# Patient Record
Sex: Female | Born: 1995 | Race: Black or African American | Hispanic: No | Marital: Single | Smoking: Current every day smoker
Health system: Southern US, Community
[De-identification: ages and names within clinical notes are randomized; demographics above are authoritative.]

## PROBLEM LIST (undated history)

## (undated) ENCOUNTER — Inpatient Hospital Stay (HOSPITAL_COMMUNITY): Payer: Self-pay

## (undated) DIAGNOSIS — Z789 Other specified health status: Secondary | ICD-10-CM

## (undated) HISTORY — PX: WISDOM TOOTH EXTRACTION: SHX21

---
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
## 2014-01-17 ENCOUNTER — Ambulatory Visit: Payer: Medicaid Other | Admitting: Pediatrics

## 2014-03-01 ENCOUNTER — Emergency Department (HOSPITAL_COMMUNITY): Payer: Medicaid Other

## 2014-03-01 ENCOUNTER — Emergency Department (HOSPITAL_COMMUNITY)
Admission: EM | Admit: 2014-03-01 | Discharge: 2014-03-01 | Disposition: A | Discharge status: Home / Self Care | Payer: Medicaid Other | Attending: Emergency Medicine | Admitting: Emergency Medicine

## 2014-03-01 ENCOUNTER — Encounter (HOSPITAL_COMMUNITY): Payer: Self-pay | Admitting: Emergency Medicine

## 2014-03-01 DIAGNOSIS — S99922A Unspecified injury of left foot, initial encounter: Secondary | ICD-10-CM | POA: Diagnosis present

## 2014-03-01 DIAGNOSIS — S92002A Unspecified fracture of left calcaneus, initial encounter for closed fracture: Secondary | ICD-10-CM | POA: Diagnosis not present

## 2014-03-01 DIAGNOSIS — Y998 Other external cause status: Secondary | ICD-10-CM | POA: Insufficient documentation

## 2014-03-01 DIAGNOSIS — Y9389 Activity, other specified: Secondary | ICD-10-CM | POA: Diagnosis not present

## 2014-03-01 DIAGNOSIS — Y9289 Other specified places as the place of occurrence of the external cause: Secondary | ICD-10-CM | POA: Diagnosis not present

## 2014-03-01 DIAGNOSIS — Z792 Long term (current) use of antibiotics: Secondary | ICD-10-CM | POA: Diagnosis not present

## 2014-03-01 DIAGNOSIS — R52 Pain, unspecified: Secondary | ICD-10-CM

## 2014-03-01 MED ORDER — HYDROCODONE-ACETAMINOPHEN 5-325 MG PO TABS: 1.0000 | ORAL_TABLET | ORAL | Status: DC | PRN

## 2014-03-01 MED ORDER — NAPROXEN 250 MG PO TABS
500.0000 mg | ORAL_TABLET | Freq: Once | ORAL | Status: DC
  Filled 2014-03-01: qty 2

## 2014-03-01 MED ORDER — HYDROCODONE-ACETAMINOPHEN 5-325 MG PO TABS
1.0000 | ORAL_TABLET | Freq: Once | ORAL | Status: AC
  Administered 2014-03-01: 1 via ORAL
  Filled 2014-03-01: qty 1

## 2014-03-01 MED ORDER — NAPROXEN 500 MG PO TABS
500.0000 mg | ORAL_TABLET | Freq: Two times a day (BID) | ORAL | Status: DC
Start: 2014-03-01 — End: 2014-10-07
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
## 2014-03-01 NOTE — ED Notes (Signed)
Ortho to apply splint to lt.

## 2014-03-01 NOTE — Progress Notes (Signed)
Orthopedic Tech Progress Note Patient Details:  Tracy BrunsJehnya L [name] 03/12/1996 [id number]  Ortho Devices Type of Ortho Device: Ace wrap, Lenora BoysWatson Jones splint, Crutches Ortho Device/Splint Location: LLE Ortho Device/Splint Interventions: Application   Asia R Thompson 03/01/2014, 11:41 AM

## 2014-03-01 NOTE — ED Notes (Signed)
Declined W/C at D/C and was escorted to lobby by RN. 

## 2014-03-01 NOTE — Discharge Instructions (Signed)
Follow directions provided. Be sure to establish care and follow-up with Dr. Dion SaucierLandau to follow-up with your healed fracture. Take the pain medicine as directed. Don't hesitate to return for any new, worsening, or concerning symptoms.  SEEK IMMEDIATE MEDICAL CARE IF:  You have fluid leaking through the cast.  You are unable to move your fingers or toes.  You have discolored (blue or white), cool, painful, or very swollen fingers or toes beyond the cast.  You have tingling or numbness around the injured area.  You have severe pain or pressure under the cast.  You have any difficulty with your breathing or have shortness of breath.  You have chest pain.

## 2014-03-03 ENCOUNTER — Emergency Department (HOSPITAL_COMMUNITY)
Admission: EM | Admit: 2014-03-03 | Discharge: 2014-03-03 | Disposition: A | Discharge status: Home / Self Care | Payer: Medicaid Other | Attending: Emergency Medicine | Admitting: Emergency Medicine

## 2014-03-03 ENCOUNTER — Encounter (HOSPITAL_COMMUNITY): Payer: Self-pay | Admitting: Family Medicine

## 2014-03-03 DIAGNOSIS — Z3202 Encounter for pregnancy test, result negative: Secondary | ICD-10-CM | POA: Insufficient documentation

## 2014-03-03 DIAGNOSIS — Z3200 Encounter for pregnancy test, result unknown: Secondary | ICD-10-CM

## 2014-03-03 DIAGNOSIS — B86 Scabies: Secondary | ICD-10-CM | POA: Diagnosis not present

## 2014-03-03 LAB — URINE MICROSCOPIC-ADD ON

## 2014-03-03 LAB — URINALYSIS, ROUTINE W REFLEX MICROSCOPIC
GLUCOSE, UA: NEGATIVE mg/dL
KETONES UR: 15 mg/dL — AB
Nitrite: NEGATIVE
PH: 5.5 (ref 5.0–8.0)
Protein, ur: 100 mg/dL — AB
SPECIFIC GRAVITY, URINE: 1.027 (ref 1.005–1.030)
Urobilinogen, UA: 0.2 mg/dL (ref 0.0–1.0)

## 2014-03-03 LAB — POC URINE PREG, ED: Preg Test, Ur: NEGATIVE

## 2014-03-03 MED ORDER — PERMETHRIN 5 % EX CREA: TOPICAL_CREAM | CUTANEOUS | Status: DC

## 2014-03-03 NOTE — ED Provider Notes (Signed)
CSN: [id number]     Arrival date & time 03/03/14  1215 History   First MD Initiated Contact with Patient 03/03/14 1232     Chief Complaint  Patient presents with  . Possible Pregnancy     (Consider location/radiation/quality/duration/timing/severity/associated sxs/prior Treatment) HPI Comments: Patient presents today requesting a pregnancy test.  She states that she took one at home one week ago, which was negative.  Her LMP was approximately 3 months ago.  She denies any abdominal pain, pelvic pain, urinary symptoms, vaginal discharge, fever, chills, nausea, vomiting, or diarrhea.  She is sexually active and has had unprotected sex.  She also reports that she has had a pruritic rash for the past 3 weeks.  Rash located in the web spaces of both hands, lateral lower back, and bilateral groin area.  She has not treated the rash with any medications prior to arrival.  No known contacts with similar rash.  No new soaps, detergents, lotions, or medications.  No swelling of the lips, tongue, or throat.  No SOB.  The history is provided by the patient.    Past Medical History  Diagnosis Date  . Medical history non-contributory    Past Surgical History  Procedure Laterality Date  . No past surgeries     History reviewed. No pertinent family history. History  Substance Use Topics  . Smoking status: Never Smoker   . Smokeless tobacco: Never Used  . Alcohol Use: No   OB History    No data available     Review of Systems  Skin: Positive for rash.  All other systems reviewed and are negative.     Allergies  Review of patient's allergies indicates no known allergies.  Home Medications   Prior to Admission medications   Medication Sig Start Date End Date Taking? Authorizing Provider  ciprofloxacin (CIPRO) 500 MG tablet Take 1 tablet (500 mg total) by mouth 2 (two) times daily. Patient not taking: Reported on 03/03/2014 12/31/13   Marny LowensteinJulie N Wenzel, PA-C  doxycycline (VIBRAMYCIN) 100  MG capsule Take 1 capsule (100 mg total) by mouth 2 (two) times daily. Patient not taking: Reported on 03/03/2014 12/26/13   Garlon HatchetLisa M Sanders, PA-C  HYDROcodone-acetaminophen (NORCO/VICODIN) 5-325 MG per tablet Take 1-2 tablets by mouth every 4 (four) hours as needed for moderate pain or severe pain. Patient not taking: Reported on 03/03/2014 03/01/14   Harle BattiestElizabeth Tysinger, NP  naproxen (NAPROSYN) 500 MG tablet Take 1 tablet (500 mg total) by mouth 2 (two) times daily with a meal. Patient not taking: Reported on 03/03/2014 03/01/14   Harle BattiestElizabeth Tysinger, NP   BP 115/70 mmHg  Pulse 89  Temp(Src) 98.6 F (37 C) (Oral)  Resp 18  Ht 5\' 2"  (1.575 m)  Wt 109 lb (49.442 kg)  BMI 19.93 kg/m2  SpO2 100%  LMP 12/02/2013 Physical Exam  Constitutional: She appears well-developed and well-nourished.  HENT:  Head: Normocephalic and atraumatic.  Mouth/Throat: Oropharynx is clear and moist.  Neck: Normal range of motion. Neck supple.  Cardiovascular: Normal rate, regular rhythm and normal heart sounds.   Pulmonary/Chest: Effort normal and breath sounds normal.  Abdominal: Soft. Bowel sounds are normal. She exhibits no distension and no mass. There is no tenderness. There is no rebound and no guarding.  Neurological: She is alert.  Skin: Skin is warm and dry.  Erythematous papular pruritic rash located in the groin area bilaterally and the web spaces of the fingers.  Also periumbilical and lateral lower back.  Psychiatric: She  has a normal mood and affect.  Nursing note and vitals reviewed.   ED Course  Procedures (including critical care time) Labs Review Labs Reviewed  URINALYSIS, ROUTINE W REFLEX MICROSCOPIC  POC URINE PREG, ED    Imaging Review No results found.   EKG Interpretation None      MDM   Final diagnoses:  None   Patient presents today requesting a pregnancy test and also a rash.  Pregnancy test today is negative.  She denies abdominal pain, pelvic pain, or vaginal  discharge.  She also has a pruritic rash located in the groin area bilaterally, web spaces of fingers, and lateral lower back.  Appearance and location most consistent with Scabies.  Feel discharged home with Rx for Permethrin cream.  Stable for discharge.  Return precautions given.      Santiago GladHeather Zekiah Coen, PA-C 03/03/14 2207  Juliet RudeNathan R. Rubin PayorPickering, MD 03/04/14 [phone]

## 2014-03-03 NOTE — ED Notes (Signed)
Pt sts she thinks she is pregnant. sts hasn't had a cycle in a few months. Denise any problems. sts also rash to bottom.

## 2014-03-05 LAB — URINE CULTURE: Colony Count: >=100000

## 2014-03-06 NOTE — Progress Notes (Signed)
ED Antimicrobial Stewardship Positive Culture Follow Up   Tracy BrunsJehnya L Allaire is an 18 y.o. female who [facility] with a chief complaint of  Chief Complaint  Patient presents with  . Possible Pregnancy    Recent Results (from the past 720 hour(s))  Urine culture     Status: None   Collection Time: 03/03/14  1:32 PM  Result Value Ref Range Status   Specimen Description URINE, CLEAN CATCH  Final   Special Requests NONE  Final   Culture  Setup Time   Final    03/03/2014 13:57 Performed at [facility]    Colony Count   Final    >=100,000 COLONIES/ML Performed at [facility]    Culture   Final    ESCHERICHIA COLI Performed at [facility]    Report Status 03/05/2014 FINAL  Final   Organism ID, Bacteria ESCHERICHIA COLI  Final      Susceptibility   Escherichia coli - MIC*    AMPICILLIN >=32 RESISTANT Resistant     CEFAZOLIN <=4 SENSITIVE Sensitive     CEFTRIAXONE <=1 SENSITIVE Sensitive     CIPROFLOXACIN <=0.25 SENSITIVE Sensitive     GENTAMICIN <=1 SENSITIVE Sensitive     LEVOFLOXACIN <=0.12 SENSITIVE Sensitive     NITROFURANTOIN <=16 SENSITIVE Sensitive     TOBRAMYCIN <=1 SENSITIVE Sensitive     TRIMETH/SULFA <=20 SENSITIVE Sensitive     PIP/TAZO <=4 SENSITIVE Sensitive     * ESCHERICHIA COLI    18yof presented with possible pregnancy. Pregnancy test was negative. Positive urine culture with Ecoli but UA revealed many epithelials and patient reported no UTI symptoms - most likely contamination, no treatment indicated.   ED Provider: Teressa LowerVrinda Pickering, FNP   Cleon Dewulaney,  Robert 03/06/2014, 1:30 PM Infectious Diseases Pharmacist Phone# [phone]

## 2014-05-19 ENCOUNTER — Emergency Department (HOSPITAL_COMMUNITY)
Admission: EM | Admit: 2014-05-19 | Discharge: 2014-05-19 | Disposition: A | Discharge status: Home / Self Care | Payer: Medicaid Other | Attending: Emergency Medicine | Admitting: Emergency Medicine

## 2014-05-19 ENCOUNTER — Encounter (HOSPITAL_COMMUNITY): Payer: Self-pay | Admitting: Emergency Medicine

## 2014-05-19 DIAGNOSIS — J029 Acute pharyngitis, unspecified: Secondary | ICD-10-CM | POA: Diagnosis present

## 2014-05-19 DIAGNOSIS — J028 Acute pharyngitis due to other specified organisms: Secondary | ICD-10-CM

## 2014-05-19 DIAGNOSIS — B9689 Other specified bacterial agents as the cause of diseases classified elsewhere: Secondary | ICD-10-CM

## 2014-05-19 MED ORDER — OXYCODONE HCL 5 MG/5ML PO SOLN
5.0000 mg | Freq: Once | ORAL | Status: AC
  Administered 2014-05-19: 5 mg via ORAL
  Filled 2014-05-19: qty 5

## 2014-05-19 MED ORDER — DEXAMETHASONE SODIUM PHOSPHATE 10 MG/ML IJ SOLN
10.0000 mg | Freq: Once | INTRAMUSCULAR | Status: AC
  Administered 2014-05-19: 10 mg via INTRAMUSCULAR
  Filled 2014-05-19: qty 1

## 2014-05-19 MED ORDER — OXYCODONE HCL 5 MG/5ML PO SOLN: 5.0000 mg | Freq: Four times a day (QID) | ORAL | Status: DC | PRN

## 2014-05-19 MED ORDER — PENICILLIN G BENZATHINE 1200000 UNIT/2ML IM SUSP
1.2000 10*6.[IU] | Freq: Once | INTRAMUSCULAR | Status: AC
  Administered 2014-05-19: 1.2 10*6.[IU] via INTRAMUSCULAR
  Filled 2014-05-19: qty 2

## 2014-05-19 MED ORDER — IBUPROFEN 400 MG PO TABS
800.0000 mg | ORAL_TABLET | Freq: Once | ORAL | Status: AC
  Administered 2014-05-19: 800 mg via ORAL
  Filled 2014-05-19: qty 2

## 2014-05-19 NOTE — Discharge Instructions (Signed)
1. Medications: Roxicodone, usual home medications 2. Treatment: rest, drink plenty of fluids, take tylenol or ibuprofen for fever control 3. Follow Up: Please followup with your primary doctor in 3 days for discussion of your diagnoses and further evaluation after today's visit; if you do not have a primary care doctor use the resource guide provided to find one; Return to the ER for high fevers, difficulty breathing, inability to swallow or other concerning symptoms   Pharyngitis Pharyngitis is redness, pain, and swelling (inflammation) of your pharynx.  CAUSES  Pharyngitis is usually caused by infection. Most of the time, these infections are from viruses (viral) and are part of a cold. However, sometimes pharyngitis is caused by bacteria (bacterial). Pharyngitis can also be caused by allergies. Viral pharyngitis may be spread from person to person by coughing, sneezing, and personal items or utensils (cups, forks, spoons, toothbrushes). Bacterial pharyngitis may be spread from person to person by more intimate contact, such as kissing.  SIGNS AND SYMPTOMS  Symptoms of pharyngitis include:   Sore throat.   Tiredness (fatigue).   Low-grade fever.   Headache.  Joint pain and muscle aches.  Skin rashes.  Swollen lymph nodes.  Plaque-like film on throat or tonsils (often seen with bacterial pharyngitis). DIAGNOSIS  Your health care provider will ask you questions about your illness and your symptoms. Your medical history, along with a physical exam, is often all that is needed to diagnose pharyngitis. Sometimes, a rapid strep test is done. Other lab tests may also be done, depending on the suspected cause.  TREATMENT  Viral pharyngitis will usually get better in 3-4 days without the use of medicine. Bacterial pharyngitis is treated with medicines that kill germs (antibiotics).  HOME CARE INSTRUCTIONS   Drink enough water and fluids to keep your urine clear or pale yellow.    Only take over-the-counter or prescription medicines as directed by your health care provider:   If you are prescribed antibiotics, make sure you finish them even if you start to feel better.   Do not take aspirin.   Get lots of rest.   Gargle with 8 oz of salt water ( tsp of salt per 1 qt of water) as often as every 1-2 hours to soothe your throat.   Throat lozenges (if you are not at risk for choking) or sprays may be used to soothe your throat. SEEK MEDICAL CARE IF:   You have large, tender lumps in your neck.  You have a rash.  You cough up green, yellow-brown, or bloody spit. SEEK IMMEDIATE MEDICAL CARE IF:   Your neck becomes stiff.  You drool or are unable to swallow liquids.  You vomit or are unable to keep medicines or liquids down.  You have severe pain that does not go away with the use of recommended medicines.  You have trouble breathing (not caused by a stuffy nose). MAKE SURE YOU:   Understand these instructions.  Will watch your condition.  Will get help right away if you are not doing well or get worse. Document Released: 02/28/2005 Document Revised: 12/19/2012 Document Reviewed: 11/05/2012 [facility] Patient Information 2015 [place], [place]. This information is not intended to replace advice given to you by your health care provider. Make sure you discuss any questions you have with your health care provider.    Emergency Department Resource Guide 1) Find a Doctor and Pay Out of Pocket Although you won't have to find out who is covered by your insurance plan, it is  a good idea to ask around and get recommendations. You will then need to call the office and see if the doctor you have chosen will accept you as a new patient and what types of options they offer for patients who are self-pay. Some doctors offer discounts or will set up payment plans for their patients who do not have insurance, but you will need to ask so you aren't surprised when you  get to your appointment.  2) Contact Your Local Health Department Not all health departments have doctors that can see patients for sick visits, but many do, so it is worth a call to see if yours does. If you don't know where your local health department is, you can check in your phone book. The CDC also has a tool to help you locate your state's health department, and many state websites also have listings of all of their local health departments.  3) Find a Walk-in Clinic If your illness is not likely to be very severe or complicated, you may want to try a walk in clinic. These are popping up all over the country in pharmacies, drugstores, and shopping centers. They're usually staffed by nurse practitioners or physician assistants that have been trained to treat common illnesses and complaints. They're usually fairly quick and inexpensive. However, if you have serious medical issues or chronic medical problems, these are probably not your best option.  No Primary Care Doctor: - Call Health Connect at  [phone] - they can help you locate a primary care doctor that  accepts your insurance, provides certain services, etc. - Physician Referral Service- [phone]  Chronic Pain Problems: Organization         Address  Phone   Notes  [facility]  [phone] Patients need to be referred by their primary care doctor.   Medication Assistance: Organization         Address  Phone   Notes  [facility] [address] [place], [place] [address] [phone] --Must be a resident of [facility] [place] -- Must have NO insurance coverage whatsoever (no Medicaid/ Medicare, etc.) -- The pt. MUST have a primary care doctor that directs their care regularly and follows them in the community   MedAssist  [phone]   [employer]  [phone]    Agencies that provide inexpensive medical care: Organization         Address  Phone    Notes  [facility]  [phone]   [facility]    [phone]   [facility] [address] [place], [place] [address] [phone]) [phone]   [facility] [address], [place] [phone]   [facility]anned Parenthood    [phone]   [facility]    [phone]   Community [facility] and [facility]  [address], [place] Phone:  [phone], Fax:  [phone]) [phone] Hours of Operation:  9 am - 6 pm, M-F.  Also accepts Medicaid/Medicare and self-pay.  [facility]  [address], [place] Phone: [phone]) [phone], Fax: [phone]) [phone]. Hours of Operation:  8:30 am - 5:30 pm, M-F.  Also accepts Medicaid and self-pay.  [facility] [place] [address], [place] Phone: [phone]) [phone]   [facility] [address], [place], [place] [phone], Ext. [phone] Mondays & Thursdays: 7-9 AM.  First 15 patients are seen on a first come, first serve basis.    Medicaid-accepting [facility] Providers:  Organization         Address  Phone   Notes  [facility] [address], [place] [phone] Also accepts self-pay patients.  [facility] [address] Laurell Josephs [place], [place]  [phone]   [facility] [address], [place] [phone]   [facility] [address], [place] [phone]   Renaye Rakers [address], [place]   [phone] Only accepts [place] Access [place] patients after they have their name applied to their card.   Self-Pay (no insurance) in [facility]:  Organization         Address  Phone   Notes  Sickle Cell Patients, [facility] [address] [place], [place] [phone]   [facility] [address] [place], [place] [phone]   [facility] [place]  [address], [place] [phone]   [facility]/Dr. Osei-Bonsu  [address], [place] or [address], [place] [phone] Phone number for both [place] and [place] locations is the same.  Urgent Medical and [facility] [address] [place] [phone]   [facility] [address] [place] or [address] [phone]   [facility] [address], [place] [phone], phone; [phone], fax Sees patients 1st and 3rd Saturday of every month.  Must not qualify for public or private insurance (i.e. Medicaid, Medicare, [place] Health Choice, Veterans' Benefits)  Household income should be no more than 200% of the poverty level The clinic cannot treat you if you are pregnant or think you are pregnant  Sexually transmitted diseases are not treated at the clinic.    Dental Care: Organization         Address  Phone  Notes  [facility] [address] [facility] of [facility] [address] [place], [place] [phone] Accepts children up to age 17 who are enrolled in [place] or [place] Health Choice; pregnant women with a Medicaid card; and children who have applied for Medicaid or [place] Health Choice, but were declined, whose parents can pay a reduced fee at time of service.  [facility]  [address], [place] [phone] Accepts children up to age 73 who are enrolled in [place] or [place] Health Choice; pregnant women with a Medicaid card; and children who have applied for Medicaid or [place] Health Choice, but were declined, whose parents can pay a reduced fee at time of service.  [facility]  [address] [place], [place] [phone] Patients are seen by appointment only. Walk-ins are not accepted. [facility] will see patients 62 years of age  and older. Monday - Tuesday (8am-5pm) Most Wednesdays (8:30-5pm) $30 per visit, cash only  [facility] Access PRO[facility]  [address], [facility] [phone] Patients are seen by appointment only. Walk-ins are not accepted. [facility] will see patients 37 years of age and older. One Wednesday Evening (Monthly: Volunteer Based).  $30 per visit, cash only  [employer]  [phone] for adults; Children under age 36, call Graduate  Pediatric Dentistry at [phone]. Children aged 44-14, please call [phone] to request a pediatric application.  Dental services are provided in all areas of dental care including fillings, crowns and bridges, complete and partial dentures, implants, gum treatment, root canals, and extractions. Preventive care is also provided. Treatment is provided to both adults and children. Patients are selected via a lottery and there is often a waiting list.   [facility] [address], [place]  [phone] [email or web address]   [facility] [address] [place], [place] [phone], Ext. [phone] Second and Fourth Thursday of each month, opens at 6:30 AM; Clinic ends at 9 AM.  Patients are seen on a first-come first-served basis, and a limited number are seen during each clinic.   [facility]  [address] Ether Griffins [place], [place] [phone]   Eligibility Requirements You must have lived in [place], [place], or [place] counties for at least the last three months.   You cannot be eligible for state or federal sponsored [place], including [employer], [place], or [employer].   You generally cannot be eligible for healthcare insurance through your employer.    How to apply: Eligibility screenings are held every Tuesday and Wednesday afternoon from 1:00 pm until 4:00 pm. You do not need an appointment for the interview!  [facility] [address], [place], [place] [phone]   [facility] Health Department  [phone]   [facility] Health Department  [phone]   [facility] Health Department  [phone]    Behavioral Health Resources in the Community: Intensive Outpatient Programs Organization         Address  Phone  Notes  [facility] [address] [place], [place] [phone]   [facility] Outpatient [address], [place], [place] [phone]   [facility] [address] [place], [place]  [phone]   [facility] [address],  [place], [place] [phone] or [phone]   Substance Abuse Resources Organization         Address  Phone  Notes  [facility] Drug Services  [phone]   [facility]  [phone]   [facility] [place]  [phone]   Floydene Flock  [phone]   Residential & Outpatient Substance Abuse Program  [phone]   Psychological Services Organization         Address  Phone  Notes  [facility]  [phone]   [facility]  [phone]   [facility] [address], [place] [phone] or [phone]    Mobile Crisis Teams Organization         Address  Phone  Notes  [facility], Mobile Crisis Care Unit  [phone]   [facility]  [address] [place], [place] [phone]   Doristine Locks [address] [place] [phone]    Self-Help/Support Groups Organization         Address  Phone             Notes  Mental Health Assoc. of  - variety of support groups  [phone]- I7437963 Call for more information  Narcotics Anonymous (NA), [facility] [address], [employer] [place]  2 meetings at this location   Statistician         Address  Phone  Notes  [facility] [address],  [place]  [phone]   [facility]  [address] [place] [address], [place], [place] [phone]   [facility] [address] [place], [place] [phone] Admissions: 8am-3pm M-F  [facility] [address],    [place], [place] [phone]   [facility] [address] [place], [place], [place] [phone]   [facility] [address],  [place], [place] [phone]   [facility] - Intensive Outpatient [address], [place], [place] [phone]   [facility] ([facility] [address]  [place], [place] [phone] or [phone]   [facility] ([facility]) [address] [place], [place] [phone] Accepts Medicaid  [facility] [address]  [place] [phone] Substance Abuse/Addiction Treatment   [facility] Organization         Address  Phone  Notes  [facility]  [phone]   Angie Fava, PhD [address] Ervin Knack [place], [place]   [phone] or [phone]   [facility] Behavioral   [address] [place], [place] [phone]   [facility] [address] [place], [place] [phone] Insurance/Medicaid/sponsorship through [facility] Families [address]                                    [place], [place] [phone] Therapy/tele-psych/case  [facility] [address], [place] [phone]    Dr. Lolly Mustache  [phone]   [facility]  [employer] [facility] Dept. 1) [address] [place] 2) [address] [place] 3)  [address], [place] [phone]  [phone]   [facility] Child Abuse Hotline 831-835-6307 or [phone] (After Hours)

## 2014-05-19 NOTE — ED Notes (Signed)
Pt reports sore throat and cough x 2 days with chills. Pt speaking in full sentences, airway intact.

## 2014-05-19 NOTE — ED Notes (Signed)
Pt stable, pain improved, ambulatory, refused wheelchair, friend is driving home.

## 2014-05-19 NOTE — ED Provider Notes (Signed)
CSN: [id number]     Arrival date & time 05/19/14  1554 History   First MD Initiated Contact with Patient 05/19/14 1630     Chief Complaint  Patient presents with  . Sore Throat     (Consider location/radiation/quality/duration/timing/severity/associated sxs/prior Treatment) Patient is a 19 y.o. female presenting with pharyngitis. The history is provided by the patient and medical records. No language interpreter was used.  Sore Throat Associated symptoms include fatigue, headaches and a sore throat. Pertinent negatives include no abdominal pain, arthralgias, chest pain, chills, congestion, coughing, fever, myalgias, nausea, numbness, rash or vomiting.    Tracy BrunsJehnya L [name] is a 19 y.o. female  with a hx of no major medical problems presents to the Emergency Department complaining of gradual, persistent, progressively worsening sore throat onset 2 days ago.  Pt reports she her throat pain has worsened throughout the last 24 hours and it is very painful to swallow, but she is able to do so.  Pt endorses subjective chills, but has not measured her temperature.  She endorses associated mild generalized headache and bilateral otalgia.  RN note reports that she has a cough, but pt reports she has only coughed a couple of times in the last few days.  Pt denies treatment PTA.  She denies changes in voice, neck stiffness, neck pain, nausea and vomiting.  She denies other URI symptoms including nasal congestion, sinus pressure and post nasal drip.    Past Medical History  Diagnosis Date  . Medical history non-contributory    Past Surgical History  Procedure Laterality Date  . No past surgeries     No family history on file. History  Substance Use Topics  . Smoking status: Never Smoker   . Smokeless tobacco: Never Used  . Alcohol Use: No   OB History    No data available     Review of Systems  Constitutional: Positive for fatigue. Negative for fever, chills and appetite change.  HENT:  Positive for ear pain, sore throat and trouble swallowing. Negative for congestion, ear discharge, mouth sores, postnasal drip, rhinorrhea and sinus pressure.   Eyes: Negative for visual disturbance.  Respiratory: Negative for cough, chest tightness, shortness of breath, wheezing and stridor.   Cardiovascular: Negative for chest pain, palpitations and leg swelling.  Gastrointestinal: Negative for nausea, vomiting, abdominal pain and diarrhea.  Genitourinary: Negative for dysuria, urgency, frequency and hematuria.  Musculoskeletal: Negative for myalgias, back pain, arthralgias and neck stiffness.  Skin: Negative for rash.  Neurological: Positive for headaches. Negative for syncope, light-headedness and numbness.  Hematological: Negative for adenopathy.  Psychiatric/Behavioral: The patient is not nervous/anxious.   All other systems reviewed and are negative.     Allergies  Review of patient's allergies indicates no known allergies.  Home Medications   Prior to Admission medications   Medication Sig Start Date End Date Taking? Authorizing Provider  ciprofloxacin (CIPRO) 500 MG tablet Take 1 tablet (500 mg total) by mouth 2 (two) times daily. Patient not taking: Reported on 03/03/2014 12/31/13   Marny LowensteinJulie N Wenzel, PA-C  doxycycline (VIBRAMYCIN) 100 MG capsule Take 1 capsule (100 mg total) by mouth 2 (two) times daily. Patient not taking: Reported on 03/03/2014 12/26/13   Garlon HatchetLisa M Sanders, PA-C  HYDROcodone-acetaminophen (NORCO/VICODIN) 5-325 MG per tablet Take 1-2 tablets by mouth every 4 (four) hours as needed for moderate pain or severe pain. Patient not taking: Reported on 03/03/2014 03/01/14   Harle BattiestElizabeth Tysinger, NP  naproxen (NAPROSYN) 500 MG tablet Take 1 tablet (500  mg total) by mouth 2 (two) times daily with a meal. Patient not taking: Reported on 03/03/2014 03/01/14   Harle Battiest, NP  oxyCODONE (ROXICODONE) 5 MG/5ML solution Take 5 mLs (5 mg total) by mouth every 6 (six) hours  as needed for severe pain. 05/19/14   Paytyn Mesta, PA-C  permethrin (ELIMITE) 5 % cream Apply to affected area once.  Leave the cream on for 12 hours and then rinse off. 03/03/14   Heather Laisure, PA-C   BP 110/62 mmHg  Pulse 98  Temp(Src) 98.6 F (37 C) (Oral)  Resp 16  Ht  (1.626 m)  Wt 110 lb (49.896 kg)  BMI 18.87 kg/m2  SpO2 100%  LMP 04/12/2014 Physical Exam  Constitutional: She appears well-developed and well-nourished. No distress.  HENT:  Head: Normocephalic and atraumatic.  Right Ear: Tympanic membrane, external ear and ear canal normal.  Left Ear: Tympanic membrane, external ear and ear canal normal.  Nose: Nose normal. No mucosal edema or rhinorrhea.  Mouth/Throat: Uvula is midline and mucous membranes are normal. Mucous membranes are not dry. No trismus in the jaw. No uvula swelling. Oropharyngeal exudate, posterior oropharyngeal edema and posterior oropharyngeal erythema present. No tonsillar abscesses.  Posterior oropharynx with erythema, edema and exudate on the tonsils  Eyes: Conjunctivae are normal.  Neck: Normal range of motion, full passive range of motion without pain and phonation normal. No tracheal tenderness, no spinous process tenderness and no muscular tenderness present. No rigidity. No erythema and normal range of motion present. No Brudzinski's sign and no Kernig's sign noted.  Range of motion without pain no No midline or paraspinal tenderness Normal phonation No stridor Handling secretions without difficulty No nuchal rigidity or meningeal signs  Cardiovascular: Normal rate, regular rhythm and normal heart sounds.   Pulses:      Radial pulses are 2+ on the right side, and 2+ on the left side.  Pulmonary/Chest: Effort normal and breath sounds normal. No stridor. No respiratory distress. She has no decreased breath sounds. She has no wheezes.  Equal chest expansion, clear and equal breath sounds without focal wheezes, rhonchi or rales   Musculoskeletal: Normal range of motion.  Lymphadenopathy:       Head (right side): Submandibular and tonsillar adenopathy present. No submental, no preauricular, no posterior auricular and no occipital adenopathy present.       Head (left side): Submandibular and tonsillar adenopathy present. No submental, no preauricular, no posterior auricular and no occipital adenopathy present.    She has cervical adenopathy (bilateral, superficial and anterior).       Right cervical: No superficial cervical, no deep cervical and no posterior cervical adenopathy present.      Left cervical: No superficial cervical, no deep cervical and no posterior cervical adenopathy present.  Neurological: She is alert.  Alert and oriented Moves all extremities without ataxia  Skin: Skin is warm and dry. She is not diaphoretic.  Psychiatric: She has a normal mood and affect.  Nursing note and vitals reviewed.   ED Course  Procedures (including critical care time) Labs Review Labs Reviewed - No data to display  Imaging Review No results found.   EKG Interpretation None      MDM   Final diagnoses:  Bacterial pharyngitis   Tracy [name] presents with Hx and PE consistent with Strep pharyngitis.  Pt subjective fever with tonsillar exudate, cervical lymphadenopathy, absence of cough, dysphagia; diagnosis of strep.  Pt does not wish for a rapid strep test, but  simply treatment in the ED.  Treated in the Ed with steroids, NSAIDs, Pain medication and PCN IM.  Pt appears mildly dehydrated, discussed importance of water rehydration. Presentation non concerning for PTA or infxn spread to soft tissue. No trismus or uvula deviation. Specific return precautions discussed. Pt able to drink water in ED without difficulty with intact air way. Recommended PCP follow up.   I have personally reviewed patient's vitals, nursing note and any pertinent labs or imaging.  I performed an focused physical exam; undressed when  appropriate .    It has been determined that no acute conditions requiring further emergency intervention are present at this time. The patient/guardian have been advised of the diagnosis and plan. I reviewed any labs and imaging including any potential incidental findings. We have discussed signs and symptoms that warrant return to the ED and they are listed in the discharge instructions.    Vital signs are stable at discharge.   BP 110/62 mmHg  Pulse 98  Temp(Src) 98.6 F (37 C) (Oral)  Resp 16  Ht  (1.626 m)  Wt 110 lb (49.896 kg)  BMI 18.87 kg/m2  SpO2 100%  LMP 04/12/2014        Dahlia Client Cailen Mihalik, PA-C 05/19/14 1855  Jerelyn Scott, MD 05/19/14 1901

## 2014-06-17 ENCOUNTER — Emergency Department (HOSPITAL_COMMUNITY)
Admission: EM | Admit: 2014-06-17 | Discharge: 2014-06-17 | Disposition: A | Discharge status: Home / Self Care | Payer: Medicaid Other | Attending: Emergency Medicine | Admitting: Emergency Medicine

## 2014-06-17 ENCOUNTER — Encounter (HOSPITAL_COMMUNITY): Payer: Self-pay | Admitting: Emergency Medicine

## 2014-06-17 DIAGNOSIS — Z792 Long term (current) use of antibiotics: Secondary | ICD-10-CM | POA: Insufficient documentation

## 2014-06-17 DIAGNOSIS — Z79899 Other long term (current) drug therapy: Secondary | ICD-10-CM | POA: Diagnosis not present

## 2014-06-17 DIAGNOSIS — Y998 Other external cause status: Secondary | ICD-10-CM | POA: Diagnosis not present

## 2014-06-17 DIAGNOSIS — Y9389 Activity, other specified: Secondary | ICD-10-CM | POA: Insufficient documentation

## 2014-06-17 DIAGNOSIS — W010XXA Fall on same level from slipping, tripping and stumbling without subsequent striking against object, initial encounter: Secondary | ICD-10-CM | POA: Insufficient documentation

## 2014-06-17 DIAGNOSIS — Y9289 Other specified places as the place of occurrence of the external cause: Secondary | ICD-10-CM | POA: Insufficient documentation

## 2014-06-17 DIAGNOSIS — S63501A Unspecified sprain of right wrist, initial encounter: Secondary | ICD-10-CM

## 2014-06-17 DIAGNOSIS — S6991XA Unspecified injury of right wrist, hand and finger(s), initial encounter: Secondary | ICD-10-CM | POA: Diagnosis present

## 2014-06-17 MED ORDER — IBUPROFEN 400 MG PO TABS: 400.0000 mg | ORAL_TABLET | Freq: Three times a day (TID) | ORAL | Status: DC | PRN

## 2014-06-17 NOTE — Discharge Instructions (Signed)
Take ibuprofen as prescribed. Ice and elevate your wrist.  Sprain A sprain is a tear in one of the strong, fibrous tissues that connect your bones (ligaments). The severity of the sprain depends on how much of the ligament is torn. The tear can be either partial or complete. CAUSES  Often, sprains are a result of a fall or an injury. The force of the impact causes the fibers of your ligament to stretch beyond their normal length. This excess tension causes the fibers of your ligament to tear. SYMPTOMS  You may have some loss of motion or increased pain within your normal range of motion. Other symptoms include:  Bruising.  Tenderness.  Swelling. DIAGNOSIS  In order to diagnose a sprain, your caregiver will physically examine you to determine how torn the ligament is. Your caregiver may also suggest an X-ray exam to make sure no bones are broken. TREATMENT  If your ligament is only partially torn, treatment usually involves keeping the injured area in a fixed position (immobilization) for a short period. To do this, your caregiver will apply a bandage, cast, or splint to keep the area from moving until it heals. For a partially torn ligament, the healing process usually takes 2 to 3 weeks. If your ligament is completely torn, you may need surgery to reconnect the ligament to the bone or to reconstruct the ligament. After surgery, a cast or splint may be applied and will need to stay on for 4 to 6 weeks while your ligament heals. HOME CARE INSTRUCTIONS  Keep the injured area elevated to decrease swelling.  To ease pain and swelling, apply ice to your joint twice a day, for 2 to 3 days.  Put ice in a plastic bag.  Place a towel between your skin and the bag.  Leave the ice on for 15 minutes.  Only take over-the-counter or prescription medicine for pain as directed by your caregiver.  Do not leave the injured area unprotected until pain and stiffness go away (usually 3 to 4 weeks).  Do  not allow your cast or splint to get wet. Cover your cast or splint with a plastic bag when you shower or bathe. Do not swim.  Your caregiver may suggest exercises for you to do during your recovery to prevent or limit permanent stiffness. SEEK IMMEDIATE MEDICAL CARE IF:  Your cast or splint becomes damaged.  Your pain becomes worse. MAKE SURE YOU:  Understand these instructions.  Will watch your condition.  Will get help right away if you are not doing well or get worse. Document Released: 02/26/2000 Document Revised: 05/23/2011 Document Reviewed: 03/12/2011 [facility] Patient Information 2015 [employer], [place]. This information is not intended to replace advice given to you by your health care provider. Make sure you discuss any questions you have with your health care provider.

## 2014-06-17 NOTE — ED Notes (Signed)
Declined W/C at D/C and was escorted to lobby by RN. 

## 2014-06-17 NOTE — ED Provider Notes (Signed)
CSN: [id number]     Arrival date & time 06/17/14  1411 History  This chart was scribed for non-physician practitioner, Celene Skeenobyn Anyely Cunning, PA-C working with Layla MawKristen N Ward, DO by Greggory StallionKayla Andersen, ED scribe. This patient was seen in room TR04C/TR04C and the patient's care was started at 2:44 PM.   Chief Complaint  Patient presents with  . Wrist Pain   The history is provided by the patient. No language interpreter was used.    HPI Comments: Tracy [name] is a 19 y.o. female who presents to the Emergency Department complaining of a fall that occurred yesterday. Pt was playing outside, tripped, and fell onto her right wrist. She reports sudden onset right wrist pain that is worse with movement. There are no alleviating factors. She has not yet taken any medications.   Past Medical History  Diagnosis Date  . Medical history non-contributory    Past Surgical History  Procedure Laterality Date  . No past surgeries     History reviewed. No pertinent family history. History  Substance Use Topics  . Smoking status: Never Smoker   . Smokeless tobacco: Never Used  . Alcohol Use: No   OB History    No data available     Review of Systems  Musculoskeletal: Positive for arthralgias.  All other systems reviewed and are negative.  Allergies  Review of patient's allergies indicates no known allergies.  Home Medications   Prior to Admission medications   Medication Sig Start Date End Date Taking? Authorizing Provider  ciprofloxacin (CIPRO) 500 MG tablet Take 1 tablet (500 mg total) by mouth 2 (two) times daily. Patient not taking: Reported on 03/03/2014 12/31/13   Marny LowensteinJulie N Wenzel, PA-C  doxycycline (VIBRAMYCIN) 100 MG capsule Take 1 capsule (100 mg total) by mouth 2 (two) times daily. Patient not taking: Reported on 03/03/2014 12/26/13   Garlon HatchetLisa M Sanders, PA-C  HYDROcodone-acetaminophen (NORCO/VICODIN) 5-325 MG per tablet Take 1-2 tablets by mouth every 4 (four) hours as needed for moderate pain  or severe pain. Patient not taking: Reported on 03/03/2014 03/01/14   Harle BattiestElizabeth Tysinger, NP  ibuprofen (ADVIL,MOTRIN) 400 MG tablet Take 1 tablet (400 mg total) by mouth every 8 (eight) hours as needed. 06/17/14   Kathrynn Speedobyn M Grabiel Schmutz, PA-C  naproxen (NAPROSYN) 500 MG tablet Take 1 tablet (500 mg total) by mouth 2 (two) times daily with a meal. Patient not taking: Reported on 03/03/2014 03/01/14   Harle BattiestElizabeth Tysinger, NP  oxyCODONE (ROXICODONE) 5 MG/5ML solution Take 5 mLs (5 mg total) by mouth every 6 (six) hours as needed for severe pain. 05/19/14   Hannah Muthersbaugh, PA-C  permethrin (ELIMITE) 5 % cream Apply to affected area once.  Leave the cream on for 12 hours and then rinse off. 03/03/14   Heather Laisure, PA-C   BP 111/61 mmHg  Pulse 66  Temp(Src) 98.2 F (36.8 C) (Oral)  Resp 18  SpO2 100%  LMP 04/12/2014   Physical Exam  Constitutional: She is oriented to person, place, and time. She appears well-developed and well-nourished. No distress.  HENT:  Head: Normocephalic and atraumatic.  Mouth/Throat: Oropharynx is clear and moist.  Eyes: Conjunctivae and EOM are normal.  Neck: Normal range of motion. Neck supple.  Cardiovascular: Normal rate, regular rhythm and normal heart sounds.   Pulses:      Radial pulses are 2+ on the right side.  Pulmonary/Chest: Effort normal and breath sounds normal. No respiratory distress.  Musculoskeletal: Normal range of motion. She exhibits no edema.  Mild tenderness to palpation over carpal bones. No swelling or deformity. Full ROM without pain. No snuff box tenderness.   Neurological: She is alert and oriented to person, place, and time. No sensory deficit.  Skin: Skin is warm and dry.  Psychiatric: She has a normal mood and affect. Her behavior is normal.  Nursing note and vitals reviewed.   ED Course  Procedures (including critical care time)  DIAGNOSTIC STUDIES: Oxygen Saturation is 100% on RA, normal by my interpretation.    COORDINATION OF  CARE: 2:46 PM-Discussed treatment plan which includes ibuprofen and ice with pt at bedside and pt agreed to plan.   Labs Review Labs Reviewed - No data to display  Imaging Review No results found.   EKG Interpretation None      MDM   Final diagnoses:  Right wrist sprain, initial encounter  Fall from slip, trip, or stumble, initial encounter   Neurovascularly intact. No swelling or deformity. Full range of motion without pain. I do not feel imaging studies are necessary at this time. Advised RICE, NSAIDs. Stable for discharge. Return precautions given. Patient states understanding of treatment care plan and is agreeable.  I personally performed the services described in this documentation, which was scribed in my presence. The recorded information has been reviewed and is accurate.  Kathrynn Speed, PA-C 06/17/14 1455  Kristen N Ward, DO 06/17/14 1506

## 2014-06-17 NOTE — ED Notes (Signed)
Pt sts right wrist pain after falling yesterday

## 2014-07-01 ENCOUNTER — Encounter (HOSPITAL_COMMUNITY): Payer: Self-pay | Admitting: Emergency Medicine

## 2014-07-01 ENCOUNTER — Emergency Department (HOSPITAL_COMMUNITY)
Admission: EM | Admit: 2014-07-01 | Discharge: 2014-07-01 | Disposition: A | Discharge status: Home / Self Care | Payer: Medicaid Other | Attending: Emergency Medicine | Admitting: Emergency Medicine

## 2014-07-01 DIAGNOSIS — J302 Other seasonal allergic rhinitis: Secondary | ICD-10-CM | POA: Diagnosis not present

## 2014-07-01 DIAGNOSIS — R21 Rash and other nonspecific skin eruption: Secondary | ICD-10-CM

## 2014-07-01 MED ORDER — SALINE SPRAY 0.65 % NA SOLN: 1.0000 | NASAL | Status: DC | PRN

## 2014-07-01 MED ORDER — CETIRIZINE HCL 10 MG PO TABS: 10.0000 mg | ORAL_TABLET | Freq: Every day | ORAL | Status: DC

## 2014-07-01 NOTE — Discharge Instructions (Signed)

## 2014-07-01 NOTE — ED Provider Notes (Signed)
CSN: [id number]     Arrival date & time 07/01/14  2100 History  This chart was scribed for non-physician practitioner Roxy Horseman, PA-C working with Arby Barrette, MD by Littie Deeds, ED Scribe. This patient was seen in room TR05C/TR05C and the patient's care was started at 9:21 PM.    Chief Complaint  Patient presents with  . Rash   The history is provided by the patient. No language interpreter was used.   HPI Comments: Tracy [name] is a 19 y.o. female who presents to the Emergency Department complaining of gradual onset rash to her face, around her nose and under her eyes, that started within the past 1-2 days. Patient also reports having eye puffiness and nasal congestion. She does report having had a subjective fever earlier today, which did resolve. She thinks her symptoms may be due to allergies. Patient denies sore throat currently, although she did have sore throat which resolved.   Past Medical History  Diagnosis Date  . Medical history non-contributory    Past Surgical History  Procedure Laterality Date  . No past surgeries     No family history on file. History  Substance Use Topics  . Smoking status: Never Smoker   . Smokeless tobacco: Never Used  . Alcohol Use: No   OB History    No data available     Review of Systems  Constitutional: Negative for fever and chills.  HENT: Positive for congestion. Negative for sore throat.   Respiratory: Negative for shortness of breath.   Cardiovascular: Negative for chest pain.  Gastrointestinal: Negative for nausea, vomiting, diarrhea and constipation.  Genitourinary: Negative for dysuria.  Skin: Positive for rash.  Allergic/Immunologic: Positive for environmental allergies.      Allergies  Review of patient's allergies indicates no known allergies.  Home Medications   Prior to Admission medications   Medication Sig Start Date End Date Taking? Authorizing Provider  ciprofloxacin (CIPRO) 500 MG tablet Take  1 tablet (500 mg total) by mouth 2 (two) times daily. Patient not taking: Reported on 03/03/2014 12/31/13   Marny Lowenstein, PA-C  doxycycline (VIBRAMYCIN) 100 MG capsule Take 1 capsule (100 mg total) by mouth 2 (two) times daily. Patient not taking: Reported on 03/03/2014 12/26/13   Garlon Hatchet, PA-C  HYDROcodone-acetaminophen (NORCO/VICODIN) 5-325 MG per tablet Take 1-2 tablets by mouth every 4 (four) hours as needed for moderate pain or severe pain. Patient not taking: Reported on 03/03/2014 03/01/14   Harle Battiest, NP  ibuprofen (ADVIL,MOTRIN) 400 MG tablet Take 1 tablet (400 mg total) by mouth every 8 (eight) hours as needed. 06/17/14   Kathrynn Speed, PA-C  naproxen (NAPROSYN) 500 MG tablet Take 1 tablet (500 mg total) by mouth 2 (two) times daily with a meal. Patient not taking: Reported on 03/03/2014 03/01/14   Harle Battiest, NP  oxyCODONE (ROXICODONE) 5 MG/5ML solution Take 5 mLs (5 mg total) by mouth every 6 (six) hours as needed for severe pain. 05/19/14   Hannah Muthersbaugh, PA-C  permethrin (ELIMITE) 5 % cream Apply to affected area once.  Leave the cream on for 12 hours and then rinse off. 03/03/14   Heather Laisure, PA-C   BP 115/71 mmHg  Pulse 68  Temp(Src) 98.3 F (36.8 C) (Oral)  Resp 14  Ht  (1.626 m)  Wt 117 lb (53.071 kg)  BMI 20.07 kg/m2  SpO2 97%  LMP 06/25/2014 (Approximate) Physical Exam  Constitutional: She is oriented to person, place, and time. She  appears well-developed and well-nourished. No distress.  HENT:  Head: Normocephalic and atraumatic.  Right Ear: External ear normal.  Left Ear: External ear normal.  Mouth/Throat: Oropharynx is clear and moist. No oropharyngeal exudate.  Mildly erythematous, no tonsillar exudate, no abscess, no stridor, uvula is midline  TMs clear bilaterally  Eyes: Conjunctivae and EOM are normal. Pupils are equal, round, and reactive to light.  Neck: Normal range of motion. Neck supple.  Cardiovascular: Normal  rate, regular rhythm and normal heart sounds.  Exam reveals no gallop and no friction rub.   No murmur heard. Pulmonary/Chest: Effort normal and breath sounds normal. No stridor. No respiratory distress. She has no wheezes. She has no rales. She exhibits no tenderness.  CTAB  Abdominal: Soft. Bowel sounds are normal. She exhibits no distension. There is no tenderness.  Musculoskeletal: Normal range of motion. She exhibits no edema or tenderness.  Neurological: She is alert and oriented to person, place, and time. No cranial nerve deficit.  Skin: Skin is warm and dry. No rash noted. She is not diaphoretic.  Psychiatric: She has a normal mood and affect. Her behavior is normal. Judgment and thought content normal.  Nursing note and vitals reviewed.   ED Course  Procedures  DIAGNOSTIC STUDIES: Oxygen Saturation is 97% on room air, normal by my interpretation.    COORDINATION OF CARE: 9:23 PM-Discussed treatment plan which includes medications with pt at bedside and pt agreed to plan.    Labs Review Labs Reviewed - No data to display  Imaging Review No results found.   EKG Interpretation None      MDM   Final diagnoses:  Rash  Seasonal allergies    Patient with allergic rhinitis, mild rash on face, no evidence of infection, cellulitis, or other acute or emergent process. Will treat with Zyrtec and nasal saline. Discharge to home.  I personally performed the services described in this documentation, which was scribed in my presence. The recorded information has been reviewed and is accurate.  Filed Vitals:   07/01/14 2114  BP: 115/71  Pulse: 68  Temp: 98.3 F (36.8 C)  Resp: [address]14     Tami Barren, PA-C 07/01/14 2128  Arby BarretteMarcy Pfeiffer, MD 07/09/14 [phone]

## 2014-07-01 NOTE — ED Notes (Addendum)
Patient with facial rash around nose and under eyes.  She does have some nasal congestion.  No tongue swelling, no shortness of breath at this time.

## 2014-09-30 ENCOUNTER — Encounter (HOSPITAL_COMMUNITY): Payer: Self-pay | Admitting: Physical Medicine and Rehabilitation

## 2014-09-30 ENCOUNTER — Emergency Department (HOSPITAL_COMMUNITY)
Admission: EM | Admit: 2014-09-30 | Discharge: 2014-09-30 | Disposition: A | Discharge status: Home / Self Care | Payer: Medicaid Other | Attending: Emergency Medicine | Admitting: Emergency Medicine

## 2014-09-30 DIAGNOSIS — R509 Fever, unspecified: Secondary | ICD-10-CM | POA: Diagnosis not present

## 2014-09-30 DIAGNOSIS — Z79899 Other long term (current) drug therapy: Secondary | ICD-10-CM | POA: Diagnosis not present

## 2014-09-30 DIAGNOSIS — J029 Acute pharyngitis, unspecified: Secondary | ICD-10-CM | POA: Insufficient documentation

## 2014-09-30 DIAGNOSIS — R1084 Generalized abdominal pain: Secondary | ICD-10-CM | POA: Diagnosis not present

## 2014-09-30 DIAGNOSIS — M549 Dorsalgia, unspecified: Secondary | ICD-10-CM | POA: Insufficient documentation

## 2014-09-30 DIAGNOSIS — R05 Cough: Secondary | ICD-10-CM | POA: Insufficient documentation

## 2014-09-30 DIAGNOSIS — N926 Irregular menstruation, unspecified: Secondary | ICD-10-CM | POA: Insufficient documentation

## 2014-09-30 DIAGNOSIS — R197 Diarrhea, unspecified: Secondary | ICD-10-CM | POA: Insufficient documentation

## 2014-09-30 DIAGNOSIS — R1030 Lower abdominal pain, unspecified: Secondary | ICD-10-CM | POA: Diagnosis present

## 2014-09-30 LAB — CBC WITH DIFFERENTIAL/PLATELET
BASOS PCT: 1 % (ref 0–1)
Basophils Absolute: 0 10*3/uL (ref 0.0–0.1)
EOS PCT: 2 % (ref 0–5)
Eosinophils Absolute: 0.1 10*3/uL (ref 0.0–0.7)
HEMATOCRIT: 40.6 % (ref 36.0–46.0)
HEMOGLOBIN: 13.4 g/dL (ref 12.0–15.0)
LYMPHS ABS: 2 10*3/uL (ref 0.7–4.0)
Lymphocytes Relative: 25 % (ref 12–46)
MCH: 30.7 pg (ref 26.0–34.0)
MCHC: 33 g/dL (ref 30.0–36.0)
MCV: 93.1 fL (ref 78.0–100.0)
MONO ABS: 0.7 10*3/uL (ref 0.1–1.0)
MONOS PCT: 9 % (ref 3–12)
Neutro Abs: 5.1 10*3/uL (ref 1.7–7.7)
Neutrophils Relative %: 63 % (ref 43–77)
Platelets: 236 10*3/uL (ref 150–400)
RBC: 4.36 MIL/uL (ref 3.87–5.11)
RDW: 12.9 % (ref 11.5–15.5)
WBC: 8 10*3/uL (ref 4.0–10.5)

## 2014-09-30 LAB — COMPREHENSIVE METABOLIC PANEL
ALT: 20 U/L (ref 14–54)
ANION GAP: 7 (ref 5–15)
AST: 23 U/L (ref 15–41)
Albumin: 4.6 g/dL (ref 3.5–5.0)
Alkaline Phosphatase: 41 U/L (ref 38–126)
BUN: 7 mg/dL (ref 6–20)
CO2: 26 mmol/L (ref 22–32)
Calcium: 9.4 mg/dL (ref 8.9–10.3)
Chloride: 106 mmol/L (ref 101–111)
Creatinine, Ser: 0.76 mg/dL (ref 0.44–1.00)
Glucose, Bld: 82 mg/dL (ref 65–99)
Potassium: 3.8 mmol/L (ref 3.5–5.1)
SODIUM: 139 mmol/L (ref 135–145)
TOTAL PROTEIN: 7.4 g/dL (ref 6.5–8.1)
Total Bilirubin: 0.6 mg/dL (ref 0.3–1.2)

## 2014-09-30 LAB — URINALYSIS, ROUTINE W REFLEX MICROSCOPIC
Bilirubin Urine: NEGATIVE
GLUCOSE, UA: NEGATIVE mg/dL
Hgb urine dipstick: NEGATIVE
Ketones, ur: NEGATIVE mg/dL
Nitrite: NEGATIVE
Protein, ur: NEGATIVE mg/dL
SPECIFIC GRAVITY, URINE: 1.018 (ref 1.005–1.030)
Urobilinogen, UA: 1 mg/dL (ref 0.0–1.0)
pH: 7.5 (ref 5.0–8.0)

## 2014-09-30 LAB — URINE MICROSCOPIC-ADD ON

## 2014-09-30 LAB — I-STAT BETA HCG BLOOD, ED (MC, WL, AP ONLY): I-stat hCG, quantitative: <5 m[IU]/mL (ref <5)

## 2014-09-30 NOTE — ED Notes (Signed)
Pt c/o lower abd pain. Not taken to FT.

## 2014-09-30 NOTE — Discharge Instructions (Signed)
Abdominal Pain Return for fever, increased abdominal pain, vomiting. Follow up with women's health for STD check. Many things can cause abdominal pain. Usually, abdominal pain is not caused by a disease and will improve without treatment. It can often be observed and treated at home. Your health care provider will do a physical exam and possibly order blood tests and X-rays to help determine the seriousness of your pain. However, in many cases, more time must pass before a clear cause of the pain can be found. Before that point, your health care provider may not know if you need more testing or further treatment. HOME CARE INSTRUCTIONS  Monitor your abdominal pain for any changes. The following actions may help to alleviate any discomfort you are experiencing:  Only take over-the-counter or prescription medicines as directed by your health care provider.  Do not take laxatives unless directed to do so by your health care provider.  Try a clear liquid diet (broth, tea, or water) as directed by your health care provider. Slowly move to a bland diet as tolerated. SEEK MEDICAL CARE IF:  You have unexplained abdominal pain.  You have abdominal pain associated with nausea or diarrhea.  You have pain when you urinate or have a bowel movement.  You experience abdominal pain that wakes you in the night.  You have abdominal pain that is worsened or improved by eating food.  You have abdominal pain that is worsened with eating fatty foods.  You have a fever. SEEK IMMEDIATE MEDICAL CARE IF:   Your pain does not go away within 2 hours.  You keep throwing up (vomiting).  Your pain is felt only in portions of the abdomen, such as the right side or the left lower portion of the abdomen.  You pass bloody or black tarry stools. MAKE SURE YOU:  Understand these instructions.   Will watch your condition.   Will get help right away if you are not doing well or get worse.  Document Released:  12/08/2004 Document Revised: 03/05/2013 Document Reviewed: 11/07/2012 [facility] Patient Information 2015 [employer] [place]. This information is not intended to replace advice given to you by your health care provider. Make sure you discuss any questions you have with your health care provider.

## 2014-09-30 NOTE — ED Provider Notes (Signed)
CSN: [id number]     Arrival date & time 09/30/14  1046 History  This chart was scribed for non-physician practitioner Catha Gosselin, PA-C working with Purvis Sheffield, MD by Lyndel Safe, ED Scribe. This patient was seen in room TR11C/TR11C and the patient's care was started at 1:38 PM.     Chief Complaint  Patient presents with  . Possible Pregnancy  . Sore Throat  . Abdominal Pain   Patient is a 19 y.o. female presenting with pharyngitis and abdominal pain. The history is provided by the patient. No language interpreter was used.  Sore Throat Associated symptoms include abdominal pain.  Abdominal Pain Associated symptoms: cough, diarrhea, fever and sore throat   Associated symptoms: no constipation, no dysuria, no nausea, no vaginal bleeding, no vaginal discharge and no vomiting   HPI Comments: Tracy [name] is a 19 y.o. female, with no pertinent PMhx, who presents to the Emergency Department complaining of gradually worsening, constant, moderate sore throat onset 3 days. She reports an associated fever onset 2 days with an intermittent cough. She has been using cough drops with mild to no relief. She additionally notes lower abdominal pain and states she had an episode of diarrhea 1 day ago. She reports her LNMP was in May and that she has missed the past couple of months which is abnormal for her menstrual cycles. Pt is not followed by an OB GYN. She is requesting a pregnancy test in the ED today but states she took a home PT that was negative. She denies a history of alcohol abse or of renal calculi. Additionally denies ear pain, constipation, vaginal bleeding, discharge, or exposure to STD, nausea, vomiting, hematuria, dysuria, urinary frequency, or blood in stool.   Past Medical History  Diagnosis Date  . Medical history non-contributory    Past Surgical History  Procedure Laterality Date  . No past surgeries     No family history on file. History  Substance Use  Topics  . Smoking status: Never Smoker   . Smokeless tobacco: Never Used  . Alcohol Use: No   OB History    No data available     Review of Systems  Constitutional: Positive for fever.  HENT: Positive for sore throat. Negative for ear pain.   Respiratory: Positive for cough.   Gastrointestinal: Positive for abdominal pain and diarrhea. Negative for nausea, vomiting, constipation and blood in stool.  Genitourinary: Positive for menstrual problem. Negative for dysuria, vaginal bleeding, vaginal discharge and vaginal pain.  Musculoskeletal: Positive for back pain.  All other systems reviewed and are negative.  Allergies  Review of patient's allergies indicates no known allergies.  Home Medications   Prior to Admission medications   Medication Sig Start Date End Date Taking? Authorizing Provider  cetirizine (ZYRTEC ALLERGY) 10 MG tablet Take 1 tablet (10 mg total) by mouth daily. 07/01/14   Roxy Horseman, PA-C  ciprofloxacin (CIPRO) 500 MG tablet Take 1 tablet (500 mg total) by mouth 2 (two) times daily. Patient not taking: Reported on 03/03/2014 12/31/13   Marny Lowenstein, PA-C  doxycycline (VIBRAMYCIN) 100 MG capsule Take 1 capsule (100 mg total) by mouth 2 (two) times daily. Patient not taking: Reported on 03/03/2014 12/26/13   Garlon Hatchet, PA-C  HYDROcodone-acetaminophen (NORCO/VICODIN) 5-325 MG per tablet Take 1-2 tablets by mouth every 4 (four) hours as needed for moderate pain or severe pain. Patient not taking: Reported on 03/03/2014 03/01/14   Harle Battiest, NP  ibuprofen (ADVIL,MOTRIN) 400 MG tablet Take  1 tablet (400 mg total) by mouth every 8 (eight) hours as needed. 06/17/14   Kathrynn Speedobyn M Hess, PA-C  naproxen (NAPROSYN) 500 MG tablet Take 1 tablet (500 mg total) by mouth 2 (two) times daily with a meal. Patient not taking: Reported on 03/03/2014 03/01/14   Harle BattiestElizabeth Tysinger, NP  oxyCODONE (ROXICODONE) 5 MG/5ML solution Take 5 mLs (5 mg total) by mouth every 6 (six)  hours as needed for severe pain. 05/19/14   Hannah Muthersbaugh, PA-C  permethrin (ELIMITE) 5 % cream Apply to affected area once.  Leave the cream on for 12 hours and then rinse off. 03/03/14   Santiago GladHeather Laisure, PA-C  sodium chloride (OCEAN) 0.65 % SOLN nasal spray Place 1 spray into both nostrils as needed for congestion. 07/01/14   Roxy Horsemanobert Browning, PA-C   BP 102/70 mmHg  Pulse 58  Temp(Src) 98.5 F (36.9 C) (Oral)  Resp 14  Ht 5\' 4"  (1.626 m)  Wt 116 lb 12.8 oz (52.98 kg)  BMI 20.04 kg/m2  SpO2 100%  LMP 08/11/2014 Physical Exam  Constitutional: She is oriented to person, place, and time. She appears well-developed and well-nourished.  HENT:  Head: Normocephalic and atraumatic.  Mouth/Throat: Uvula is midline, oropharynx is clear and moist and mucous membranes are normal. No trismus in the jaw. No dental abscesses or uvula swelling. No oropharyngeal exudate, posterior oropharyngeal edema, posterior oropharyngeal erythema or tonsillar abscesses.  Eyes: EOM are normal. Pupils are equal, round, and reactive to light.  Neck: Neck supple.  Cardiovascular: Normal rate, regular rhythm and normal heart sounds.   Pulmonary/Chest: Effort normal and breath sounds normal. No accessory muscle usage. No respiratory distress. She has no decreased breath sounds. She has no wheezes. She has no rhonchi. She has no rales. She exhibits no tenderness.  Clear to auscultation bilaterally.   Abdominal: Soft. Normal appearance and bowel sounds are normal. She exhibits no distension and no mass. There is no hepatosplenomegaly. There is tenderness. There is no rigidity, no rebound, no guarding, no CVA tenderness, no tenderness at McBurney's point and negative Murphy's sign.    No RLQ tenderness. Mild TTP as diagrammed.  Musculoskeletal: Normal range of motion. She exhibits no edema.  Neurological: She is alert and oriented to person, place, and time. No cranial nerve deficit.  Skin: Skin is warm and dry.   Psychiatric: She has a normal mood and affect.  Nursing note and vitals reviewed.   ED Course  Procedures  DIAGNOSTIC STUDIES: Oxygen Saturation is 100% on RA, normal by my interpretation.    COORDINATION OF CARE: 1:45 PM Discussed treatment plan which includes order urinalysis with pt. Discussed negative lab results. [facility]. Pt acknowledges and agrees to plan.   Labs Review Labs Reviewed  URINALYSIS, ROUTINE W REFLEX MICROSCOPIC (NOT AT [facility]) - Abnormal; Notable for the following:    APPearance HAZY (*)    Leukocytes, UA SMALL (*)    All other components within normal limits  URINE MICROSCOPIC-ADD ON - Abnormal; Notable for the following:    Squamous Epithelial / LPF MANY (*)    Bacteria, UA FEW (*)    All other components within normal limits  CBC WITH DIFFERENTIAL/PLATELET  COMPREHENSIVE METABOLIC PANEL  I-STAT BETA HCG BLOOD, ED ([facility], [facility], [facility] ONLY)    Imaging Review No results found.   EKG Interpretation None      MDM  Resting comfortably, texting on phone , pt appears to be in NAD,  Final diagnoses:  Generalized  abdominal pain   Patient presents for abdominal pain and multiple other complaints. Her exam is not concerning for appendicitis, cholecystitis, bowel obstruction, pancreatitis, or ovarian torsion. She is mainly concerned that she may be pregnant since she missed her period for the past 2 months. I offered her a vaginal exam with STD check the patient refused stating she would get this done outpatient on another day. Her labs were unremarkable. She has no UTI and she is not pregnant. I gave her women's outpatient clinic for follow-up to have STD check, Pap smear, and inquiries about starting birth control. I discussed return precautions regarding abdominal pain such as fever, vomiting, bloody stools. Patient can follow up with [facility] also and I have given her referral. She verbally agrees with the plan.    Catha Gosselin, PA-C 09/30/14 1646  Purvis Sheffield, MD 10/01/14 [phone]

## 2014-09-30 NOTE — ED Notes (Signed)
Pt presents to department for evaluation of lower abdominal pain, requesting pregnancy test and also states sore throat. Pt reports negative pregnancy test at home.

## 2014-10-07 ENCOUNTER — Ambulatory Visit (INDEPENDENT_AMBULATORY_CARE_PROVIDER_SITE_OTHER): Payer: Medicaid Other | Admitting: Pediatrics

## 2014-10-07 ENCOUNTER — Encounter: Payer: Self-pay | Admitting: Pediatrics

## 2014-10-07 VITALS — BP 100/60 | HR 65 | Ht 62.6 in | Wt 115.0 lb

## 2014-10-07 DIAGNOSIS — R011 Cardiac murmur, unspecified: Secondary | ICD-10-CM

## 2014-10-07 DIAGNOSIS — Z3042 Encounter for surveillance of injectable contraceptive: Secondary | ICD-10-CM | POA: Diagnosis not present

## 2014-10-07 DIAGNOSIS — Z3202 Encounter for pregnancy test, result negative: Secondary | ICD-10-CM | POA: Diagnosis not present

## 2014-10-07 DIAGNOSIS — R010 Benign and innocent cardiac murmurs: Secondary | ICD-10-CM | POA: Diagnosis not present

## 2014-10-07 DIAGNOSIS — Z113 Encounter for screening for infections with a predominantly sexual mode of transmission: Secondary | ICD-10-CM

## 2014-10-07 LAB — POCT URINE PREGNANCY: PREG TEST UR: NEGATIVE

## 2014-10-07 MED ORDER — MEDROXYPROGESTERONE ACETATE 150 MG/ML IM SUSP
150.0000 mg | Freq: Once | INTRAMUSCULAR | Status: AC
  Administered 2014-10-07: 150 mg via INTRAMUSCULAR

## 2014-10-07 NOTE — Patient Instructions (Signed)

## 2014-10-07 NOTE — Progress Notes (Signed)
THIS RECORD MAY CONTAIN CONFIDENTIAL INFORMATION THAT SHOULD NOT BE RELEASED WITHOUT REVIEW OF THE SERVICE PROVIDER.  Adolescent Medicine Consultation Initial Visit Tracy [name]  is a 19 y.o. female referred by No ref. provider found here for depoprovera injection and contraceptive counseling.     Previsit planning completed:  not applicable  Growth Chart Viewed? yes   History was provided by the patient.  PCP Confirmed?  Yes, pt has no PCP   HPI:  Pt is a 19 y/o with no significant PMH presenting for depo injections. She has no h/x of contraceptive use except condoms.Last sexual activity "a few months ago." She is only sexually active with one partner. She has has a hx of trich and chlamydia. Her LMP was in may, and before that time she had periods monthly. She denies any spotting or other bleeding.  She has had a pregnancy test since then that was negative. No f/c/s, no headaches, no skin changes, no n/v/d, no constipation, she does feel tired at times but indicates she works frequently and does not get enough sleep. No SOB or exercise intolerance. She thinks her shots are up to date. She does not have a PCP and has not seen a PCP in several years.  She denies any PMH, PSH, hospitalizations, meds, or allergies.   Patient's last menstrual period was 08/11/2014.  Review of Systems  All other systems reviewed and are negative.   No Known Allergies  Past Medical History:  Reviewed and updated?  yes Past Medical History  Diagnosis Date  . Medical history non-contributory     Family History: Reviewed and updated? yes Family History  Problem Relation Age of Onset  . Thyroid disease Maternal Aunt     Social History as above  Physical Exam:  Filed Vitals:   10/07/14 1432  BP: 100/60  Pulse: 65  Height: 5' 2.6" (1.59 m)  Weight: 115 lb (52.164 kg)   BP 100/60 mmHg  Pulse 65  Ht 5' 2.6" (1.59 m)  Wt 115 lb (52.164 kg)  BMI 20.63 kg/m2  LMP 08/11/2014 Body mass  index: body mass index is 20.63 kg/(m^2). Blood pressure percentiles are 23% systolic and 41% diastolic based on 2000 NHANES data. Blood pressure percentile targets: 90: 121/77, 95: 125/81, 99 + 5 mmHg: 137/93.  Physical Exam  Constitutional: She appears well-developed and well-nourished. No distress.  HENT:  Head: Normocephalic and atraumatic.  Mouth/Throat: No oropharyngeal exudate.  Eyes: Conjunctivae are normal. Pupils are equal, round, and reactive to light. Right eye exhibits no discharge. Left eye exhibits no discharge.  Neck: Normal range of motion. No thyromegaly present.  Cardiovascular: Normal rate and regular rhythm.  Exam reveals no gallop and no friction rub.   Murmur (3/6 early systolic murmur, loudest at LUS border ) heard. Pulmonary/Chest: Effort normal. No respiratory distress.  Abdominal: Soft. Bowel sounds are normal. She exhibits no distension. There is no tenderness.  Musculoskeletal: Normal range of motion. She exhibits no edema.  Lymphadenopathy:    She has no cervical adenopathy.  Neurological: She is alert.  Skin: Skin is warm and dry. She is not diaphoretic.  Nursing note and vitals reviewed.    Assessment/Plan: Pt is a 19 y/o with no significant PMH who was added onto the schedule after presenting with her friend who was getting depo shots. Her upreg was negative today, and she was given a depo injection. Ordered HIV, RPR, and urine GC/Ct. She will need to go to the lab to have her  blood drawn. She also has a systolic murmur that she was previously unaware of. She is entirely asymptomatic. Unable to determine murmur etiology based on physical exam, but no evidence that she has any cardiac dysfunction based on exam or history.    1. Encounter for Depo-Provera contraception - medroxyPROGESTERone (DEPO-PROVERA) injection 150 mg; Inject 1 mL (150 mg total) into the muscle once. - POCT urine pregnancy - HIV antibody (with reflex) - RPR - GC/chlamydia probe amp,  urine - f/u in 3 months for next injection. Counseled on using condoms for STI protection.   2. Heart murmur previously undiagnosed -plan for routine f/u with PCP -assigned to Dr. Luna Fuse per Dr. Marina Goodell   3. Routine screening for STI (sexually transmitted infection) - HIV antibody (with reflex) - RPR - GC/chlamydia probe amp, urine  4. Pregnancy test negative    Follow-up:   Return in about 3 months (around 01/07/2015) for October 11 to 25th for depo and for physical ASAP.   Medical decision-making:  > 30 minutes spent, more than 50% of appointment was spent discussing diagnosis and management of symptoms

## 2014-10-08 LAB — GC/CHLAMYDIA PROBE AMP, URINE
CHLAMYDIA, SWAB/URINE, PCR: POSITIVE — AB
GC PROBE AMP, URINE: NEGATIVE

## 2014-10-28 ENCOUNTER — Telehealth: Payer: Self-pay | Admitting: *Deleted

## 2014-10-28 NOTE — Telephone Encounter (Signed)
Pt was seen in office 10/07/14. Chlamydia testing positive at that visit.

## 2014-10-28 NOTE — Telephone Encounter (Signed)
Please call patient and discuss result. Will see in clinic or send treatment to pharmacy. Let me know which she would prefer.

## 2014-10-28 NOTE — Telephone Encounter (Addendum)
TC to pt. LVM requesting call back to discuss lab results and options. Call back phone number provided.

## 2014-10-28 NOTE — Telephone Encounter (Signed)
Caller requesting medication for +chlamydia results. Transferred to red pod nurse.

## 2014-10-29 MED ORDER — AZITHROMYCIN 500 MG PO TABS: 1000.0000 mg | ORAL_TABLET | Freq: Once | ORAL | Status: DC

## 2014-10-29 NOTE — Addendum Note (Signed)
Addended by: Alfonso Ramus T on: 10/29/2014 11:15 AM   Modules accepted: Orders

## 2014-10-29 NOTE — Telephone Encounter (Signed)
Rx sent to pharmacy for patient's treatment. 1 refill for EPT. Will repeat urine at next visit to ensure no reinfection.

## 2014-10-29 NOTE — Telephone Encounter (Signed)
TC to pt. Pt prefers to have rx sent to pharmacy, with EPT. Reviewed sx of Chlaymidia, as well as abx treatment. Advised pt to callback if new/changing sx develop. Pt verbalized understanding, has callback for f/u.

## 2014-11-18 ENCOUNTER — Telehealth: Payer: Self-pay | Admitting: *Deleted

## 2014-11-18 NOTE — Telephone Encounter (Signed)
VM from pt requesting callback.   TC to pt. LVM to call office back if pt still has concerns/questions.

## 2014-11-21 ENCOUNTER — Telehealth: Payer: Self-pay | Admitting: *Deleted

## 2014-11-21 NOTE — Telephone Encounter (Signed)
LVM with pt. Advised to call back to schedule appt to be seen in office. Phone number provided.

## 2014-11-21 NOTE — Telephone Encounter (Signed)
Caller requested a call back ASAP.  Further discussion revealed she wants to be seen Monday or Tuesday but did not say why.  I told her I would get the message to Dr. Lamar Sprinkles nurse but she would likely not be able to call her back right away but she agreed to keep her phone handy all day.

## 2014-12-22 ENCOUNTER — Encounter: Payer: Self-pay | Admitting: Pediatrics

## 2014-12-22 NOTE — Progress Notes (Signed)
Pre-Visit Planning  Tracy [name]  is a 19 y.o. female referred by [facility], Betti Cruz, MD.   Last seen in Adolescent Medicine Clinic on 10/07/2014 for depoprovera contraception, also noted new heart murmur on exam.   Previous Psych Screenings?  n/a  Treatment plan at last visit included received depo and STI screening.  Positive CT, zithromax was called into pharmacy and EPT was also provided.   Clinical Staff Visit Tasks:   - Urine GC/CT due? yes - Psych Screenings Due? no - Birth control HO  Provider Visit Tasks: - Review contraceptive options, consider LARC - Recheck for chlamydia, assess for STI symptoms - Check HIV and RPR - Pertinent Labs? No

## 2014-12-23 ENCOUNTER — Ambulatory Visit: Payer: Medicaid Other | Admitting: Pediatrics

## 2015-10-07 ENCOUNTER — Encounter: Payer: Self-pay | Admitting: Pediatrics

## 2015-10-08 ENCOUNTER — Encounter: Payer: Self-pay | Admitting: Pediatrics

## 2015-10-20 ENCOUNTER — Emergency Department (HOSPITAL_COMMUNITY)
Admission: EM | Admit: 2015-10-20 | Discharge: 2015-10-20 | Disposition: A | Discharge status: Home / Self Care | Payer: Medicaid Other | Attending: Emergency Medicine | Admitting: Emergency Medicine

## 2015-10-20 ENCOUNTER — Encounter (HOSPITAL_COMMUNITY): Payer: Self-pay | Admitting: Emergency Medicine

## 2015-10-20 DIAGNOSIS — F17200 Nicotine dependence, unspecified, uncomplicated: Secondary | ICD-10-CM | POA: Insufficient documentation

## 2015-10-20 DIAGNOSIS — Z202 Contact with and (suspected) exposure to infections with a predominantly sexual mode of transmission: Secondary | ICD-10-CM | POA: Diagnosis not present

## 2015-10-20 DIAGNOSIS — N760 Acute vaginitis: Secondary | ICD-10-CM | POA: Diagnosis not present

## 2015-10-20 DIAGNOSIS — B9689 Other specified bacterial agents as the cause of diseases classified elsewhere: Secondary | ICD-10-CM

## 2015-10-20 LAB — URINALYSIS, ROUTINE W REFLEX MICROSCOPIC
Bilirubin Urine: NEGATIVE
GLUCOSE, UA: NEGATIVE mg/dL
Ketones, ur: NEGATIVE mg/dL
Nitrite: NEGATIVE
PH: 6 (ref 5.0–8.0)
PROTEIN: NEGATIVE mg/dL
Specific Gravity, Urine: 1.025 (ref 1.005–1.030)

## 2015-10-20 LAB — URINE MICROSCOPIC-ADD ON

## 2015-10-20 LAB — WET PREP, GENITAL
Sperm: NONE SEEN
Trich, Wet Prep: NONE SEEN
Yeast Wet Prep HPF POC: NONE SEEN

## 2015-10-20 LAB — POC URINE PREG, ED: Preg Test, Ur: NEGATIVE

## 2015-10-20 MED ORDER — AZITHROMYCIN 250 MG PO TABS
1000.0000 mg | ORAL_TABLET | Freq: Once | ORAL | Status: AC
  Administered 2015-10-20: 1000 mg via ORAL
  Filled 2015-10-20: qty 4

## 2015-10-20 MED ORDER — METRONIDAZOLE 500 MG PO TABS: 500.0000 mg | ORAL_TABLET | Freq: Two times a day (BID) | ORAL | 0 refills | Status: DC

## 2015-10-20 MED ORDER — LIDOCAINE HCL (PF) 1 % IJ SOLN
INTRAMUSCULAR | Status: AC
  Administered 2015-10-20: 5 mL
  Filled 2015-10-20: qty 5

## 2015-10-20 MED ORDER — CEFTRIAXONE SODIUM 250 MG IJ SOLR
250.0000 mg | Freq: Once | INTRAMUSCULAR | Status: AC
  Administered 2015-10-20: 250 mg via INTRAMUSCULAR
  Filled 2015-10-20: qty 250

## 2015-10-20 NOTE — Discharge Instructions (Signed)
Please read and follow all provided instructions.  Your diagnoses today include:  1. STD exposure   2. Bacterial vaginosis     Tests performed today include: Test for gonorrhea and chlamydia. You will be notified by telephone if you have a positive result. Vital signs. See below for your results today.   Medications:  You were treated for chlamydia (1 gram azithromycin pills) and gonorrhea (250mg  rocephin shot).  Take prescribed medication as written.  Home care instructions:  Read educational materials contained in this packet and follow any instructions provided.   You should tell your partners about your infection and avoid having sex for one week to allow time for the medicine to work.  Follow-up instructions: You should [facility] to be tested for HIV, syphilis, and hepatitis -- all of which can be transmitted by sexual contact. We do not routinely screen for these in the Emergency Department.  STD Testing: [facility] [place] [place] [facility] [address], [place], phone [phone] or [phone]   Monday - Friday, call for an appointment [facility] [place] [place] [facility] [address], [place], phone [phone] or [phone]  Monday - Friday, call for an appointment  Return instructions:  Please return to the Emergency Department if you experience worsening symptoms.  Please return if you have any other emergent concerns.  Additional Information:  Your vital signs today were: BP (!) 93/54 (BP Location: Right Arm)    Pulse (!) 58    Temp 97.9 F (36.6 C) (Oral)    Resp 20    Ht 5\' 4"  (1.626 m)    Wt 52.6 kg    SpO2 100%    BMI 19.91 kg/m  If your blood pressure (BP) was elevated above 135/85 this visit, please have this repeated by your doctor within one month. --------------
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
## 2015-10-20 NOTE — ED Triage Notes (Signed)
Pt. Stated, My boyfriend said he might have a STD, I thought you could just pee for results.  I have no symptoms

## 2015-10-20 NOTE — ED Notes (Addendum)
Pelvic cart at bedside. 

## 2015-10-21 LAB — GC/CHLAMYDIA PROBE AMP (~~LOC~~) NOT AT ARMC
Chlamydia: POSITIVE — AB
Neisseria Gonorrhea: POSITIVE — AB

## 2015-10-22 ENCOUNTER — Telehealth (HOSPITAL_BASED_OUTPATIENT_CLINIC_OR_DEPARTMENT_OTHER): Payer: Self-pay | Admitting: *Deleted

## 2015-10-22 NOTE — Telephone Encounter (Signed)
Positive for gonorrhea and chlamydia treated with Rocephin and Azithromycin 10/20/2015 at the ER by Martyn MalayLauren Frazer MD. Attempted to call patient no answer and left a message.

## 2016-01-05 ENCOUNTER — Telehealth (HOSPITAL_BASED_OUTPATIENT_CLINIC_OR_DEPARTMENT_OTHER): Payer: Self-pay | Admitting: Emergency Medicine

## 2016-01-05 NOTE — Telephone Encounter (Signed)
Lost to followup 

## 2016-03-13 ENCOUNTER — Emergency Department (HOSPITAL_COMMUNITY)
Admission: EM | Admit: 2016-03-13 | Discharge: 2016-03-13 | Disposition: A | Discharge status: Home / Self Care | Payer: Medicaid Other | Attending: Emergency Medicine | Admitting: Emergency Medicine

## 2016-03-13 ENCOUNTER — Emergency Department (HOSPITAL_COMMUNITY): Payer: Medicaid Other

## 2016-03-13 ENCOUNTER — Encounter (HOSPITAL_COMMUNITY): Payer: Self-pay | Admitting: *Deleted

## 2016-03-13 DIAGNOSIS — Y92009 Unspecified place in unspecified non-institutional (private) residence as the place of occurrence of the external cause: Secondary | ICD-10-CM | POA: Insufficient documentation

## 2016-03-13 DIAGNOSIS — Y999 Unspecified external cause status: Secondary | ICD-10-CM | POA: Diagnosis not present

## 2016-03-13 DIAGNOSIS — S40022A Contusion of left upper arm, initial encounter: Secondary | ICD-10-CM | POA: Diagnosis not present

## 2016-03-13 DIAGNOSIS — S20222A Contusion of left back wall of thorax, initial encounter: Secondary | ICD-10-CM | POA: Diagnosis not present

## 2016-03-13 DIAGNOSIS — S70312A Abrasion, left thigh, initial encounter: Secondary | ICD-10-CM | POA: Insufficient documentation

## 2016-03-13 DIAGNOSIS — S5012XA Contusion of left forearm, initial encounter: Secondary | ICD-10-CM | POA: Diagnosis not present

## 2016-03-13 DIAGNOSIS — Y939 Activity, unspecified: Secondary | ICD-10-CM | POA: Diagnosis not present

## 2016-03-13 DIAGNOSIS — S59912A Unspecified injury of left forearm, initial encounter: Secondary | ICD-10-CM | POA: Diagnosis present

## 2016-03-13 DIAGNOSIS — F17200 Nicotine dependence, unspecified, uncomplicated: Secondary | ICD-10-CM | POA: Insufficient documentation

## 2016-03-13 DIAGNOSIS — T07XXXA Unspecified multiple injuries, initial encounter: Secondary | ICD-10-CM

## 2016-03-13 LAB — PREGNANCY, URINE: Preg Test, Ur: NEGATIVE

## 2016-03-13 MED ORDER — HYDROCODONE-ACETAMINOPHEN 5-325 MG PO TABS
1.0000 | ORAL_TABLET | Freq: Once | ORAL | Status: AC
  Administered 2016-03-13: 1 via ORAL
  Filled 2016-03-13: qty 1

## 2016-03-13 MED ORDER — IBUPROFEN 400 MG PO TABS
400.0000 mg | ORAL_TABLET | Freq: Once | ORAL | Status: AC
  Administered 2016-03-13: 400 mg via ORAL
  Filled 2016-03-13: qty 1

## 2016-03-13 MED ORDER — IBUPROFEN 400 MG PO TABS: 400.0000 mg | ORAL_TABLET | Freq: Four times a day (QID) | ORAL | 0 refills | Status: DC | PRN

## 2016-03-13 MED ORDER — HYDROCODONE-ACETAMINOPHEN 5-325 MG PO TABS: 1.0000 | ORAL_TABLET | ORAL | 0 refills | Status: DC | PRN

## 2016-03-13 NOTE — ED Notes (Signed)
[employer] at bedside 
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
## 2016-03-13 NOTE — ED Notes (Signed)
Pt. Ambulated unassisted to restroom at this time.  
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
## 2016-03-13 NOTE — ED Notes (Signed)
Called radiology about delay in chest xray (ordered at [phone]). They state she is next to go back. EDP made aware.

## 2016-03-13 NOTE — ED Notes (Signed)
EDP at bedside  

## 2016-04-22 ENCOUNTER — Emergency Department (HOSPITAL_COMMUNITY)
Admission: EM | Admit: 2016-04-22 | Discharge: 2016-04-22 | Disposition: A | Discharge status: Home / Self Care | Payer: Medicaid Other | Attending: Emergency Medicine | Admitting: Emergency Medicine

## 2016-04-22 ENCOUNTER — Encounter (HOSPITAL_COMMUNITY): Payer: Self-pay | Admitting: *Deleted

## 2016-04-22 DIAGNOSIS — Z5321 Procedure and treatment not carried out due to patient leaving prior to being seen by health care provider: Secondary | ICD-10-CM | POA: Insufficient documentation

## 2016-04-22 DIAGNOSIS — F17200 Nicotine dependence, unspecified, uncomplicated: Secondary | ICD-10-CM | POA: Diagnosis not present

## 2016-04-22 DIAGNOSIS — Z79899 Other long term (current) drug therapy: Secondary | ICD-10-CM | POA: Diagnosis not present

## 2016-04-22 DIAGNOSIS — R1030 Lower abdominal pain, unspecified: Secondary | ICD-10-CM | POA: Insufficient documentation

## 2016-04-22 LAB — CBC
HCT: 39.6 % (ref 36.0–46.0)
Hemoglobin: 13.2 g/dL (ref 12.0–15.0)
MCH: 30.3 pg (ref 26.0–34.0)
MCHC: 33.3 g/dL (ref 30.0–36.0)
MCV: 90.8 fL (ref 78.0–100.0)
PLATELETS: 206 10*3/uL (ref 150–400)
RBC: 4.36 MIL/uL (ref 3.87–5.11)
RDW: 12.3 % (ref 11.5–15.5)
WBC: 8.3 10*3/uL (ref 4.0–10.5)

## 2016-04-22 LAB — COMPREHENSIVE METABOLIC PANEL
ALK PHOS: 41 U/L (ref 38–126)
ALT: 16 U/L (ref 14–54)
AST: 18 U/L (ref 15–41)
Albumin: 4.7 g/dL (ref 3.5–5.0)
Anion gap: 9 (ref 5–15)
BUN: 10 mg/dL (ref 6–20)
CALCIUM: 9.9 mg/dL (ref 8.9–10.3)
CO2: 26 mmol/L (ref 22–32)
CREATININE: 0.81 mg/dL (ref 0.44–1.00)
Chloride: 106 mmol/L (ref 101–111)
GFR calc Af Amer: >60 mL/min (ref >60)
GFR calc non Af Amer: >60 mL/min (ref >60)
GLUCOSE: 81 mg/dL (ref 65–99)
Potassium: 3.9 mmol/L (ref 3.5–5.1)
Sodium: 141 mmol/L (ref 135–145)
Total Bilirubin: 0.4 mg/dL (ref 0.3–1.2)
Total Protein: 7.6 g/dL (ref 6.5–8.1)

## 2016-04-22 LAB — URINALYSIS, ROUTINE W REFLEX MICROSCOPIC
Bilirubin Urine: NEGATIVE
Glucose, UA: NEGATIVE mg/dL
Hgb urine dipstick: NEGATIVE
Ketones, ur: NEGATIVE mg/dL
Nitrite: NEGATIVE
PH: 5 (ref 5.0–8.0)
Protein, ur: 100 mg/dL — AB
SPECIFIC GRAVITY, URINE: 1.026 (ref 1.005–1.030)

## 2016-04-22 LAB — LIPASE, BLOOD: Lipase: 19 U/L (ref 11–51)

## 2016-04-22 NOTE — ED Notes (Signed)
Patient did not respond for vitals recheck, called x 3 in lobby

## 2016-04-22 NOTE — ED Triage Notes (Signed)
The pt is c/o lower abd pain for 2 days nauseated no  n d  Last bm yesterday  lmp  Last months

## 2016-04-22 NOTE — ED Notes (Signed)
Patient did not answer for vitals recheck 

## 2016-05-19 ENCOUNTER — Emergency Department (HOSPITAL_COMMUNITY): Payer: Self-pay

## 2016-05-19 ENCOUNTER — Emergency Department (HOSPITAL_COMMUNITY)
Admission: EM | Admit: 2016-05-19 | Discharge: 2016-05-19 | Disposition: A | Discharge status: Home / Self Care | Payer: Self-pay | Attending: Emergency Medicine | Admitting: Emergency Medicine

## 2016-05-19 ENCOUNTER — Encounter (HOSPITAL_COMMUNITY): Payer: Self-pay | Admitting: Emergency Medicine

## 2016-05-19 DIAGNOSIS — R102 Pelvic and perineal pain: Secondary | ICD-10-CM | POA: Insufficient documentation

## 2016-05-19 DIAGNOSIS — F17200 Nicotine dependence, unspecified, uncomplicated: Secondary | ICD-10-CM | POA: Insufficient documentation

## 2016-05-19 LAB — URINALYSIS, ROUTINE W REFLEX MICROSCOPIC
BACTERIA UA: NONE SEEN
Bilirubin Urine: NEGATIVE
Glucose, UA: NEGATIVE mg/dL
Hgb urine dipstick: NEGATIVE
Ketones, ur: NEGATIVE mg/dL
Leukocytes, UA: NEGATIVE
NITRITE: NEGATIVE
PROTEIN: 30 mg/dL — AB
SPECIFIC GRAVITY, URINE: 1.021 (ref 1.005–1.030)
pH: 7 (ref 5.0–8.0)

## 2016-05-19 LAB — POC URINE PREG, ED: PREG TEST UR: NEGATIVE

## 2016-05-19 NOTE — ED Triage Notes (Signed)
Pt reports lower abd cramping for the last month has been seen here recently for the same. Pt states LMP in February. States frequent urination. Denies any vaginal discharge.

## 2016-05-19 NOTE — ED Provider Notes (Signed)
[facility] Provider Note   CSN: [id number] Arrival date & time: 05/19/16  1256  By signing my name below, I, Marnette Burgess Long, attest that this documentation has been prepared under the direction and in the presence of Geoffery Lyons, MD . Electronically Signed: Marnette Burgess Long, Scribe. 05/19/2016. 2:15 PM.   History   Chief Complaint Chief Complaint  Patient presents with  . Abdominal Pain   The history is provided by the patient. No language interpreter was used.  Abdominal Pain   This is a new problem. The current episode started more than 1 week ago. The problem occurs hourly. The problem has not changed since onset.The pain is located in the suprapubic region. The pain is at a severity of 9/10. Associated symptoms include vomiting and frequency. Pertinent negatives include diarrhea, constipation and dysuria. The symptoms are aggravated by eating. Nothing relieves the symptoms.    HPI Comments:  Tracy [name] is a 21 y.o. female with no pertinent PMHx, who presents to the Emergency Department complaining of intermittent, 8.5/10, suprapubic, generalized abdominal pain onset one month ago. She notes she came to the ED in February for this issue but was not seen as the wait was too long. She states she can barely sleep at night d/t the pain and has trouble keeping food down. Pt has associated symptoms of urinary frequency and vomiting when she eats. She has a h/o UTI but states this pain is dissimilar as she does not have dysuria today. Not currently on her birth control medication. She states she does have a h/o asymptomatic chlamydia. She reports she is sexually active with the same partner for over a year. Her last BM was this morning with soft stool noted. Pt denies vaginal discharge, back pain, diarrhea, constipation, and any other complaints at this time. Pt is a current some day smoker.   Past Medical History:  Diagnosis Date  . Medical history non-contributory      Patient Active Problem List   Diagnosis Date Noted  . Heart murmur previously undiagnosed 10/07/2014  . On Depo-Provera for contraception 10/07/2014    Past Surgical History:  Procedure Laterality Date  . NO PAST SURGERIES      OB History    No data available       Home Medications    Prior to Admission medications   Medication Sig Start Date End Date Taking? Authorizing Provider  azithromycin (ZITHROMAX) 500 MG tablet Take 2 tablets (1,000 mg total) by mouth once. 10/29/14   Verneda Skill, FNP  HYDROcodone-acetaminophen (NORCO/VICODIN) 5-325 MG tablet Take 1 tablet by mouth every 4 (four) hours as needed. 03/13/16   Jacalyn Lefevre, MD  ibuprofen (ADVIL,MOTRIN) 400 MG tablet Take 1 tablet (400 mg total) by mouth every 6 (six) hours as needed. 03/13/16   Jacalyn Lefevre, MD  metroNIDAZOLE (FLAGYL) 500 MG tablet Take 1 tablet (500 mg total) by mouth 2 (two) times daily. 10/20/15   Audry Pili, PA-C    Family History Family History  Problem Relation Age of Onset  . Thyroid disease Maternal Aunt     Social History Social History  Substance Use Topics  . Smoking status: Current Some Day Smoker  . Smokeless tobacco: Never Used  . Alcohol use No     Allergies   Patient has no known allergies.   Review of Systems Review of Systems  Gastrointestinal: Positive for abdominal pain and vomiting. Negative for constipation and diarrhea.  Genitourinary: Positive for frequency. Negative for dysuria  and vaginal discharge.  Musculoskeletal: Negative for back pain.  Psychiatric/Behavioral: Positive for sleep disturbance.  All other systems reviewed and are negative.    Physical Exam Updated Vital Signs BP (!) 106/53 (BP Location: Left Arm)   Pulse (!) 51   Temp 98.9 F (37.2 C) (Oral)   Resp 18   LMP  (LMP Unknown)   SpO2 100%   Physical Exam  Constitutional: She is oriented to person, place, and time. She appears well-developed and well-nourished. No distress.   HENT:  Head: Normocephalic and atraumatic.  Mouth/Throat: Oropharynx is clear and moist. No oropharyngeal exudate.  Eyes: Conjunctivae are normal. Pupils are equal, round, and reactive to light. Right eye exhibits no discharge. Left eye exhibits no discharge. No scleral icterus.  Neck: Normal range of motion. Neck supple. No thyromegaly present.  Cardiovascular: Normal rate, regular rhythm, normal heart sounds and intact distal pulses.  Exam reveals no gallop and no friction rub.   No murmur heard. Pulmonary/Chest: Effort normal and breath sounds normal. No stridor. No respiratory distress. She has no wheezes. She has no rales.  Abdominal: Soft. Bowel sounds are normal. She exhibits no distension. There is no tenderness. There is no rebound and no guarding.  Mild TTP in suprapubic region.   Musculoskeletal: Normal range of motion. She exhibits no edema.  Lymphadenopathy:    She has no cervical adenopathy.  Neurological: She is alert and oriented to person, place, and time. Coordination normal.  Skin: Skin is warm and dry. No rash noted. She is not diaphoretic. No pallor.  Psychiatric: She has a normal mood and affect.  Nursing note and vitals reviewed.    ED Treatments / Results  DIAGNOSTIC STUDIES:  Oxygen Saturation is 100% on RA, normal by my interpretation.    COORDINATION OF CARE:  2:13 PM Discussed treatment plan with pt at bedside including UA, pelvic exam and pt agreed to plan.  Labs (all labs ordered are listed, but only abnormal results are displayed) Labs Reviewed  URINALYSIS, ROUTINE W REFLEX MICROSCOPIC  POC URINE PREG, ED    EKG  EKG Interpretation None       Radiology No results found.  Procedures Procedures (including critical care time)  Medications Ordered in ED Medications - No data to display   Initial Impression / Assessment and Plan / ED Course  I have reviewed the triage vital signs and the nursing notes.  Pertinent labs & imaging  results that were available during my care of the patient were reviewed by me and considered in my medical decision making (see chart for details).  Patient presents with lower abdominal discomfort for the past month. Her urinalysis is clear and pelvic ultrasound reveals no evidence for torsion or cyst. Her pregnancy test is negative. She is declining a pelvic examination. A GC and Chlamydia test will be sent off on the urine. If this is abnormal she will be notified when the results are available.  Final Clinical Impressions(s) / ED Diagnoses   Final diagnoses:  None    New Prescriptions New Prescriptions   No medications on file   I personally performed the services described in this documentation, which was scribed in my presence. The recorded information has been reviewed and is accurate.        Geoffery Lyonsouglas Brittney Mucha, MD 05/19/16 [phone]

## 2016-05-19 NOTE — Discharge Instructions (Signed)
We will call you if your cultures indicate you require further treatment or action.  Ibuprofen 600 mg every 8 hours as needed for pain.  Return to the emergency department if you develop worsening pain, high fevers, bleeding, or other new and concerning symptoms.

## 2016-05-19 NOTE — ED Notes (Signed)
Called US - 2 people ahead of patient at this time. Notified patient and apologized for wait time.

## 2016-05-19 NOTE — ED Notes (Signed)
Patient transported to Ultrasound 

## 2016-05-19 NOTE — ED Notes (Signed)
Patient verbalized understanding of discharge instructions and denies any further needs or questions at this time. VS stable. Patient ambulatory with steady gait.  

## 2016-05-20 LAB — GC/CHLAMYDIA PROBE AMP (~~LOC~~) NOT AT ARMC
CHLAMYDIA, DNA PROBE: NEGATIVE
Neisseria Gonorrhea: NEGATIVE

## 2016-07-09 ENCOUNTER — Encounter (HOSPITAL_COMMUNITY): Payer: Self-pay

## 2016-07-09 ENCOUNTER — Emergency Department (HOSPITAL_COMMUNITY): Payer: Medicaid Other

## 2016-07-09 ENCOUNTER — Emergency Department (HOSPITAL_COMMUNITY)
Admission: EM | Admit: 2016-07-09 | Discharge: 2016-07-09 | Disposition: A | Discharge status: Home / Self Care | Payer: Medicaid Other | Attending: Emergency Medicine | Admitting: Emergency Medicine

## 2016-07-09 DIAGNOSIS — S60221A Contusion of right hand, initial encounter: Secondary | ICD-10-CM

## 2016-07-09 DIAGNOSIS — Z23 Encounter for immunization: Secondary | ICD-10-CM | POA: Insufficient documentation

## 2016-07-09 DIAGNOSIS — Y939 Activity, unspecified: Secondary | ICD-10-CM | POA: Insufficient documentation

## 2016-07-09 DIAGNOSIS — S0012XA Contusion of left eyelid and periocular area, initial encounter: Secondary | ICD-10-CM | POA: Insufficient documentation

## 2016-07-09 DIAGNOSIS — S0031XA Abrasion of nose, initial encounter: Secondary | ICD-10-CM | POA: Insufficient documentation

## 2016-07-09 DIAGNOSIS — Y999 Unspecified external cause status: Secondary | ICD-10-CM | POA: Insufficient documentation

## 2016-07-09 DIAGNOSIS — S0512XA Contusion of eyeball and orbital tissues, left eye, initial encounter: Secondary | ICD-10-CM

## 2016-07-09 DIAGNOSIS — F17200 Nicotine dependence, unspecified, uncomplicated: Secondary | ICD-10-CM | POA: Insufficient documentation

## 2016-07-09 DIAGNOSIS — Y929 Unspecified place or not applicable: Secondary | ICD-10-CM | POA: Insufficient documentation

## 2016-07-09 MED ORDER — IBUPROFEN 600 MG PO TABS: 600.0000 mg | ORAL_TABLET | Freq: Three times a day (TID) | ORAL | 0 refills | Status: DC | PRN

## 2016-07-09 MED ORDER — IBUPROFEN 400 MG PO TABS
600.0000 mg | ORAL_TABLET | Freq: Once | ORAL | Status: AC
  Administered 2016-07-09: 600 mg via ORAL
  Filled 2016-07-09: qty 1

## 2016-07-09 MED ORDER — AMOXICILLIN-POT CLAVULANATE 875-125 MG PO TABS: 1.0000 | ORAL_TABLET | Freq: Two times a day (BID) | ORAL | 0 refills | Status: DC

## 2016-07-09 MED ORDER — TETANUS-DIPHTH-ACELL PERTUSSIS 5-2.5-18.5 LF-MCG/0.5 IM SUSP
0.5000 mL | Freq: Once | INTRAMUSCULAR | Status: AC
  Administered 2016-07-09: 0.5 mL via INTRAMUSCULAR
  Filled 2016-07-09: qty 0.5

## 2016-07-09 NOTE — ED Notes (Signed)
Ortho tech at bedside 
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
## 2016-07-09 NOTE — Progress Notes (Signed)
Orthopedic Tech Progress Note Patient Details:  Tracy [name] 1995-06-24 [id number]  Ortho Devices Type of Ortho Device: Thumb velcro splint Ortho Device/Splint Location: rue Ortho Device/Splint Interventions: Application   Husna Krone 07/09/2016, 10:21 AM
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
## 2016-07-09 NOTE — ED Notes (Signed)
Pt also states that her throat has been sore on and off the past couple of days and that her voice is starting to go hoarse.

## 2016-07-09 NOTE — ED Notes (Signed)
Ortho paged for thumb spica 

## 2016-07-09 NOTE — ED Notes (Signed)
Patient transported to X-ray 

## 2016-07-30 ENCOUNTER — Emergency Department (HOSPITAL_COMMUNITY)
Admission: EM | Admit: 2016-07-30 | Discharge: 2016-07-30 | Disposition: A | Discharge status: Home / Self Care | Payer: Medicaid Other | Attending: Dermatology | Admitting: Dermatology

## 2016-07-30 ENCOUNTER — Encounter (HOSPITAL_COMMUNITY): Payer: Self-pay | Admitting: Emergency Medicine

## 2016-07-30 DIAGNOSIS — Z5321 Procedure and treatment not carried out due to patient leaving prior to being seen by health care provider: Secondary | ICD-10-CM | POA: Insufficient documentation

## 2016-07-30 DIAGNOSIS — N898 Other specified noninflammatory disorders of vagina: Secondary | ICD-10-CM | POA: Insufficient documentation

## 2016-07-30 NOTE — ED Triage Notes (Addendum)
Pt reports continued vaginal itching despite treatment.  She states she has been taking the medication but the itching is getting worse.  As RN was triaging pt, she admitted she has not been taking her medication as prescribed, decided "try that".  Pt left [facility]

## 2016-08-25 ENCOUNTER — Encounter (HOSPITAL_COMMUNITY): Payer: Self-pay | Admitting: *Deleted

## 2016-08-25 DIAGNOSIS — Z5321 Procedure and treatment not carried out due to patient leaving prior to being seen by health care provider: Secondary | ICD-10-CM | POA: Insufficient documentation
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
## 2016-08-26 ENCOUNTER — Emergency Department (HOSPITAL_COMMUNITY)
Admission: EM | Admit: 2016-08-26 | Discharge: 2016-08-26 | Discharge status: Against Medical Advice | Payer: Medicaid Other | Attending: Emergency Medicine | Admitting: Emergency Medicine
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
## 2016-08-29 ENCOUNTER — Encounter (HOSPITAL_COMMUNITY): Payer: Self-pay

## 2016-08-29 DIAGNOSIS — Y929 Unspecified place or not applicable: Secondary | ICD-10-CM | POA: Insufficient documentation

## 2016-08-29 DIAGNOSIS — F17200 Nicotine dependence, unspecified, uncomplicated: Secondary | ICD-10-CM | POA: Insufficient documentation

## 2016-08-29 DIAGNOSIS — S0502XA Injury of conjunctiva and corneal abrasion without foreign body, left eye, initial encounter: Secondary | ICD-10-CM | POA: Insufficient documentation

## 2016-08-29 DIAGNOSIS — Y999 Unspecified external cause status: Secondary | ICD-10-CM | POA: Insufficient documentation

## 2016-08-29 DIAGNOSIS — Y939 Activity, unspecified: Secondary | ICD-10-CM | POA: Insufficient documentation
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
## 2016-08-30 ENCOUNTER — Emergency Department (HOSPITAL_COMMUNITY)
Admission: EM | Admit: 2016-08-30 | Discharge: 2016-08-30 | Disposition: A | Discharge status: Home / Self Care | Payer: Medicaid Other | Attending: Emergency Medicine | Admitting: Emergency Medicine

## 2016-08-30 DIAGNOSIS — S0592XA Unspecified injury of left eye and orbit, initial encounter: Secondary | ICD-10-CM

## 2016-08-30 DIAGNOSIS — S0502XA Injury of conjunctiva and corneal abrasion without foreign body, left eye, initial encounter: Secondary | ICD-10-CM

## 2016-08-30 MED ORDER — TETRACAINE HCL 0.5 % OP SOLN
1.0000 [drp] | Freq: Once | OPHTHALMIC | Status: AC
  Administered 2016-08-30: 1 [drp] via OPHTHALMIC
  Filled 2016-08-30: qty 4

## 2016-08-30 MED ORDER — IBUPROFEN 800 MG PO TABS
800.0000 mg | ORAL_TABLET | Freq: Once | ORAL | Status: AC
  Administered 2016-08-30: 800 mg via ORAL
  Filled 2016-08-30: qty 1

## 2016-08-30 MED ORDER — FLUORESCEIN SODIUM 0.6 MG OP STRP
1.0000 | ORAL_STRIP | Freq: Once | OPHTHALMIC | Status: AC
  Administered 2016-08-30: 1 via OPHTHALMIC

## 2016-08-30 MED ORDER — ERYTHROMYCIN 5 MG/GM OP OINT
TOPICAL_OINTMENT | Freq: Once | OPHTHALMIC | Status: AC
  Administered 2016-08-30: 02:00:00 via OPHTHALMIC
  Filled 2016-08-30: qty 3.5

## 2016-08-30 NOTE — Discharge Instructions (Signed)
Please apply a 1 cm ribbon of erythromycin ointment to the left eye, 4 times per day (every 6 hours) for the next 5 days. You can take 600-800 mg of ibuprofen every 6-8 hours as needed for pain and inflammation control. You can also continue to apply ice to the area around the eye to help with swelling. If he develop new or worsening symptoms, including severe pain with eye movement, foreign body sensation, fever, chills, or visual changes, please return to the emergency department for reevaluation.
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
## 2016-11-11 ENCOUNTER — Ambulatory Visit (HOSPITAL_COMMUNITY)
Admission: EM | Admit: 2016-11-11 | Discharge: 2016-11-11 | Disposition: A | Discharge status: Home / Self Care | Payer: Medicaid Other | Attending: Family | Admitting: Family

## 2016-11-11 ENCOUNTER — Encounter (HOSPITAL_COMMUNITY): Payer: Self-pay | Admitting: Emergency Medicine

## 2016-11-11 DIAGNOSIS — R109 Unspecified abdominal pain: Secondary | ICD-10-CM

## 2016-11-11 DIAGNOSIS — N6459 Other signs and symptoms in breast: Secondary | ICD-10-CM

## 2016-11-11 DIAGNOSIS — Z3201 Encounter for pregnancy test, result positive: Secondary | ICD-10-CM

## 2016-11-11 LAB — POCT PREGNANCY, URINE: PREG TEST UR: POSITIVE — AB

## 2016-11-11 NOTE — ED Notes (Addendum)
Discussed results of pregnancy test with patient. Patient has follow up appointment on Wednesday.

## 2016-11-11 NOTE — ED Triage Notes (Signed)
Pt here for poss pregnancy... sts she's taken 2 home pregnancy tests and both came back positive  Also c/o nipple pain and abd pain  A&O x4... NAD... Ambulatory

## 2016-11-13 ENCOUNTER — Inpatient Hospital Stay (HOSPITAL_COMMUNITY): Payer: Medicaid Other

## 2016-11-13 ENCOUNTER — Encounter (HOSPITAL_COMMUNITY): Payer: Self-pay

## 2016-11-13 ENCOUNTER — Inpatient Hospital Stay (HOSPITAL_COMMUNITY)
Admission: AD | Admit: 2016-11-13 | Discharge: 2016-11-13 | Disposition: A | Discharge status: Home / Self Care | Payer: Medicaid Other | Source: Ambulatory Visit | Attending: Obstetrics & Gynecology | Admitting: Obstetrics & Gynecology

## 2016-11-13 DIAGNOSIS — Z3A01 Less than 8 weeks gestation of pregnancy: Secondary | ICD-10-CM | POA: Diagnosis not present

## 2016-11-13 DIAGNOSIS — Z3491 Encounter for supervision of normal pregnancy, unspecified, first trimester: Secondary | ICD-10-CM

## 2016-11-13 DIAGNOSIS — O26891 Other specified pregnancy related conditions, first trimester: Secondary | ICD-10-CM | POA: Insufficient documentation

## 2016-11-13 DIAGNOSIS — Z87891 Personal history of nicotine dependence: Secondary | ICD-10-CM | POA: Insufficient documentation

## 2016-11-13 DIAGNOSIS — B9689 Other specified bacterial agents as the cause of diseases classified elsewhere: Secondary | ICD-10-CM | POA: Diagnosis not present

## 2016-11-13 DIAGNOSIS — R109 Unspecified abdominal pain: Secondary | ICD-10-CM | POA: Insufficient documentation

## 2016-11-13 DIAGNOSIS — N760 Acute vaginitis: Secondary | ICD-10-CM | POA: Insufficient documentation

## 2016-11-13 DIAGNOSIS — O26899 Other specified pregnancy related conditions, unspecified trimester: Secondary | ICD-10-CM

## 2016-11-13 LAB — CBC
HCT: 34.8 % — ABNORMAL LOW (ref 36.0–46.0)
Hemoglobin: 12 g/dL (ref 12.0–15.0)
MCH: 31.1 pg (ref 26.0–34.0)
MCHC: 34.5 g/dL (ref 30.0–36.0)
MCV: 90.2 fL (ref 78.0–100.0)
Platelets: 225 K/uL (ref 150–400)
RBC: 3.86 MIL/uL — ABNORMAL LOW (ref 3.87–5.11)
RDW: 12.4 % (ref 11.5–15.5)
WBC: 9.1 K/uL (ref 4.0–10.5)

## 2016-11-13 LAB — URINALYSIS, ROUTINE W REFLEX MICROSCOPIC
Bacteria, UA: NONE SEEN
Bilirubin Urine: NEGATIVE
GLUCOSE, UA: NEGATIVE mg/dL
Hgb urine dipstick: NEGATIVE
Ketones, ur: 5 mg/dL — AB
Nitrite: NEGATIVE
PH: 5 (ref 5.0–8.0)
PROTEIN: 30 mg/dL — AB
SPECIFIC GRAVITY, URINE: 1.028 (ref 1.005–1.030)

## 2016-11-13 LAB — WET PREP, GENITAL
Sperm: NONE SEEN
Trich, Wet Prep: NONE SEEN
Yeast Wet Prep HPF POC: NONE SEEN

## 2016-11-13 LAB — ABO/RH: ABO/RH(D): A POS

## 2016-11-13 LAB — HCG, QUANTITATIVE, PREGNANCY: hCG, Beta Chain, Quant, S: 62093 m[IU]/mL — ABNORMAL HIGH (ref <5)

## 2016-11-13 MED ORDER — METRONIDAZOLE 500 MG PO TABS: 500.0000 mg | ORAL_TABLET | Freq: Two times a day (BID) | ORAL | 0 refills | Status: DC

## 2016-11-13 NOTE — Discharge Instructions (Signed)
First Trimester of Pregnancy The first trimester of pregnancy is from week 1 until the end of week 13 (months 1 through 3). During this time, your baby will begin to develop inside you. At 6-8 weeks, the eyes and face are formed, and the heartbeat can be seen on ultrasound. At the end of 12 weeks, all the baby's organs are formed. Prenatal care is all the medical care you receive before the birth of your baby. Make sure you get good prenatal care and follow all of your doctor's instructions. Follow these instructions at home: Medicines  Take over-the-counter and prescription medicines only as told by your doctor. Some medicines are safe and some medicines are not safe during pregnancy.  Take a prenatal vitamin that contains at least 600 micrograms (mcg) of folic acid.  If you have trouble pooping (constipation), take medicine that will make your stool soft (stool softener) if your doctor approves. Eating and drinking  Eat regular, healthy meals.  Your doctor will tell you the amount of weight gain that is right for you.  Avoid raw meat and uncooked cheese.  If you feel sick to your stomach (nauseous) or throw up (vomit): ? Eat 4 or 5 small meals a day instead of 3 large meals. ? Try eating a few soda crackers. ? Drink liquids between meals instead of during meals.  To prevent constipation: ? Eat foods that are high in fiber, like fresh fruits and vegetables, whole grains, and beans. ? Drink enough fluids to keep your pee (urine) clear or pale yellow. Activity  Exercise only as told by your doctor. Stop exercising if you have cramps or pain in your lower belly (abdomen) or low back.  Do not exercise if it is too hot, too humid, or if you are in a place of great height (high altitude).  Try to avoid standing for long periods of time. Move your legs often if you must stand in one place for a long time.  Avoid heavy lifting.  Wear low-heeled shoes. Sit and stand up straight.  You  can have sex unless your doctor tells you not to. Relieving pain and discomfort  Wear a good support bra if your breasts are sore.  Take warm water baths (sitz baths) to soothe pain or discomfort caused by hemorrhoids. Use hemorrhoid cream if your doctor says it is okay.  Rest with your legs raised if you have leg cramps or low back pain.  If you have puffy, bulging veins (varicose veins) in your legs: ? Wear support hose or compression stockings as told by your doctor. ? Raise (elevate) your feet for 15 minutes, 3-4 times a day. ? Limit salt in your food. Prenatal care  Schedule your prenatal visits by the twelfth week of pregnancy.  Write down your questions. Take them to your prenatal visits.  Keep all your prenatal visits as told by your doctor. This is important. Safety  Wear your seat belt at all times when driving.  Make a list of emergency phone numbers. The list should include numbers for family, friends, the hospital, and police and fire departments. General instructions  Ask your doctor for a referral to a local prenatal class. Begin classes no later than at the start of month 6 of your pregnancy.  Ask for help if you need counseling or if you need help with nutrition. Your doctor can give you advice or tell you where to go for help.  Do not use hot tubs, steam rooms, or  saunas.  Do not douche or use tampons or scented sanitary pads.  Do not cross your legs for long periods of time.  Avoid all herbs and alcohol. Avoid drugs that are not approved by your doctor.  Do not use any tobacco products, including cigarettes, chewing tobacco, and electronic cigarettes. If you need help quitting, ask your doctor. You may get counseling or other support to help you quit.  Avoid cat litter boxes and soil used by cats. These carry germs that can cause birth defects in the baby and can cause a loss of your baby (miscarriage) or stillbirth.  Visit your dentist. At home, brush  your teeth with a soft toothbrush. Be gentle when you floss. Contact a doctor if:  You are dizzy.  You have mild cramps or pressure in your lower belly.  You have a nagging pain in your belly area.  You continue to feel sick to your stomach, you throw up, or you have watery poop (diarrhea).  You have a bad smelling fluid coming from your vagina.  You have pain when you pee (urinate).  You have increased puffiness (swelling) in your face, hands, legs, or ankles. Get help right away if:  You have a fever.  You are leaking fluid from your vagina.  You have spotting or bleeding from your vagina.  You have very bad belly cramping or pain.  You gain or lose weight rapidly.  You throw up blood. It may look like coffee grounds.  You are around people who have [place] measles, fifth disease, or chickenpox.  You have a very bad headache.  You have shortness of breath.  You have any kind of trauma, such as from a fall or a car accident. Summary  The first trimester of pregnancy is from week 1 until the end of week 13 (months 1 through 3).  To take care of yourself and your unborn baby, you will need to eat healthy meals, take medicines only if your doctor tells you to do so, and do activities that are safe for you and your baby.  Keep all follow-up visits as told by your doctor. This is important as your doctor will have to ensure that your baby is healthy and growing well. This information is not intended to replace advice given to you by your health care provider. Make sure you discuss any questions you have with your health care provider. Document Released: 08/17/2007 Document Revised: 03/08/2016 Document Reviewed: 03/08/2016 Elsevier Interactive Patient Education  2017 Elsevier Inc.     Bacterial Vaginosis Bacterial vaginosis is a vaginal infection that occurs when the normal balance of bacteria in the vagina is disrupted. It results from an overgrowth of certain  bacteria. This is the most common vaginal infection among women ages 4415-44. Because bacterial vaginosis increases your risk for STIs (sexually transmitted infections), getting treated can help reduce your risk for chlamydia, gonorrhea, herpes, and HIV (human immunodeficiency virus). Treatment is also important for preventing complications in pregnant women, because this condition can cause an early (premature) delivery. What are the causes? This condition is caused by an increase in harmful bacteria that are normally present in small amounts in the vagina. However, the reason that the condition develops is not fully understood. What increases the risk? The following factors may make you more likely to develop this condition:  Having a new sexual partner or multiple sexual partners.  Having unprotected sex.  Douching.  Having an intrauterine device (IUD).  Smoking.  Drug and  alcohol abuse.  Taking certain antibiotic medicines.  Being pregnant.  You cannot get bacterial vaginosis from toilet seats, bedding, swimming pools, or contact with objects around you. What are the signs or symptoms? Symptoms of this condition include:  Grey or white vaginal discharge. The discharge can also be watery or foamy.  A fish-like odor with discharge, especially after sexual intercourse or during menstruation.  Itching in and around the vagina.  Burning or pain with urination.  Some women with bacterial vaginosis have no signs or symptoms. How is this diagnosed? This condition is diagnosed based on:  Your medical history.  A physical exam of the vagina.  Testing a sample of vaginal fluid under a microscope to look for a large amount of bad bacteria or abnormal cells. Your health care provider may use a cotton swab or a small wooden spatula to collect the sample.  How is this treated? This condition is treated with antibiotics. These may be given as a pill, a vaginal cream, or a medicine  that is put into the vagina (suppository). If the condition comes back after treatment, a second round of antibiotics may be needed. Follow these instructions at home: Medicines  Take over-the-counter and prescription medicines only as told by your health care provider.  Take or use your antibiotic as told by your health care provider. Do not stop taking or using the antibiotic even if you start to feel better. General instructions  If you have a female sexual partner, tell her that you have a vaginal infection. She should see her health care provider and be treated if she has symptoms. If you have a female sexual partner, he does not need treatment.  During treatment: ? Avoid sexual activity until you finish treatment. ? Do not douche. ? Avoid alcohol as directed by your health care provider. ? Avoid breastfeeding as directed by your health care provider.  Drink enough water and fluids to keep your urine clear or pale yellow.  Keep the area around your vagina and rectum clean. ? Wash the area daily with warm water. ? Wipe yourself from front to back after using the toilet.  Keep all follow-up visits as told by your health care provider. This is important. How is this prevented?  Do not douche.  Wash the outside of your vagina with warm water only.  Use protection when having sex. This includes latex condoms and dental dams.  Limit how many sexual partners you have. To help prevent bacterial vaginosis, it is best to have sex with just one partner (monogamous).  Make sure you and your sexual partner are tested for STIs.  Wear cotton or cotton-lined underwear.  Avoid wearing tight pants and pantyhose, especially during summer.  Limit the amount of alcohol that you drink.  Do not use any products that contain nicotine or tobacco, such as cigarettes and e-cigarettes. If you need help quitting, ask your health care provider.  Do not use illegal drugs. Where to find more  information:  Centers for Disease Control and Prevention: SolutionApps.co.za  American Sexual Health Association (ASHA): www.ashastd.org  U.S. Department of Health and Health and safety inspector, Office on Women's Health: ConventionalMedicines.si or http://www.anderson-williamson.info/ Contact a health care provider if:  Your symptoms do not improve, even after treatment.  You have more discharge or pain when urinating.  You have a fever.  You have pain in your abdomen.  You have pain during sex.  You have vaginal bleeding between periods. Summary  Bacterial vaginosis is a vaginal  infection that occurs when the normal balance of bacteria in the vagina is disrupted.  Because bacterial vaginosis increases your risk for STIs (sexually transmitted infections), getting treated can help reduce your risk for chlamydia, gonorrhea, herpes, and HIV (human immunodeficiency virus). Treatment is also important for preventing complications in pregnant women, because the condition can cause an early (premature) delivery.  This condition is treated with antibiotic medicines. These may be given as a pill, a vaginal cream, or a medicine that is put into the vagina (suppository). This information is not intended to replace advice given to you by your health care provider. Make sure you discuss any questions you have with your health care provider. Document Released: 02/28/2005 Document Revised: 11/14/2015 Document Reviewed: 11/14/2015 Elsevier Interactive Patient Education  2017 Elsevier [employer].    [facility] Prenatal Care Providers   Center for [facility]       Phone: [phone]  Center for [facility] [place] Phone: [phone]  Center for [employer] [facility] [place]  Phone: [phone]  Center for [facility] at [facility]  Phone: [phone]  Center for [facility] at [place]  Phone: [phone]  [facility]       Phone: [phone]  [facility]    Phone: [phone]   [facility])    Phone: [phone]  [facility]    Phone: [phone]  [facility]    Phone: [phone]   [facility]  Phone: [phone]  [facility]    Phone: [phone]  [facility]   Phone: [phone]  [facility]    Phone: [phone]

## 2016-11-13 NOTE — MAU Provider Note (Signed)
History     CSN: [id number]  Arrival date and [id number]: 11/13/16 1051  First Provider Initiated Contact with Patient 11/13/16 1122      Chief Complaint  Patient presents with  . Abdominal Pain   HPI Tracy [name] is a 21 y.o. G1P0 at 2371w4d who presents with pain. Pain started yesterday in lower abdomen. Describes cramp like pain that is intermittent. Rates pain 8/10. Has not treated. Nothing makes better or worse. Denies n/v/d, dysuria, vaginal discharge, or vaginal bleeding.   OB History    Gravida Para Term Preterm AB Living   1             SAB TAB Ectopic Multiple Live Births                  Past Medical History:  Diagnosis Date  . Medical history non-contributory     Past Surgical History:  Procedure Laterality Date  . NO PAST SURGERIES      Family History  Problem Relation Age of Onset  . Thyroid disease Maternal Aunt     Social History  Substance Use Topics  . Smoking status: Former Games developermoker  . Smokeless tobacco: Never Used     Comment: about a year ago was last use  . Alcohol use No    Allergies: No Known Allergies  Prescriptions Prior to Admission  Medication Sig Dispense Refill Last Dose  . amoxicillin-clavulanate (AUGMENTIN) 875-125 MG tablet Take 1 tablet by mouth 2 (two) times daily. (Patient not taking: Reported on 11/13/2016) 10 tablet 0 Completed Course at Unknown time  . ibuprofen (ADVIL,MOTRIN) 600 MG tablet Take 1 tablet (600 mg total) by mouth every 8 (eight) hours as needed. (Patient not taking: Reported on 11/13/2016) 15 tablet 0 Not Taking at Unknown time    Review of Systems  Constitutional: Negative.   Gastrointestinal: Positive for abdominal pain. Negative for constipation, diarrhea, nausea and vomiting.  Genitourinary: Negative.    Physical Exam   Blood pressure 118/68, pulse 60, temperature 98.4 F (36.9 C), temperature source Oral, resp. rate 16, height 5\' 2"  (1.575 m), weight 120 lb (54.4 kg), last menstrual period 10/05/2016, SpO2  100 %.  Physical Exam  Nursing note and vitals reviewed. Constitutional: She is oriented to person, place, and time. She appears well-developed and well-nourished. No distress.  HENT:  Head: Normocephalic and atraumatic.  Eyes: Conjunctivae are normal. Right eye exhibits no discharge. Left eye exhibits no discharge. No scleral icterus.  Neck: Normal range of motion.  Respiratory: Effort normal. No respiratory distress.  GI: Soft. Bowel sounds are normal. She exhibits no distension. There is no tenderness. There is no rebound and no guarding.  Neurological: She is alert and oriented to person, place, and time.  Skin: Skin is warm and dry. She is not diaphoretic.  Psychiatric: She has a normal mood and affect. Her behavior is normal. Judgment and thought content normal.    MAU Course  Procedures Results for orders placed or performed during the hospital encounter of 11/13/16 (from the past 24 hour(s))  Urinalysis, Routine w reflex microscopic     Status: Abnormal   Collection Time: 11/13/16 10:55 AM  Result Value Ref Range   Color, Urine YELLOW YELLOW   APPearance HAZY (A) CLEAR   Specific Gravity, Urine 1.028 1.005 - 1.030   pH 5.0 5.0 - 8.0   Glucose, UA NEGATIVE NEGATIVE mg/dL   Hgb urine dipstick NEGATIVE NEGATIVE   Bilirubin Urine NEGATIVE NEGATIVE   Ketones, ur  5 (A) NEGATIVE mg/dL   Protein, ur 30 (A) NEGATIVE mg/dL   Nitrite NEGATIVE NEGATIVE   Leukocytes, UA MODERATE (A) NEGATIVE   RBC / HPF 0-5 0 - 5 RBC/hpf   WBC, UA 6-30 0 - 5 WBC/hpf   Bacteria, UA NONE SEEN NONE SEEN   Squamous Epithelial / LPF 6-30 (A) NONE SEEN   Mucus PRESENT   CBC     Status: Abnormal   Collection Time: 11/13/16 11:36 AM  Result Value Ref Range   WBC 9.1 4.0 - 10.5 K/uL   RBC 3.86 (L) 3.87 - 5.11 MIL/uL   Hemoglobin 12.0 12.0 - 15.0 g/dL   HCT 16.1 (L) 09.6 - 04.5 %   MCV 90.2 78.0 - 100.0 fL   MCH 31.1 26.0 - 34.0 pg   MCHC 34.5 30.0 - 36.0 g/dL   RDW 40.9 81.1 - 91.4 %   Platelets  225 150 - 400 K/uL  ABO/Rh     Status: None   Collection Time: 11/13/16 11:36 AM  Result Value Ref Range   ABO/RH(D) A POS   hCG, quantitative, pregnancy     Status: Abnormal   Collection Time: 11/13/16 11:36 AM  Result Value Ref Range   hCG, Beta Chain, Quant, S 62,093 (H) <5 mIU/mL  Wet prep, genital     Status: Abnormal   Collection Time: 11/13/16 12:25 PM  Result Value Ref Range   Yeast Wet Prep HPF POC NONE SEEN NONE SEEN   Trich, Wet Prep NONE SEEN NONE SEEN   Clue Cells Wet Prep HPF POC PRESENT (A) NONE SEEN   WBC, Wet Prep HPF POC MANY (A) NONE SEEN   Sperm NONE SEEN    US Ob Comp Less 14 Wks  Result Date: 11/13/2016 CLINICAL DATA:  Abdominal pain affecting pregnancy. First-trimester. EXAM: OBSTETRIC <14 WK [place] AND TRANSVAGINAL OB US TECHNIQUE: Both transabdominal and transvaginal ultrasound examinations were performed for complete evaluation of the gestation as well as the maternal uterus, adnexal regions, and pelvic cul-de-sac. Transvaginal technique was performed to assess early pregnancy. COMPARISON:  Pelvic ultrasound 05/19/2016 FINDINGS: Intrauterine gestational sac: Single Yolk sac:  Present Embryo:  Present Cardiac Activity: Present Heart Rate: 95  bpm CRL:  3.1  mm   5 w   6 d                  [place] EDC: 07/10/2017 Subchorionic hemorrhage: A small subchorionic hemorrhage is noted. This involves less than 10% of the circumference of the gestational sac. Maternal uterus/adnexae: Within normal limits. Trace free fluid is noted. IMPRESSION: 1. Single intrauterine pregnancy with an estimated gestational age of [redacted] weeks 6 days. 2. Fetal heart rate is 95 beats per minute. 3. Small subchorionic hemorrhage, unlikely to be of clinical consequence. Electronically Signed   By: Marin Roberts M.D.   On: 11/13/2016 12:34   US Ob Transvaginal  Result Date: 11/13/2016 CLINICAL DATA:  Abdominal pain affecting pregnancy. First-trimester. EXAM: OBSTETRIC <14 WK [place] AND TRANSVAGINAL OB US  TECHNIQUE: Both transabdominal and transvaginal ultrasound examinations were performed for complete evaluation of the gestation as well as the maternal uterus, adnexal regions, and pelvic cul-de-sac. Transvaginal technique was performed to assess early pregnancy. COMPARISON:  Pelvic ultrasound 05/19/2016 FINDINGS: Intrauterine gestational sac: Single Yolk sac:  Present Embryo:  Present Cardiac Activity: Present Heart Rate: 95  bpm CRL:  3.1  mm   5 w   6 d  [place] EDC: 07/10/2017 Subchorionic hemorrhage: A small subchorionic hemorrhage is noted. This involves less than 10% of the circumference of the gestational sac. Maternal uterus/adnexae: Within normal limits. Trace free fluid is noted. IMPRESSION: 1. Single intrauterine pregnancy with an estimated gestational age of [redacted] weeks 6 days. 2. Fetal heart rate is 95 beats per minute. 3. Small subchorionic hemorrhage, unlikely to be of clinical consequence. Electronically Signed   By: Marin Roberts M.D.   On: 11/13/2016 12:34    MDM +UPT UA, wet prep, GC/chlamydia, CBC, ABO/Rh, quant hCG, HIV, and [place] today to rule out ectopic pregnancy A positive Ultrasound shows SIUP with cardiac activity Assessment and Plan  A: 1. Normal IUP (intrauterine pregnancy) on prenatal ultrasound, first trimester   2. Abdominal pain affecting pregnancy   3. BV (bacterial vaginosis)    P: Discharge home Rx flagyl Discussed reasons to return to MAU Keep follow up appointment with OB/PCP  GC/CT pending   Judeth Horn 11/13/2016, 11:22 AM

## 2016-11-13 NOTE — MAU Note (Signed)
Reports sharp lower mid abdominal pain since yesterday. Reports no bleeding.

## 2016-11-17 LAB — GC/CHLAMYDIA PROBE AMP (~~LOC~~) NOT AT ARMC
Chlamydia: NEGATIVE
NEISSERIA GONORRHEA: NEGATIVE

## 2016-12-16 ENCOUNTER — Other Ambulatory Visit (HOSPITAL_COMMUNITY)
Admission: RE | Admit: 2016-12-16 | Discharge: 2016-12-16 | Disposition: A | Discharge status: Home / Self Care | Payer: Medicaid Other | Source: Ambulatory Visit | Attending: Certified Nurse Midwife | Admitting: Certified Nurse Midwife

## 2016-12-16 ENCOUNTER — Ambulatory Visit (INDEPENDENT_AMBULATORY_CARE_PROVIDER_SITE_OTHER): Payer: Medicaid Other | Admitting: Certified Nurse Midwife

## 2016-12-16 ENCOUNTER — Encounter: Payer: Self-pay | Admitting: Certified Nurse Midwife

## 2016-12-16 DIAGNOSIS — Z3401 Encounter for supervision of normal first pregnancy, first trimester: Secondary | ICD-10-CM

## 2016-12-16 DIAGNOSIS — Z3400 Encounter for supervision of normal first pregnancy, unspecified trimester: Secondary | ICD-10-CM | POA: Insufficient documentation

## 2016-12-16 DIAGNOSIS — Z23 Encounter for immunization: Secondary | ICD-10-CM

## 2016-12-16 MED ORDER — PRENATE PIXIE 10-0.6-0.4-200 MG PO CAPS: 1.0000 | ORAL_CAPSULE | Freq: Every day | ORAL | 12 refills | Status: DC

## 2016-12-16 NOTE — Progress Notes (Signed)
NOB. Flu vaccine given. Tolerated well.

## 2016-12-16 NOTE — Progress Notes (Signed)
Subjective:    Tracy [name] is being seen today for her first obstetrical visit.  This is not a planned pregnancy. She is at [redacted]w[redacted]d gestation. Her obstetrical history is significant for none. Relationship with FOB: significant other, living together. Patient does intend to breast feed. Pregnancy history fully reviewed.  The information documented in the HPI was reviewed and verified.  Menstrual History: OB History    Gravida Para Term Preterm AB Living   1 0           SAB TAB Ectopic Multiple Live Births                   Patient's last menstrual period was 10/05/2016 (exact date).    Past Medical History:  Diagnosis Date  . Medical history non-contributory     Past Surgical History:  Procedure Laterality Date  . NO PAST SURGERIES       (Not in a hospital admission) No Known Allergies  Social History  Substance Use Topics  . Smoking status: Former Games developer  . Smokeless tobacco: Never Used     Comment: about a year ago was last use  . Alcohol use No    Family History  Problem Relation Age of Onset  . Thyroid disease Maternal Aunt   . Hypertension Maternal Aunt   . Thyroid disease Paternal Uncle   . Thyroid disease Paternal Grandmother      Review of Systems Constitutional: negative for weight loss Gastrointestinal: negative for vomiting Genitourinary:negative for genital lesions and vaginal discharge and dysuria Musculoskeletal:negative for back pain Behavioral/Psych: negative for abusive relationship, depression, illegal drug usage and tobacco use    Objective:    BP 104/70   Pulse 79   Wt 118 lb 11.2 oz (53.8 kg)   LMP 10/05/2016 (Exact Date)   BMI 21.71 kg/m  General Appearance:    Alert, cooperative, no distress, appears stated age  Head:    Normocephalic, without obvious abnormality, atraumatic  Eyes:    PERRL, conjunctiva/corneas clear, EOM's intact, fundi    benign, both eyes  Ears:    Normal TM's and external ear canals, both ears  Nose:    Nares normal, septum midline, mucosa normal, no drainage    or sinus tenderness  Throat:   Lips, mucosa, and tongue normal; teeth and gums normal  Neck:   Supple, symmetrical, trachea midline, no adenopathy;    thyroid:  no enlargement/tenderness/nodules; no carotid   bruit or JVD  Back:     Symmetric, no curvature, ROM normal, no CVA tenderness  Lungs:     Clear to auscultation bilaterally, respirations unlabored  Chest Wall:    No tenderness or deformity   Heart:    Regular rate and rhythm, S1 and S2 normal, no murmur, rub   or gallop  Breast Exam:    No tenderness, masses, or nipple abnormality  Abdomen:     Soft, non-tender, bowel sounds active all four quadrants,    no masses, no organomegaly  Genitalia:    Normal female without lesion, discharge or tenderness  Extremities:   Extremities normal, atraumatic, no cyanosis or edema  Pulses:   2+ and symmetric all extremities  Skin:   Skin color, texture, turgor normal, no rashes or lesions  Lymph nodes:   Cervical, supraclavicular, and axillary nodes normal  Neurologic:   CNII-XII intact, normal strength, sensation and reflexes    throughout     Cervix: long, thick, closed and posterior.  FHR: 160's by doppler.  Size c/w                                    dates.  Friable with pap smear    Lab Review Urine pregnancy test Labs reviewed yes Radiologic studies reviewed yes  Assessment & Plan    Pregnancy at [redacted]w[redacted]d weeks    1. Supervision of normal first pregnancy, antepartum      - Enroll Patient in Babyscripts - Obstetric Panel, Including HIV - Hemoglobinopathy evaluation - Culture, OB Urine - Cervicovaginal ancillary only - Cytology - PAP - Varicella zoster antibody, IgG - Flu Vaccine QUAD 36+ mos IM (Fluarix, Quad PF) - Inheritest Society Guided - Hemoglobin A1c - [place] MFM OB COMP + 14 WK; Future - Prenat-FeAsp-Meth-FA-DHA w/o A (PRENATE PIXIE) 10-0.6-0.4-200 MG CAPS; Take 1 tablet by mouth daily.  Dispense: 30 capsule;  Refill: 12 - MaterniT21 PLUS Core+SCA     Prenatal vitamins.  Counseling provided regarding continued use of seat belts, cessation of alcohol consumption, smoking or use of illicit drugs; infection precautions i.e., influenza/TDAP immunizations, toxoplasmosis,CMV, parvovirus, listeria and varicella; workplace safety, exercise during pregnancy; routine dental care, safe medications, sexual activity, hot tubs, saunas, pools, travel, caffeine use, fish and methlymercury, potential toxins, hair treatments, varicose veins Weight gain recommendations per IOM guidelines reviewed: underweight/BMI< 18.5--> gain 28 - 40 lbs; normal weight/BMI 18.5 - 24.9--> gain 25 - 35 lbs; overweight/BMI 25 - 29.9--> gain 15 - 25 lbs; obese/BMI >30->gain  11 - 20 lbs Problem list reviewed and updated. FIRST/CF mutation testing/NIPT/QUAD SCREEN/fragile X/Ashkenazi Jewish population testing/Spinal muscular atrophy discussed: ordered. Role of ultrasound in pregnancy discussed; fetal survey: ordered. Amniocentesis discussed: not indicated.  Meds ordered this encounter  Medications  . Prenat-FeAsp-Meth-FA-DHA w/o A (PRENATE PIXIE) 10-0.6-0.4-200 MG CAPS    Sig: Take 1 tablet by mouth daily.    Dispense:  30 capsule    Refill:  12    Please process coupon: Rx BIN: [id number], RxPCN: [id number], RxGRP: [id number], RxID: [id number]  SUF: 01   Orders Placed This Encounter  Procedures  . Culture, OB Urine  . [place] MFM OB COMP + 14 WK    Standing Status:   Future    Standing Expiration Date:   02/15/2018    Order Specific Question:   Reason for Exam (SYMPTOM  OR DIAGNOSIS REQUIRED)    Answer:   fetal anatomy scan    Order Specific Question:   Preferred imaging location?    Answer:   [facility]  . Flu Vaccine QUAD 36+ mos IM (Fluarix, Quad PF)  . Obstetric Panel, Including HIV  . Hemoglobinopathy evaluation  . Varicella zoster antibody, IgG  . Inheritest Society Guided  . Hemoglobin A1c    Follow up in 4 weeks. 50% of 30  min visit spent on counseling and coordination of care.

## 2016-12-20 ENCOUNTER — Other Ambulatory Visit: Payer: Self-pay | Admitting: Certified Nurse Midwife

## 2016-12-20 DIAGNOSIS — Z2839 Other underimmunization status: Secondary | ICD-10-CM | POA: Insufficient documentation

## 2016-12-20 DIAGNOSIS — Z283 Underimmunization status: Secondary | ICD-10-CM | POA: Insufficient documentation

## 2016-12-20 DIAGNOSIS — O09899 Supervision of other high risk pregnancies, unspecified trimester: Secondary | ICD-10-CM | POA: Insufficient documentation

## 2016-12-20 DIAGNOSIS — Z3400 Encounter for supervision of normal first pregnancy, unspecified trimester: Secondary | ICD-10-CM

## 2016-12-20 DIAGNOSIS — O9989 Other specified diseases and conditions complicating pregnancy, childbirth and the puerperium: Principal | ICD-10-CM

## 2016-12-20 LAB — OBSTETRIC PANEL, INCLUDING HIV
ANTIBODY SCREEN: NEGATIVE
BASOS ABS: 0 10*3/uL (ref 0.0–0.2)
BASOS: 0 %
EOS (ABSOLUTE): 0 10*3/uL (ref 0.0–0.4)
Eos: 0 %
HIV SCREEN 4TH GENERATION: NONREACTIVE
Hematocrit: 39.8 % (ref 34.0–46.6)
Hemoglobin: 13.4 g/dL (ref 11.1–15.9)
Hepatitis B Surface Ag: NEGATIVE
Immature Grans (Abs): 0.1 10*3/uL (ref 0.0–0.1)
Immature Granulocytes: 1 %
LYMPHS ABS: 1.9 10*3/uL (ref 0.7–3.1)
Lymphs: 20 %
MCH: 30.4 pg (ref 26.6–33.0)
MCHC: 33.7 g/dL (ref 31.5–35.7)
MCV: 90 fL (ref 79–97)
Monocytes Absolute: 0.5 10*3/uL (ref 0.1–0.9)
Monocytes: 5 %
NEUTROS ABS: 7 10*3/uL (ref 1.4–7.0)
Neutrophils: 74 %
PLATELETS: 257 10*3/uL (ref 150–379)
RBC: 4.41 x10E6/uL (ref 3.77–5.28)
RDW: 13 % (ref 12.3–15.4)
RPR: NONREACTIVE
Rh Factor: POSITIVE
Rubella Antibodies, IGG: 0.98 index — ABNORMAL LOW (ref >0.99)
WBC: 9.5 10*3/uL (ref 3.4–10.8)

## 2016-12-20 LAB — HEMOGLOBINOPATHY EVALUATION
HEMOGLOBIN A2 QUANTITATION: 1.9 % (ref 1.8–3.2)
HGB A: 98.1 % (ref 96.4–98.8)
HGB C: 0 %
HGB S: 0 %
HGB VARIANT: 0 %
Hemoglobin F Quantitation: 0 % (ref 0.0–2.0)

## 2016-12-20 LAB — HEMOGLOBIN A1C
ESTIMATED AVERAGE GLUCOSE: 97 mg/dL
HEMOGLOBIN A1C: 5 % (ref 4.8–5.6)

## 2016-12-20 LAB — CERVICOVAGINAL ANCILLARY ONLY
BACTERIAL VAGINITIS: POSITIVE — AB
CHLAMYDIA, DNA PROBE: NEGATIVE
Candida vaginitis: NEGATIVE
NEISSERIA GONORRHEA: NEGATIVE
Trichomonas: NEGATIVE

## 2016-12-20 LAB — CYTOLOGY - PAP: Diagnosis: NEGATIVE

## 2016-12-20 LAB — VARICELLA ZOSTER ANTIBODY, IGG

## 2016-12-21 ENCOUNTER — Other Ambulatory Visit: Payer: Self-pay | Admitting: Certified Nurse Midwife

## 2016-12-21 DIAGNOSIS — N760 Acute vaginitis: Principal | ICD-10-CM

## 2016-12-21 DIAGNOSIS — B9689 Other specified bacterial agents as the cause of diseases classified elsewhere: Secondary | ICD-10-CM

## 2016-12-21 MED ORDER — SECNIDAZOLE 2 G PO PACK: 1.0000 | PACK | Freq: Once | ORAL | 0 refills | Status: AC

## 2016-12-22 LAB — URINE CULTURE, OB REFLEX

## 2016-12-22 LAB — CULTURE, OB URINE

## 2016-12-23 LAB — MATERNIT21 PLUS CORE+SCA
CHROMOSOME 18: NEGATIVE
CHROMOSOME 21: NEGATIVE
Chromosome 13: NEGATIVE
Y Chromosome: NOT DETECTED

## 2016-12-26 ENCOUNTER — Encounter: Payer: Self-pay | Admitting: Certified Nurse Midwife

## 2016-12-26 ENCOUNTER — Other Ambulatory Visit: Payer: Self-pay | Admitting: Certified Nurse Midwife

## 2016-12-26 DIAGNOSIS — Z3400 Encounter for supervision of normal first pregnancy, unspecified trimester: Secondary | ICD-10-CM

## 2016-12-27 ENCOUNTER — Encounter: Payer: Self-pay | Admitting: Certified Nurse Midwife

## 2016-12-29 ENCOUNTER — Encounter: Payer: Self-pay | Admitting: Certified Nurse Midwife

## 2017-01-02 ENCOUNTER — Other Ambulatory Visit: Payer: Self-pay | Admitting: Certified Nurse Midwife

## 2017-01-02 DIAGNOSIS — Z3400 Encounter for supervision of normal first pregnancy, unspecified trimester: Secondary | ICD-10-CM

## 2017-01-02 LAB — INHERITEST SOCIETY GUIDED

## 2017-01-04 ENCOUNTER — Inpatient Hospital Stay (HOSPITAL_COMMUNITY)
Admission: AD | Admit: 2017-01-04 | Discharge: 2017-01-04 | Disposition: A | Discharge status: Home / Self Care | Payer: Medicaid Other | Source: Ambulatory Visit | Attending: Family Medicine | Admitting: Family Medicine

## 2017-01-04 ENCOUNTER — Encounter (HOSPITAL_COMMUNITY): Payer: Self-pay | Admitting: *Deleted

## 2017-01-04 DIAGNOSIS — Z3491 Encounter for supervision of normal pregnancy, unspecified, first trimester: Secondary | ICD-10-CM

## 2017-01-04 DIAGNOSIS — O9989 Other specified diseases and conditions complicating pregnancy, childbirth and the puerperium: Secondary | ICD-10-CM

## 2017-01-04 DIAGNOSIS — Z3A13 13 weeks gestation of pregnancy: Secondary | ICD-10-CM | POA: Diagnosis not present

## 2017-01-04 DIAGNOSIS — J0110 Acute frontal sinusitis, unspecified: Secondary | ICD-10-CM

## 2017-01-04 DIAGNOSIS — O99511 Diseases of the respiratory system complicating pregnancy, first trimester: Secondary | ICD-10-CM | POA: Insufficient documentation

## 2017-01-04 DIAGNOSIS — Z87891 Personal history of nicotine dependence: Secondary | ICD-10-CM | POA: Insufficient documentation

## 2017-01-04 DIAGNOSIS — R51 Headache: Secondary | ICD-10-CM | POA: Diagnosis present

## 2017-01-04 LAB — URINALYSIS, ROUTINE W REFLEX MICROSCOPIC
Bilirubin Urine: NEGATIVE
GLUCOSE, UA: NEGATIVE mg/dL
Hgb urine dipstick: NEGATIVE
Ketones, ur: NEGATIVE mg/dL
NITRITE: NEGATIVE
PH: 6 (ref 5.0–8.0)
Protein, ur: NEGATIVE mg/dL
Specific Gravity, Urine: 1.018 (ref 1.005–1.030)

## 2017-01-04 MED ORDER — BUTALBITAL-APAP-CAFFEINE 50-325-40 MG PO TABS
2.0000 | ORAL_TABLET | Freq: Once | ORAL | Status: AC
  Administered 2017-01-04: 2 via ORAL
  Filled 2017-01-04: qty 2

## 2017-01-04 MED ORDER — AMOXICILLIN-POT CLAVULANATE 875-125 MG PO TABS: 1.0000 | ORAL_TABLET | Freq: Two times a day (BID) | ORAL | 0 refills | Status: DC

## 2017-01-04 MED ORDER — PSEUDOEPHEDRINE HCL 30 MG PO TABS
60.0000 mg | ORAL_TABLET | Freq: Once | ORAL | Status: AC
  Administered 2017-01-04: 60 mg via ORAL
  Filled 2017-01-04: qty 2

## 2017-01-04 NOTE — Discharge Instructions (Signed)

## 2017-01-04 NOTE — MAU Note (Signed)
Pt reports she has been having really bad headaches for the last 3 days, states she had a flu shot on 10/05 and has not felt well since then. Reports the pain is like sinus pressure and tylenol doesn't help. Denies sore throat or cough.

## 2017-01-04 NOTE — MAU Provider Note (Signed)
History     CSN: [id number]  Arrival date and time: 01/04/17 [id number]   First Provider Initiated Contact with Patient 01/04/17 [phone]      Chief Complaint  Patient presents with  . Headache   HPI Tracy [name] is a 21 y.o. G1P0 at [redacted]w[redacted]d who presents with headache. Symptoms began 2 days ago. Reports dull throbbing pain in her forehead & between her eyes. Rates pain 9/10. Took 1 ES tylenol last night without relief. Lights & movement make pain worse. States she's had a cold for the last few weeks since getting her flu shot on 10/5. Denies sore throat, fever/chills, cough, or ear pain. Had sore throat & rhinorrhea last week.  Denies abdominal pain or vaginal bleeding.  OB History    Gravida Para Term Preterm AB Living   1 0           SAB TAB Ectopic Multiple Live Births                  Past Medical History:  Diagnosis Date  . Medical history non-contributory     Past Surgical History:  Procedure Laterality Date  . NO PAST SURGERIES      Family History  Problem Relation Age of Onset  . Thyroid disease Maternal Aunt   . Hypertension Maternal Aunt   . Thyroid disease Paternal Uncle   . Thyroid disease Paternal Grandmother     Social History  Substance Use Topics  . Smoking status: Former Games developermoker  . Smokeless tobacco: Never Used     Comment: about a year ago was last use  . Alcohol use No    Allergies: No Known Allergies  Prescriptions Prior to Admission  Medication Sig Dispense Refill Last Dose  . Prenat-FeAsp-Meth-FA-DHA w/o A (PRENATE PIXIE) 10-0.6-0.4-200 MG CAPS Take 1 tablet by mouth daily. 30 capsule 12   . [DISCONTINUED] metroNIDAZOLE (FLAGYL) 500 MG tablet Take 1 tablet (500 mg total) by mouth 2 (two) times daily. (Patient not taking: Reported on 12/16/2016) 14 tablet 0 Not Taking    Review of Systems  Constitutional: Negative.   HENT: Positive for congestion, sinus pain and sinus pressure. Negative for ear pain and sore throat.   Eyes: Positive for  photophobia. Negative for discharge and visual disturbance.  Respiratory: Negative for cough.   Gastrointestinal: Negative for abdominal pain.  Genitourinary: Negative for vaginal bleeding.  Neurological: Positive for headaches. Negative for dizziness.   Physical Exam   Blood pressure 94/60, pulse 71, temperature 98.8 F (37.1 C), temperature source Oral, resp. rate 16, height 5\' 3"  (1.6 m), weight 123 lb (55.8 kg), last menstrual period 10/05/2016, SpO2 100 %.  Physical Exam  Nursing note and vitals reviewed. Constitutional: She is oriented to person, place, and time. She appears well-developed and well-nourished. No distress.  HENT:  Head: Normocephalic and atraumatic.  Nose: Right sinus exhibits frontal sinus tenderness. Right sinus exhibits no maxillary sinus tenderness. Left sinus exhibits frontal sinus tenderness. Left sinus exhibits no maxillary sinus tenderness.  Eyes: Conjunctivae are normal. Right eye exhibits no discharge. Left eye exhibits no discharge. No scleral icterus.  Neck: Normal range of motion.  Cardiovascular: Normal rate, regular rhythm and normal heart sounds.   No murmur heard. Respiratory: Effort normal and breath sounds normal. No respiratory distress. She has no wheezes.  GI: Soft. There is no tenderness.  Neurological: She is alert and oriented to person, place, and time.  Skin: Skin is warm and dry. She is not diaphoretic.  Psychiatric: She has a normal mood and affect. Her behavior is normal. Judgment and thought content normal.    MAU Course  Procedures Results for orders placed or performed during the hospital encounter of 01/04/17 (from the past 48 hour(s))  Urinalysis, Routine w reflex microscopic     Status: Abnormal   Collection Time: 01/04/17  7:40 AM  Result Value Ref Range   Color, Urine YELLOW YELLOW   APPearance HAZY (A) CLEAR   Specific Gravity, Urine 1.018 1.005 - 1.030   pH 6.0 5.0 - 8.0   Glucose, UA NEGATIVE NEGATIVE mg/dL   Hgb  urine dipstick NEGATIVE NEGATIVE   Bilirubin Urine NEGATIVE NEGATIVE   Ketones, ur NEGATIVE NEGATIVE mg/dL   Protein, ur NEGATIVE NEGATIVE mg/dL   Nitrite NEGATIVE NEGATIVE   Leukocytes, UA MODERATE (A) NEGATIVE   RBC / HPF 0-5 0 - 5 RBC/hpf   WBC, UA 6-30 0 - 5 WBC/hpf   Bacteria, UA RARE (A) NONE SEEN   Squamous Epithelial / LPF 0-5 (A) NONE SEEN   Mucus PRESENT      MDM FHT 146 Fioricet & sudafed given for symptoms. Pt reports improvement in symptoms. VSS, NAD.  Assessment and Plan  A: 1. Acute non-recurrent frontal sinusitis   2. [redacted] weeks gestation of pregnancy   3. Fetal heart tones present, first trimester    P: Discharge home Rx augmentin Discussed OTC meds for symptomatic tx Discussed reasons to return to MAU Keep f/u with OB  Judeth Horn 01/04/2017, 8:19 AM

## 2017-01-05 ENCOUNTER — Encounter: Payer: Self-pay | Admitting: Certified Nurse Midwife

## 2017-01-05 ENCOUNTER — Telehealth: Payer: Self-pay

## 2017-01-05 NOTE — Telephone Encounter (Signed)
Pt states she is having vaginal irritation, itching, and discomfort after taking Solosec. Denies odor.  Informed pt I will consult with provider and c/b

## 2017-01-09 ENCOUNTER — Other Ambulatory Visit: Payer: Self-pay | Admitting: Certified Nurse Midwife

## 2017-01-09 DIAGNOSIS — B373 Candidiasis of vulva and vagina: Secondary | ICD-10-CM

## 2017-01-09 DIAGNOSIS — B3731 Acute candidiasis of vulva and vagina: Secondary | ICD-10-CM

## 2017-01-09 MED ORDER — FLUCONAZOLE 200 MG PO TABS: 200.0000 mg | ORAL_TABLET | Freq: Once | ORAL | 0 refills | Status: AC

## 2017-01-09 MED ORDER — TERCONAZOLE 0.8 % VA CREA: 1.0000 | TOPICAL_CREAM | Freq: Every day | VAGINAL | 0 refills | Status: DC

## 2017-01-09 NOTE — Telephone Encounter (Signed)
Please let her know that she probably has a yeast infection.  Diflucan and Terconazole vaginal cream was sent to the pharmacy for her to use.  Thank you.

## 2017-01-09 NOTE — Telephone Encounter (Addendum)
TC to pt; unable to LM, vm not set up. My Chart message sent to pt.

## 2017-01-13 ENCOUNTER — Ambulatory Visit (INDEPENDENT_AMBULATORY_CARE_PROVIDER_SITE_OTHER): Payer: Medicaid Other | Admitting: Certified Nurse Midwife

## 2017-01-13 VITALS — BP 100/64 | HR 79 | Wt 122.6 lb

## 2017-01-13 DIAGNOSIS — O09899 Supervision of other high risk pregnancies, unspecified trimester: Secondary | ICD-10-CM

## 2017-01-13 DIAGNOSIS — O9989 Other specified diseases and conditions complicating pregnancy, childbirth and the puerperium: Secondary | ICD-10-CM

## 2017-01-13 DIAGNOSIS — Z3400 Encounter for supervision of normal first pregnancy, unspecified trimester: Secondary | ICD-10-CM

## 2017-01-13 DIAGNOSIS — Z283 Underimmunization status: Secondary | ICD-10-CM

## 2017-01-13 NOTE — Progress Notes (Signed)
   PRENATAL VISIT NOTE  Subjective:  Tracy [name] is a 21 y.o. G1P0 at 78w2dbeing seen today for ongoing prenatal care.  She is currently monitored for the following issues for this low-risk pregnancy and has Heart murmur previously undiagnosed; On Depo-Provera for contraception; Supervision of normal first pregnancy, antepartum; Rubella non-immune status, antepartum; and Maternal varicella, non-immune on her problem list.  Patient reports no complaints.  Contractions: Not present. Vag. Bleeding: None.  Movement: Present. Denies leaking of fluid.   The following portions of the patient's history were reviewed and updated as appropriate: allergies, current medications, past family history, past medical history, past social history, past surgical history and problem list. Problem list updated.  Objective:   Vitals:   01/13/17 0923  BP: 100/64  Pulse: 79  Weight: 122 lb 9.6 oz (55.6 kg)    Fetal Status: Fetal Heart Rate (bpm): 150; doppler   Movement: Present     General:  Alert, oriented and cooperative. Patient is in no acute distress.  Skin: Skin is warm and dry. No rash noted.   Cardiovascular: Normal heart rate noted  Respiratory: Normal respiratory effort, no problems with respiration noted  Abdomen: Soft, gravid, appropriate for gestational age.  Pain/Pressure: Absent     Pelvic: Cervical exam deferred        Extremities: Normal range of motion.  Edema: None  Mental Status:  Normal mood and affect. Normal behavior. Normal judgment and thought content.   Assessment and Plan:  Pregnancy: G1P0 at 195w2d1. Supervision of normal first pregnancy, antepartum     Doing well  2. Maternal varicella, non-immune     Varicella postpartum  3. Rubella non-immune status, antepartum     MMR postpartum  Preterm labor symptoms and general obstetric precautions including but not limited to vaginal bleeding, contractions, leaking of fluid and fetal movement were reviewed in detail  with the patient. Please refer to After Visit Summary for other counseling recommendations.  Return in about 4 weeks (around 02/10/2017) for ROB.   RaMorene CrockerCNM

## 2017-01-16 ENCOUNTER — Encounter (HOSPITAL_COMMUNITY): Payer: Self-pay | Admitting: *Deleted

## 2017-01-16 ENCOUNTER — Inpatient Hospital Stay (HOSPITAL_COMMUNITY)
Admission: AD | Admit: 2017-01-16 | Discharge: 2017-01-16 | Disposition: A | Discharge status: Home / Self Care | Payer: Medicaid Other | Source: Ambulatory Visit | Attending: Obstetrics & Gynecology | Admitting: Obstetrics & Gynecology

## 2017-01-16 DIAGNOSIS — M545 Low back pain: Secondary | ICD-10-CM | POA: Diagnosis not present

## 2017-01-16 DIAGNOSIS — O26891 Other specified pregnancy related conditions, first trimester: Secondary | ICD-10-CM

## 2017-01-16 DIAGNOSIS — Z3A14 14 weeks gestation of pregnancy: Secondary | ICD-10-CM | POA: Insufficient documentation

## 2017-01-16 DIAGNOSIS — O26892 Other specified pregnancy related conditions, second trimester: Secondary | ICD-10-CM | POA: Insufficient documentation

## 2017-01-16 DIAGNOSIS — R109 Unspecified abdominal pain: Secondary | ICD-10-CM | POA: Diagnosis present

## 2017-01-16 DIAGNOSIS — Z87891 Personal history of nicotine dependence: Secondary | ICD-10-CM | POA: Insufficient documentation

## 2017-01-16 DIAGNOSIS — Z79899 Other long term (current) drug therapy: Secondary | ICD-10-CM | POA: Diagnosis not present

## 2017-01-16 DIAGNOSIS — O219 Vomiting of pregnancy, unspecified: Secondary | ICD-10-CM

## 2017-01-16 LAB — CBC
HCT: 31.8 % — ABNORMAL LOW (ref 36.0–46.0)
Hemoglobin: 11 g/dL — ABNORMAL LOW (ref 12.0–15.0)
MCH: 31.3 pg (ref 26.0–34.0)
MCHC: 34.6 g/dL (ref 30.0–36.0)
MCV: 90.3 fL (ref 78.0–100.0)
PLATELETS: 212 10*3/uL (ref 150–400)
RBC: 3.52 MIL/uL — ABNORMAL LOW (ref 3.87–5.11)
RDW: 12.8 % (ref 11.5–15.5)
WBC: 10.2 10*3/uL (ref 4.0–10.5)

## 2017-01-16 LAB — URINALYSIS, ROUTINE W REFLEX MICROSCOPIC
BILIRUBIN URINE: NEGATIVE
GLUCOSE, UA: NEGATIVE mg/dL
HGB URINE DIPSTICK: NEGATIVE
Ketones, ur: NEGATIVE mg/dL
NITRITE: NEGATIVE
PROTEIN: 30 mg/dL — AB
Specific Gravity, Urine: 1.023 (ref 1.005–1.030)
pH: 5 (ref 5.0–8.0)

## 2017-01-16 MED ORDER — VITAMIN B-6 25 MG PO TABS: 25.0000 mg | ORAL_TABLET | Freq: Every day | ORAL | 0 refills | Status: DC

## 2017-01-16 MED ORDER — DOXYLAMINE SUCCINATE (SLEEP) 25 MG PO TABS: 25.0000 mg | ORAL_TABLET | Freq: Every day | ORAL | 0 refills | Status: DC

## 2017-01-16 MED ORDER — ACETAMINOPHEN 325 MG PO TABS
650.0000 mg | ORAL_TABLET | Freq: Once | ORAL | Status: AC
  Administered 2017-01-16: 650 mg via ORAL
  Filled 2017-01-16: qty 2

## 2017-01-16 NOTE — Discharge Instructions (Signed)

## 2017-01-16 NOTE — MAU Note (Signed)
Pt presents to MAU with complaints of lower back pain and abdominal pain since last night. Denies any VB or abnormal discharge

## 2017-01-16 NOTE — MAU Provider Note (Signed)
History     CSN: [id number]  Arrival date and time: 01/16/17 0915   None     Chief Complaint  Patient presents with  . Abdominal Pain  . Back Pain   21 yo G1P0 at [redacted]w[redacted]d gestation presents with left lower back pain x 1 day. She denies abdominal pain but admits to "feeling like something is squirming in my stomach." Patient admits to nausea and vomiting during pregnancy and 1 episode of vomiting yesterday. Denies pain with urination, blood in urine, vaginal bleeding, fever, sweats, chills.     OB History    Gravida Para Term Preterm AB Living   1 0           SAB TAB Ectopic Multiple Live Births                  Past Medical History:  Diagnosis Date  . Medical history non-contributory     Past Surgical History:  Procedure Laterality Date  . NO PAST SURGERIES      Family History  Problem Relation Age of Onset  . Thyroid disease Maternal Aunt   . Hypertension Maternal Aunt   . Thyroid disease Paternal Uncle   . Thyroid disease Paternal Grandmother     Social History   Tobacco Use  . Smoking status: Former Games developer  . Smokeless tobacco: Never Used  . Tobacco comment: about a year ago was last use  Substance Use Topics  . Alcohol use: No    Alcohol/week: 0.0 oz  . Drug use: Yes    Types: Marijuana    Comment: last used a year ago    Allergies: No Known Allergies  Medications Prior to Admission  Medication Sig Dispense Refill Last Dose  . amoxicillin-clavulanate (AUGMENTIN) 875-125 MG tablet Take 1 tablet by mouth every 12 (twelve) hours. 14 tablet 0 Taking  . Prenat-FeAsp-Meth-FA-DHA w/o A (PRENATE PIXIE) 10-0.6-0.4-200 MG CAPS Take 1 tablet by mouth daily. 30 capsule 12 Taking    Review of Systems  Constitutional: Negative for chills and fever.  HENT: Negative for congestion and dental problem.   Eyes: Negative for discharge and itching.  Respiratory: Negative for apnea and chest tightness.   Gastrointestinal: Negative for abdominal distention and  nausea.  Endocrine: Negative for cold intolerance and heat intolerance.  Genitourinary: Positive for pelvic pain.  Musculoskeletal: Negative for arthralgias and back pain.  Neurological: Negative for dizziness and headaches.  Hematological: Negative for adenopathy. Does not bruise/bleed easily.  Psychiatric/Behavioral: Negative for agitation and behavioral problems.   Physical Exam   Blood pressure 104/63, pulse 80, temperature 97.9 F (36.6 C), resp. rate 16, weight 122 lb (55.3 kg), last menstrual period 10/05/2016.  Physical Exam  Constitutional: She is oriented to person, place, and time. She appears well-developed and well-nourished. No distress.  HENT:  Head: Normocephalic and atraumatic.  Eyes: Conjunctivae are normal. Pupils are equal, round, and reactive to light.  Neck: Normal range of motion. Neck supple.  Cardiovascular: Normal rate and intact distal pulses.  Respiratory: Effort normal. No respiratory distress.  GI: Soft. She exhibits no mass. There is tenderness. There is no rebound and no guarding.  Mild tenderness to palpation in LLQ  Musculoskeletal: Normal range of motion. She exhibits no edema.  Left sided flank tenderness to palpation  Neurological: She is alert and oriented to person, place, and time.  Skin: Skin is warm and dry.  Psychiatric: She has a normal mood and affect. Her behavior is normal.    MAU Course  Procedures  MDM Suspect UTI vs pyelonephritis given symptoms and physical exam. Urinalysis looks like dirty catch. CBC and urine culture pending.  Assessment and Plan  1. Pregnancy at [redacted] weeks gestation- follow up outpatient 2. Nausea and vomiting in pregnancy- prescribe diclegis 3. Low back pain in pregnancy- suspect UTI but dirty catch. Will wait for urine culture to treat.  Chubb Corporationmber Vickie Ponds 01/16/2017, 12:26 PM

## 2017-01-17 LAB — CULTURE, OB URINE: Culture: <10000 — AB

## 2017-02-09 ENCOUNTER — Encounter (HOSPITAL_COMMUNITY): Payer: Self-pay | Admitting: Certified Nurse Midwife

## 2017-02-10 ENCOUNTER — Encounter: Payer: Self-pay | Admitting: Certified Nurse Midwife

## 2017-02-10 ENCOUNTER — Ambulatory Visit (INDEPENDENT_AMBULATORY_CARE_PROVIDER_SITE_OTHER): Payer: Medicaid Other | Admitting: Certified Nurse Midwife

## 2017-02-10 VITALS — BP 101/62 | HR 73 | Wt 129.0 lb

## 2017-02-10 DIAGNOSIS — Z283 Underimmunization status: Secondary | ICD-10-CM

## 2017-02-10 DIAGNOSIS — Z3402 Encounter for supervision of normal first pregnancy, second trimester: Secondary | ICD-10-CM

## 2017-02-10 DIAGNOSIS — Z2839 Other underimmunization status: Secondary | ICD-10-CM

## 2017-02-10 DIAGNOSIS — O09892 Supervision of other high risk pregnancies, second trimester: Secondary | ICD-10-CM

## 2017-02-10 DIAGNOSIS — Z3400 Encounter for supervision of normal first pregnancy, unspecified trimester: Secondary | ICD-10-CM

## 2017-02-10 DIAGNOSIS — O09899 Supervision of other high risk pregnancies, unspecified trimester: Secondary | ICD-10-CM

## 2017-02-10 NOTE — Progress Notes (Signed)
   PRENATAL VISIT NOTE  Subjective:  Tracy [name] is a 21 y.o. G1P0 at 544w2d being seen today for ongoing prenatal care.  She is currently monitored for the following issues for this low-risk pregnancy and has Heart murmur previously undiagnosed; On Depo-Provera for contraception; Supervision of normal first pregnancy, antepartum; Rubella non-immune status, antepartum; and Maternal varicella, non-immune on their problem list.  Patient reports no complaints.  Contractions: Not present. Vag. Bleeding: None.  Movement: Present. Denies leaking of fluid.   The following portions of the patient's history were reviewed and updated as appropriate: allergies, current medications, past family history, past medical history, past social history, past surgical history and problem list. Problem list updated.  Objective:   Vitals:   02/10/17 0916  BP: 101/62  Pulse: 73  Weight: 129 lb (58.5 kg)    Fetal Status: Fetal Heart Rate (bpm): 150; doppler Fundal Height: 18 cm Movement: Present     General:  Alert, oriented and cooperative. Patient is in no acute distress.  Skin: Skin is warm and dry. No rash noted.   Cardiovascular: Normal heart rate noted  Respiratory: Normal respiratory effort, no problems with respiration noted  Abdomen: Soft, gravid, appropriate for gestational age.  Pain/Pressure: Absent     Pelvic: Cervical exam deferred        Extremities: Normal range of motion.     Mental Status:  Normal mood and affect. Normal behavior. Normal judgment and thought content.   Assessment and Plan:  Pregnancy: G1P0 at 2044w2d  1. Supervision of normal first pregnancy, antepartum     Doing well - AFP, Serum, Open Spina Bifida  2. Maternal varicella, non-immune     Varicella postpartum  Preterm labor symptoms and general obstetric precautions including but not limited to vaginal bleeding, contractions, leaking of fluid and fetal movement were reviewed in detail with the patient. Please  refer to After Visit Summary for other counseling recommendations.  Return in about 4 weeks (around 03/10/2017) for ROB.   Roe Coombsachelle A Rahim Astorga, CNM

## 2017-02-15 ENCOUNTER — Other Ambulatory Visit: Payer: Self-pay | Admitting: Certified Nurse Midwife

## 2017-02-15 ENCOUNTER — Ambulatory Visit (HOSPITAL_COMMUNITY)
Admission: RE | Admit: 2017-02-15 | Discharge: 2017-02-15 | Disposition: A | Discharge status: Home / Self Care | Payer: Medicaid Other | Source: Ambulatory Visit | Attending: Certified Nurse Midwife | Admitting: Certified Nurse Midwife

## 2017-02-15 DIAGNOSIS — Z3A19 19 weeks gestation of pregnancy: Secondary | ICD-10-CM | POA: Insufficient documentation

## 2017-02-15 DIAGNOSIS — Z3400 Encounter for supervision of normal first pregnancy, unspecified trimester: Secondary | ICD-10-CM

## 2017-02-15 DIAGNOSIS — Z3689 Encounter for other specified antenatal screening: Secondary | ICD-10-CM

## 2017-02-15 LAB — AFP, SERUM, OPEN SPINA BIFIDA
AFP MoM: 0.97
AFP Value: 52.3 ng/mL
GEST. AGE ON COLLECTION DATE: 18.3 wk
Maternal Age At EDD: 22.4 yr
OSBR Risk 1 IN: 10000
TEST RESULTS AFP: NEGATIVE
WEIGHT: 129 [lb_av]

## 2017-02-17 ENCOUNTER — Other Ambulatory Visit: Payer: Self-pay | Admitting: Certified Nurse Midwife

## 2017-02-17 DIAGNOSIS — Z3400 Encounter for supervision of normal first pregnancy, unspecified trimester: Secondary | ICD-10-CM

## 2017-02-18 ENCOUNTER — Encounter (HOSPITAL_COMMUNITY): Payer: Self-pay | Admitting: Emergency Medicine

## 2017-02-18 ENCOUNTER — Emergency Department (HOSPITAL_COMMUNITY)
Admission: EM | Admit: 2017-02-18 | Discharge: 2017-02-18 | Disposition: A | Discharge status: Home / Self Care | Payer: Medicaid Other | Attending: Emergency Medicine | Admitting: Emergency Medicine

## 2017-02-18 DIAGNOSIS — O9A212 Injury, poisoning and certain other consequences of external causes complicating pregnancy, second trimester: Secondary | ICD-10-CM | POA: Insufficient documentation

## 2017-02-18 DIAGNOSIS — M545 Low back pain, unspecified: Secondary | ICD-10-CM

## 2017-02-18 DIAGNOSIS — Z3A18 18 weeks gestation of pregnancy: Secondary | ICD-10-CM | POA: Diagnosis not present

## 2017-02-18 DIAGNOSIS — Z87891 Personal history of nicotine dependence: Secondary | ICD-10-CM | POA: Insufficient documentation

## 2017-02-18 NOTE — ED Triage Notes (Addendum)
Pt states she fell and hit her lower back. Did not hit stomach. Pt is [redacted] weeks pregnant. No vaginal bleeding since the fall. Pain is to right lower back. Fetal heart tones 159 with good fetal movement

## 2017-02-18 NOTE — Discharge Instructions (Signed)
Follow up with your GYN 

## 2017-02-18 NOTE — ED Provider Notes (Signed)
[facility] Provider Note   CSN: [id number] Arrival date & time: 02/18/17  1121     History   Chief Complaint Chief Complaint  Patient presents with  . Back Pain  . Fall  . pregnant    HPI Tracy [name] is a 21 y.o. female. Chief complaint is fall, back pain, pregnant.  HPI: This is a 21 year old female. [redacted] weeks pregnant by date. Fell at work. With straight backwards onto her buttocks in a seating position. It rolled backwards onto her shoulders. Did not strike her head. Did not strike her abdomen. She is a positive. No bleeding. No loss of fluid. Abdominal pain or contractions.  Past Medical History:  Diagnosis Date  . Medical history non-contributory     Patient Active Problem List   Diagnosis Date Noted  . Rubella non-immune status, antepartum 12/20/2016  . Maternal varicella, non-immune 12/20/2016  . Supervision of normal first pregnancy, antepartum 12/16/2016  . Heart murmur previously undiagnosed 10/07/2014  . On Depo-Provera for contraception 10/07/2014    Past Surgical History:  Procedure Laterality Date  . WISDOM TOOTH EXTRACTION      OB History    Gravida Para Term Preterm AB Living   1 0           SAB TAB Ectopic Multiple Live Births                   Home Medications    Prior to Admission medications   Medication Sig Start Date End Date Taking? Authorizing Provider  amoxicillin-clavulanate (AUGMENTIN) 875-125 MG tablet Take 1 tablet by mouth every 12 (twelve) hours. Patient not taking: Reported on 01/16/2017 01/04/17   Judeth HornLawrence, Erin, NP  doxylamine, Sleep, (UNISOM) 25 MG tablet Take 1 tablet (25 mg total) at bedtime by mouth. 01/16/17   Moss, Amber, DO  Prenat-FeAsp-Meth-FA-DHA w/o A (PRENATE PIXIE) 10-0.6-0.4-200 MG CAPS Take 1 tablet by mouth daily. 12/16/16   Orvilla Cornwallenney, Rachelle A, CNM  vitamin B-6 (PYRIDOXINE) 25 MG tablet Take 1 tablet (25 mg total) daily by mouth. 01/16/17   Rolm BookbinderMoss, Amber, DO     Family History Family History  Problem Relation Age of Onset  . Thyroid disease Maternal Aunt   . Hypertension Maternal Aunt   . Thyroid disease Paternal Uncle   . Thyroid disease Paternal Grandmother     Social History Social History   Tobacco Use  . Smoking status: Former Games developermoker  . Smokeless tobacco: Never Used  . Tobacco comment: about a year ago was last use  Substance Use Topics  . Alcohol use: No    Alcohol/week: 0.0 oz  . Drug use: No    Comment: last used a year ago     Allergies   Patient has no known allergies.   Review of Systems Review of Systems  Constitutional: Negative for appetite change, chills, diaphoresis, fatigue and fever.  HENT: Negative for mouth sores, sore throat and trouble swallowing.   Eyes: Negative for visual disturbance.  Respiratory: Negative for cough, chest tightness, shortness of breath and wheezing.   Cardiovascular: Negative for chest pain.  Gastrointestinal: Negative for abdominal distention, abdominal pain, diarrhea, nausea and vomiting.  Endocrine: Negative for polydipsia, polyphagia and polyuria.  Genitourinary: Negative for dysuria, frequency and hematuria.  Musculoskeletal: Positive for back pain. Negative for gait problem.  Skin: Negative for color change, pallor and rash.  Neurological: Negative for dizziness, syncope, light-headedness and headaches.  Hematological: Does not bruise/bleed easily.  Psychiatric/Behavioral: Negative for  behavioral problems and confusion.     Physical Exam Updated Vital Signs BP (!) 105/52 (BP Location: Right Arm)   Pulse 85   Temp 97.7 F (36.5 C) (Oral)   Resp 16   Ht 5\' 3"  (1.6 m)   Wt 58.5 kg (129 lb)   LMP 10/05/2016 (Exact Date)   SpO2 100%   BMI 22.85 kg/m   Physical Exam  Constitutional: She is oriented to person, place, and time. She appears well-developed and well-nourished. No distress.  HENT:  Head: Normocephalic.  Eyes: Conjunctivae are normal. Pupils are equal,  round, and reactive to light. No scleral icterus.  Neck: Normal range of motion. Neck supple. No thyromegaly present.  Cardiovascular: Normal rate and regular rhythm. Exam reveals no gallop and no friction rub.  No murmur heard. Pulmonary/Chest: Effort normal and breath sounds normal. No respiratory distress. She has no wheezes. She has no rales.  Abdominal: Soft. Bowel sounds are normal. She exhibits no distension. There is no tenderness. There is no rebound.  Musculoskeletal: Normal range of motion.  To palpate the bilateral paraspinal musculature. No midline pain. Normal neurological exam with normal strength and sensation of the bilateral cavities. Nontender over the abdomen.  Neurological: She is alert and oriented to person, place, and time.  Skin: Skin is warm and dry. No rash noted.  Psychiatric: She has a normal mood and affect. Her behavior is normal.     ED Treatments / Results  Labs (all labs ordered are listed, but only abnormal results are displayed) Labs Reviewed - No data to display  EKG  EKG Interpretation None       Radiology No results found.  Procedures Procedures (including critical care time)  Medications Ordered in ED Medications - No data to display   Initial Impression / Assessment and Plan / ED Course  I have reviewed the triage vital signs and the nursing notes.  Pertinent labs & imaging results that were available during my care of the patient were reviewed by me and considered in my medical decision making (see chart for details).    I reviewed her chart. She is a positive. No indication for imaging. Good fetal heart tones. Reassured.  Final Clinical Impressions(s) / ED Diagnoses   Final diagnoses:  Acute bilateral low back pain without sciatica    ED Discharge Orders    None       Rolland PorterJames, Donnavan Covault, MD 02/18/17 1430

## 2017-03-08 ENCOUNTER — Encounter: Payer: Self-pay | Admitting: Advanced Practice Midwife

## 2017-03-08 ENCOUNTER — Encounter: Payer: Self-pay | Admitting: Certified Nurse Midwife

## 2017-03-10 ENCOUNTER — Encounter: Payer: Medicaid Other | Admitting: Advanced Practice Midwife

## 2017-03-10 ENCOUNTER — Encounter: Payer: Self-pay | Admitting: Certified Nurse Midwife

## 2017-03-14 NOTE — L&D Delivery Note (Signed)
Patient is a 22 y.o. now G1P1 s/p NSVD at 2522w4d, who was admitted for SROM/SOL.  She progressed with augmentation (Pitocin) to complete and pushed 1hr5645minutes to deliver. Shoulder dystocia delivered by McRoberts and suprapubic pressure. Spontaneous cry on delivery of body. Cord clamping delayed by several minutes then clamped by CNM and cut by parents of baby.  Placenta intact and spontaneous, bleeding minimal.  1st degree laceration repaired without difficulty.  Mom and baby stable prior to transfer to postpartum. She plans on breastfeeding. She requests depo for birth control.  Delivery Note At 6:19 AM a viable female was delivered via Vaginal, Spontaneous (Presentation: ROA ).  APGAR: 8, 9; weight pending .   Placenta intact and spontaneous, bleeding minimal. 3V Cord:  with the following complications: Shoulder dystocia.   Anesthesia: Epidural  Episiotomy:  None Lacerations: 1st degree;Perineal Suture Repair: 3.0 vicryl Est. Blood Loss (mL):  150  Mom to postpartum.  Baby to Couplet care / Skin to Skin.  Sharyon CableVeronica C Ashleyann Shoun CNM 07/02/2017, 6:52 AM

## 2017-03-15 ENCOUNTER — Ambulatory Visit (INDEPENDENT_AMBULATORY_CARE_PROVIDER_SITE_OTHER): Payer: Medicaid Other | Admitting: Nurse Practitioner

## 2017-03-15 ENCOUNTER — Encounter: Payer: Self-pay | Admitting: Nurse Practitioner

## 2017-03-15 VITALS — BP 107/69 | HR 84 | Wt 129.6 lb

## 2017-03-15 DIAGNOSIS — Z3400 Encounter for supervision of normal first pregnancy, unspecified trimester: Secondary | ICD-10-CM

## 2017-03-15 DIAGNOSIS — Z3402 Encounter for supervision of normal first pregnancy, second trimester: Secondary | ICD-10-CM

## 2017-03-15 NOTE — Patient Instructions (Addendum)
Second Trimester of Pregnancy The second trimester is from week 13 through week 28, month 4 through 6. This is often the time in pregnancy that you feel your best. Often times, morning sickness has lessened or quit. You may have more energy, and you may get hungry more often. Your unborn baby (fetus) is growing rapidly. At the end of the sixth month, he or she is about 9 inches long and weighs about 1 pounds. You will likely feel the baby move (quickening) between 18 and 20 weeks of pregnancy. Follow these instructions at home:  Avoid all smoking, herbs, and alcohol. Avoid drugs not approved by your doctor.  Do not use any tobacco products, including cigarettes, chewing tobacco, and electronic cigarettes. If you need help quitting, ask your doctor. You may get counseling or other support to help you quit.  Only take medicine as told by your doctor. Some medicines are safe and some are not during pregnancy.  Exercise only as told by your doctor. Stop exercising if you start having cramps.  Eat regular, healthy meals.  Wear a good support bra if your breasts are tender.  Do not use hot tubs, steam rooms, or saunas.  Wear your seat belt when driving.  Avoid raw meat, uncooked cheese, and liter boxes and soil used by cats.  Take your prenatal vitamins.  Take 1500-2000 milligrams of calcium daily starting at the 20th week of pregnancy until you deliver your baby.  Try taking medicine that helps you poop (stool softener) as needed, and if your doctor approves. Eat more fiber by eating fresh fruit, vegetables, and whole grains. Drink enough fluids to keep your pee (urine) clear or pale yellow.  Take warm water baths (sitz baths) to soothe pain or discomfort caused by hemorrhoids. Use hemorrhoid cream if your doctor approves.  If you have puffy, bulging veins (varicose veins), wear support hose. Raise (elevate) your feet for 15 minutes, 3-4 times a day. Limit salt in your diet.  Avoid heavy  lifting, wear low heals, and sit up straight.  Rest with your legs raised if you have leg cramps or low back pain.  Visit your dentist if you have not gone during your pregnancy. Use a soft toothbrush to brush your teeth. Be gentle when you floss.  You can have sex (intercourse) unless your doctor tells you not to.  Go to your doctor visits. Get help if:  You feel dizzy.  You have mild cramps or pressure in your lower belly (abdomen).  You have a nagging pain in your belly area.  You continue to feel sick to your stomach (nauseous), throw up (vomit), or have watery poop (diarrhea).  You have bad smelling fluid coming from your vagina.  You have pain with peeing (urination). Get help right away if:  You have a fever.  You are leaking fluid from your vagina.  You have spotting or bleeding from your vagina.  You have severe belly cramping or pain.  You lose or gain weight rapidly.  You have trouble catching your breath and have chest pain.  You notice sudden or extreme puffiness (swelling) of your face, hands, ankles, feet, or legs.  You have not felt the baby move in over an hour.  You have severe headaches that do not go away with medicine.  You have vision changes. This information is not intended to replace advice given to you by your health care provider. Make sure you discuss any questions you have with your health care   provider. Document Released: 05/25/2009 Document Revised: 08/06/2015 Document Reviewed: 05/01/2012 Elsevier Interactive Patient Education  2017 Elsevier Inc.  

## 2017-03-15 NOTE — Progress Notes (Signed)
    Subjective:  Tracy [name] is a 22 y.o. G1P0 at 5330w0d being seen today for ongoing prenatal care.  She is currently monitored for the following issues for this low-risk pregnancy and has Heart murmur previously undiagnosed; Supervision of normal first pregnancy, antepartum; Rubella non-immune status, antepartum; and Maternal varicella, non-immune on their problem list.  Patient reports no complaints.  Contractions: Not present. Vag. Bleeding: None.  Movement: Present. Denies leaking of fluid.   The following portions of the patient's history were reviewed and updated as appropriate: allergies, current medications, past family history, past medical history, past social history, past surgical history and problem list. Problem list updated.  Objective:   Vitals:   03/15/17 1454  BP: 107/69  Pulse: 84  Weight: 129 lb 9.6 oz (58.8 kg)    Fetal Status: Fetal Heart Rate (bpm): 134; doppler Fundal Height: 24 cm Movement: Present     General:  Alert, oriented and cooperative. Patient is in no acute distress.  Skin: Skin is warm and dry. No rash noted.   Cardiovascular: Normal heart rate noted  Respiratory: Normal respiratory effort, no problems with respiration noted  Abdomen: Soft, gravid, appropriate for gestational age. Pain/Pressure: Absent     Pelvic:  Cervical exam deferred        Extremities: Normal range of motion.  Edema: None  Mental Status: Normal mood and affect. Normal behavior. Normal judgment and thought content.     Assessment and Plan:  Pregnancy: G1P0 at 2930w0d  1. Supervision of normal first pregnancy, antepartum Reviewed signing up for childbirth classes and choosing a pediatrician.  Given written info on both.  Advised she can get refills of PNV at her pharmacy.  Preterm labor symptoms and general obstetric precautions including but not limited to vaginal bleeding, contractions, leaking of fluid and fetal movement were reviewed in detail with the  patient. Please refer to After Visit Summary for other counseling recommendations.  Return in about 4 weeks (around 04/12/2017).  Nolene BernheimERRI Titan Karner, RN, MSN, NP-BC Nurse Practitioner, [facility], [facility] 03/15/2017 5:00 PM  Pt needs Rx refill on Prenatals

## 2017-03-28 ENCOUNTER — Encounter (HOSPITAL_COMMUNITY): Payer: Self-pay

## 2017-03-28 ENCOUNTER — Inpatient Hospital Stay (HOSPITAL_COMMUNITY)
Admission: AD | Admit: 2017-03-28 | Discharge: 2017-03-28 | Disposition: A | Discharge status: Home / Self Care | Payer: Medicaid Other | Source: Ambulatory Visit | Attending: Obstetrics and Gynecology | Admitting: Obstetrics and Gynecology

## 2017-03-28 DIAGNOSIS — R109 Unspecified abdominal pain: Secondary | ICD-10-CM | POA: Diagnosis present

## 2017-03-28 DIAGNOSIS — O98812 Other maternal infectious and parasitic diseases complicating pregnancy, second trimester: Secondary | ICD-10-CM | POA: Insufficient documentation

## 2017-03-28 DIAGNOSIS — Z3A24 24 weeks gestation of pregnancy: Secondary | ICD-10-CM | POA: Insufficient documentation

## 2017-03-28 DIAGNOSIS — Z87891 Personal history of nicotine dependence: Secondary | ICD-10-CM | POA: Diagnosis not present

## 2017-03-28 DIAGNOSIS — O26899 Other specified pregnancy related conditions, unspecified trimester: Secondary | ICD-10-CM

## 2017-03-28 DIAGNOSIS — B3731 Acute candidiasis of vulva and vagina: Secondary | ICD-10-CM

## 2017-03-28 DIAGNOSIS — Z3400 Encounter for supervision of normal first pregnancy, unspecified trimester: Secondary | ICD-10-CM

## 2017-03-28 DIAGNOSIS — B373 Candidiasis of vulva and vagina: Secondary | ICD-10-CM

## 2017-03-28 DIAGNOSIS — R102 Pelvic and perineal pain: Secondary | ICD-10-CM

## 2017-03-28 DIAGNOSIS — O26892 Other specified pregnancy related conditions, second trimester: Secondary | ICD-10-CM | POA: Diagnosis not present

## 2017-03-28 DIAGNOSIS — O98819 Other maternal infectious and parasitic diseases complicating pregnancy, unspecified trimester: Secondary | ICD-10-CM

## 2017-03-28 LAB — URINALYSIS, ROUTINE W REFLEX MICROSCOPIC
BILIRUBIN URINE: NEGATIVE
Glucose, UA: NEGATIVE mg/dL
HGB URINE DIPSTICK: NEGATIVE
Ketones, ur: NEGATIVE mg/dL
NITRITE: NEGATIVE
PH: 5 (ref 5.0–8.0)
Protein, ur: NEGATIVE mg/dL
SPECIFIC GRAVITY, URINE: 1.026 (ref 1.005–1.030)

## 2017-03-28 LAB — WET PREP, GENITAL
Clue Cells Wet Prep HPF POC: NONE SEEN
SPERM: NONE SEEN
Trich, Wet Prep: NONE SEEN

## 2017-03-28 MED ORDER — TERCONAZOLE 0.4 % VA CREA: 1.0000 | TOPICAL_CREAM | Freq: Every day | VAGINAL | 0 refills | Status: DC

## 2017-03-28 NOTE — MAU Provider Note (Signed)
History     CSN: [id number]  Arrival date and time: 03/28/17 [id number]   First Provider Initiated Contact with Patient 03/28/17 1023      Chief Complaint  Patient presents with  . Abdominal Pain   HPI Tracy [name] is a 22 y.o. G1P0 at 1949w6d who presents with sharp shooting abdominal pain that started while she was at work today. She works at [employer] and is on her feet all day. She states the pain is worse when she moves or goes from sitting to standing. She rates the pain a 8/10 when it happens, no pain now. Also reports a cloudy vaginal discharge for several weeks. Denies any bleeding or leaking of fluid. Reports good fetal movement.   OB History    Gravida Para Term Preterm AB Living   1 0           SAB TAB Ectopic Multiple Live Births                  Past Medical History:  Diagnosis Date  . Medical history non-contributory     Past Surgical History:  Procedure Laterality Date  . WISDOM TOOTH EXTRACTION      Family History  Problem Relation Age of Onset  . Thyroid disease Maternal Aunt   . Hypertension Maternal Aunt   . Thyroid disease Paternal Uncle   . Thyroid disease Paternal Grandmother     Social History   Tobacco Use  . Smoking status: Former Games developermoker  . Smokeless tobacco: Never Used  . Tobacco comment: about a year ago was last use  Substance Use Topics  . Alcohol use: No    Alcohol/week: 0.0 oz  . Drug use: No    Comment: last used a year ago    Allergies: No Known Allergies  Medications Prior to Admission  Medication Sig Dispense Refill Last Dose  . Prenat-FeAsp-Meth-FA-DHA w/o A (PRENATE PIXIE) 10-0.6-0.4-200 MG CAPS Take 1 tablet by mouth daily. 30 capsule 12 Past Week at Unknown time  . amoxicillin-clavulanate (AUGMENTIN) 875-125 MG tablet Take 1 tablet by mouth every 12 (twelve) hours. (Patient not taking: Reported on 01/16/2017) 14 tablet 0 Not Taking  . doxylamine, Sleep, (UNISOM) 25 MG tablet Take 1 tablet (25 mg total) at bedtime by  mouth. (Patient not taking: Reported on 03/15/2017) 30 tablet 0 Not Taking  . vitamin B-6 (PYRIDOXINE) 25 MG tablet Take 1 tablet (25 mg total) daily by mouth. (Patient not taking: Reported on 03/15/2017) 30 tablet 0 Not Taking    Review of Systems  Constitutional: Negative.  Negative for fatigue and fever.  HENT: Negative.   Respiratory: Negative.  Negative for shortness of breath.   Cardiovascular: Negative.  Negative for chest pain.  Gastrointestinal: Positive for abdominal pain. Negative for constipation, diarrhea, nausea and vomiting.  Genitourinary: Positive for vaginal discharge. Negative for dysuria and vaginal bleeding.  Neurological: Negative.  Negative for dizziness and headaches.   Physical Exam   Blood pressure (!) 97/58, pulse 87, temperature 98.1 F (36.7 C), temperature source Oral, resp. rate 15, height 5\' 3"  (1.6 m), weight 136 lb (61.7 kg), last menstrual period 10/05/2016, SpO2 100 %.  Physical Exam  Nursing note and vitals reviewed. Constitutional: She is oriented to person, place, and time. She appears well-developed and well-nourished. No distress.  HENT:  Head: Normocephalic.  Eyes: Pupils are equal, round, and reactive to light.  Cardiovascular: Normal rate, regular rhythm and normal heart sounds.  Respiratory: Effort normal and breath sounds normal.  No respiratory distress.  GI: Soft. Bowel sounds are normal. She exhibits no distension. There is no tenderness.  Genitourinary: Vaginal discharge found.  Neurological: She is alert and oriented to person, place, and time.  Skin: Skin is warm and dry.  Psychiatric: She has a normal mood and affect. Her behavior is normal. Judgment and thought content normal.   Dilation: Closed Effacement (%): Thick Cervical Position: Posterior Exam by:: c Hibah Odonnell cnm   Fetal Tracing:  Baseline: 150 Variability: moderate Accels: 10x10 Decels: none  Toco: none  MAU Course  Procedures Results for orders placed or performed  during the hospital encounter of 03/28/17 (from the past 24 hour(s))  Urinalysis, Routine w reflex microscopic     Status: Abnormal   Collection Time: 03/28/17 10:01 AM  Result Value Ref Range   Color, Urine YELLOW YELLOW   APPearance HAZY (A) CLEAR   Specific Gravity, Urine 1.026 1.005 - 1.030   pH 5.0 5.0 - 8.0   Glucose, UA NEGATIVE NEGATIVE mg/dL   Hgb urine dipstick NEGATIVE NEGATIVE   Bilirubin Urine NEGATIVE NEGATIVE   Ketones, ur NEGATIVE NEGATIVE mg/dL   Protein, ur NEGATIVE NEGATIVE mg/dL   Nitrite NEGATIVE NEGATIVE   Leukocytes, UA TRACE (A) NEGATIVE   RBC / HPF 0-5 0 - 5 RBC/hpf   WBC, UA 0-5 0 - 5 WBC/hpf   Bacteria, UA RARE (A) NONE SEEN   Squamous Epithelial / LPF 0-5 (A) NONE SEEN   Mucus PRESENT   Wet prep, genital     Status: Abnormal   Collection Time: 03/28/17 10:24 AM  Result Value Ref Range   Yeast Wet Prep HPF POC PRESENT (A) NONE SEEN   Trich, Wet Prep NONE SEEN NONE SEEN   Clue Cells Wet Prep HPF POC NONE SEEN NONE SEEN   WBC, Wet Prep HPF POC MODERATE (A) NONE SEEN   Sperm NONE SEEN      MDM UA Wet prep and gc/chlamydia  Assessment and Plan   1. Candidiasis of vagina during pregnancy   2. Supervision of normal first pregnancy, antepartum   3. Pain of round ligament during pregnancy    -Discharge home in stable condition -Rx for terazole sent to patient's pharmacy -Preterm labor precautions discussed -Encouraged patient to use maternity support belt while at work.  -Patient advised to follow-up with [facility] as scheduled for prenatal care -Patient may return to MAU as needed or if her condition were to change or worsen  Rolm Bookbinder CNM 03/28/2017, 11:12 AM

## 2017-03-28 NOTE — MAU Note (Signed)
Pt reports sharp lower abd pain since this am. Denies bleeding.

## 2017-03-28 NOTE — Discharge Instructions (Signed)
Vaginal Yeast infection, Adult Vaginal yeast infection is a condition that causes soreness, swelling, and redness (inflammation) of the vagina. It also causes vaginal discharge. This is a common condition. Some women get this infection frequently. What are the causes? This condition is caused by a change in the normal balance of the yeast (candida) and bacteria that live in the vagina. This change causes an overgrowth of yeast, which causes the inflammation. What increases the risk? This condition is more likely to develop in:  Women who take antibiotic medicines.  Women who have diabetes.  Women who take birth control pills.  Women who are pregnant.  Women who douche often.  Women who have a weak defense (immune) system.  Women who have been taking steroid medicines for a long time.  Women who frequently wear tight clothing.  What are the signs or symptoms? Symptoms of this condition include:  White, thick vaginal discharge.  Swelling, itching, redness, and irritation of the vagina. The lips of the vagina (vulva) may be affected as well.  Pain or a burning feeling while urinating.  Pain during sex.  How is this diagnosed? This condition is diagnosed with a medical history and physical exam. This will include a pelvic exam. Your health care provider will examine a sample of your vaginal discharge under a microscope. Your health care provider may send this sample for testing to confirm the diagnosis. How is this treated? This condition is treated with medicine. Medicines may be over-the-counter or prescription. You may be told to use one or more of the following:  Medicine that is taken orally.  Medicine that is applied as a cream.  Medicine that is inserted directly into the vagina (suppository).  Follow these instructions at home:  Take or apply over-the-counter and prescription medicines only as told by your health care provider.  Do not have sex until your health  care provider has approved. Tell your sex partner that you have a yeast infection. That person should go to his or her health care provider if he or she develops symptoms.  Do not wear tight clothes, such as pantyhose or tight pants.  Avoid using tampons until your health care provider approves.  Eat more yogurt. This may help to keep your yeast infection from returning.  Try taking a sitz bath to help with discomfort. This is a warm water bath that is taken while you are sitting down. The water should only come up to your hips and should cover your buttocks. Do this 3-4 times per day or as told by your health care provider.  Do not douche.  Wear breathable, cotton underwear.  If you have diabetes, keep your blood sugar levels under control. Contact a health care provider if:  You have a fever.  Your symptoms go away and then return.  Your symptoms do not get better with treatment.  Your symptoms get worse.  You have new symptoms.  You develop blisters in or around your vagina.  You have blood coming from your vagina and it is not your menstrual period.  You develop pain in your abdomen. This information is not intended to replace advice given to you by your health care provider. Make sure you discuss any questions you have with your health care provider. Document Released: 12/08/2004 Document Revised: 08/12/2015 Document Reviewed: 09/01/2014 [employer] Interactive Patient Education  2018 [employer] Round Ligament Pain The round ligament is a cord of muscle and tissue that helps to support the uterus. It can  become a source of pain during pregnancy if it becomes stretched or twisted as the baby grows. The pain usually begins in the second trimester of pregnancy, and it can come and go until the baby is delivered. It is not a serious problem, and it does not cause harm to the baby. Round ligament pain is usually a short, sharp, and pinching pain, but it can also be a dull,  lingering, and aching pain. The pain is felt in the lower side of the abdomen or in the groin. It usually starts deep in the groin and moves up to the outside of the hip area. Pain can occur with:  A sudden change in position.  Rolling over in bed.  Coughing or sneezing.  Physical activity.  Follow these instructions at home: Watch your condition for any changes. Take these steps to help with your pain:  When the pain starts, relax. Then try: ? Sitting down. ? Flexing your knees up to your abdomen. ? Lying on your side with one pillow under your abdomen and another pillow between your legs. ? Sitting in a warm bath for 15-20 minutes or until the pain goes away.  Take over-the-counter and prescription medicines only as told by your health care provider.  Move slowly when you sit and stand.  Avoid long walks if they cause pain.  Stop or lessen your physical activities if they cause pain.  Contact a health care provider if:  Your pain does not go away with treatment.  You feel pain in your back that you did not have before.  Your medicine is not helping. Get help right away if:  You develop a fever or chills.  You develop uterine contractions.  You develop vaginal bleeding.  You develop nausea or vomiting.  You develop diarrhea.  You have pain when you urinate. This information is not intended to replace advice given to you by your health care provider. Make sure you discuss any questions you have with your health care provider. Document Released: 12/08/2007 Document Revised: 08/06/2015 Document Reviewed: 05/07/2014 Elsevier Interactive Patient Education  2018 [employer]  Safe Medications in Pregnancy   Acne: Benzoyl Peroxide Salicylic Acid  Backache/Headache: Tylenol: 2 regular strength every 4 hours OR              2 Extra strength every 6 hours  Colds/Coughs/Allergies: Benadryl (alcohol free) 25 mg every 6 hours as needed Breath right  strips Claritin Cepacol throat lozenges Chloraseptic throat spray Cold-Eeze- up to three times per day Cough drops, alcohol free Flonase (by prescription only) Guaifenesin Mucinex Robitussin DM (plain only, alcohol free) Saline nasal spray/drops Sudafed (pseudoephedrine) & Actifed ** use only after [redacted] weeks gestation and if you do not have high blood pressure Tylenol Vicks Vaporub Zinc lozenges Zyrtec   Constipation: Colace Ducolax suppositories Fleet enema Glycerin suppositories Metamucil Milk of magnesia Miralax Senokot Smooth move tea  Diarrhea: Kaopectate Imodium A-D  *NO pepto Bismol  Hemorrhoids: Anusol Anusol HC Preparation H Tucks  Indigestion: Tums Maalox Mylanta Zantac  Pepcid  Insomnia: Benadryl (alcohol free) 25mg  every 6 hours as needed Tylenol PM Unisom, no Gelcaps  Leg Cramps: Tums MagGel  Nausea/Vomiting:  Bonine Dramamine Emetrol Ginger extract Sea bands Meclizine  Nausea medication to take during pregnancy:  Unisom (doxylamine succinate 25 mg tablets) Take one tablet daily at bedtime. If symptoms are not adequately controlled, the dose can be increased to a maximum recommended dose of two tablets daily (1/2 tablet in the morning, 1/2  tablet mid-afternoon and one at bedtime). Vitamin B6 100mg  tablets. Take one tablet twice a day (up to 200 mg per day).  Skin Rashes: Aveeno products Benadryl cream or 25mg  every 6 hours as needed Calamine Lotion 1% cortisone cream  Yeast infection: Gyne-lotrimin 7 Monistat 7   **If taking multiple medications, please check labels to avoid duplicating the same active ingredients **take medication as directed on the label ** Do not exceed 4000 mg of tylenol in 24 hours **Do not take medications that contain aspirin or ibuprofen

## 2017-03-29 LAB — GC/CHLAMYDIA PROBE AMP (~~LOC~~) NOT AT ARMC
CHLAMYDIA, DNA PROBE: NEGATIVE
NEISSERIA GONORRHEA: NEGATIVE

## 2017-04-06 ENCOUNTER — Telehealth: Payer: Self-pay | Admitting: *Deleted

## 2017-04-06 ENCOUNTER — Encounter: Payer: Self-pay | Admitting: Certified Nurse Midwife

## 2017-04-06 ENCOUNTER — Other Ambulatory Visit: Payer: Self-pay | Admitting: Certified Nurse Midwife

## 2017-04-06 NOTE — Telephone Encounter (Signed)
Pt called to office stating that she is having severe cramping and lower back pain, ? Ctx. Pt denies bleeding or LOF. Pt [facility]. Pt states she may be able to go today or tomorrow. Pt again strongly encouraged to be seen as soon as she could if symptoms continue.  Pt states understanding.

## 2017-04-12 ENCOUNTER — Encounter: Payer: Self-pay | Admitting: Certified Nurse Midwife

## 2017-04-12 ENCOUNTER — Ambulatory Visit (INDEPENDENT_AMBULATORY_CARE_PROVIDER_SITE_OTHER): Payer: Medicaid Other | Admitting: Certified Nurse Midwife

## 2017-04-12 ENCOUNTER — Other Ambulatory Visit: Payer: Self-pay

## 2017-04-12 ENCOUNTER — Other Ambulatory Visit: Payer: Medicaid Other

## 2017-04-12 VITALS — BP 107/66 | HR 76 | Wt 136.8 lb

## 2017-04-12 DIAGNOSIS — Z23 Encounter for immunization: Secondary | ICD-10-CM

## 2017-04-12 DIAGNOSIS — O9989 Other specified diseases and conditions complicating pregnancy, childbirth and the puerperium: Secondary | ICD-10-CM

## 2017-04-12 DIAGNOSIS — O09892 Supervision of other high risk pregnancies, second trimester: Secondary | ICD-10-CM

## 2017-04-12 DIAGNOSIS — O09899 Supervision of other high risk pregnancies, unspecified trimester: Secondary | ICD-10-CM

## 2017-04-12 DIAGNOSIS — Z3400 Encounter for supervision of normal first pregnancy, unspecified trimester: Secondary | ICD-10-CM

## 2017-04-12 DIAGNOSIS — Z283 Underimmunization status: Secondary | ICD-10-CM

## 2017-04-12 NOTE — Patient Instructions (Addendum)
AREA PEDIATRIC/FAMILY PRACTICE PHYSICIANS  [facility] [address], [address] [place], [place]  [address] Phone - [phone]   Fax - [phone]  [facility] [address] [place], [place] [address] Phone - [phone]   Fax - [phone]  JACK AMOS [address] [place], [place]  [address] Phone - [phone]   Fax - [phone]  [facility] [address] [place], [place]  [address] Phone - [phone]   Fax - [phone]  [facility] [address] [place], [place]  [address] Phone - [phone]   Fax - [phone]  [facility] [address] [place], [place]  [address] Phone - [phone]   Fax - [phone]  [facility] [address] [place], [place]  [address] Phone - [phone]   Fax - [phone]  [facility] [address], [address] [place], [place]  [address] Phone - [phone]   Fax - [phone]  [facility] [address] [place], [place]  [address] Phone - [phone]   Fax - [phone] [facility] [address] [place], [place]  [address] Phone - [phone]   Fax - [phone]  [facility] [address] [place], [place]  [address] Phone - [phone]   Fax - [phone]  [facility] [address] [place], [place]  [address] Phone - [phone]   Fax - [phone]  [facility] [address] [place], [place]  [address] Phone - [phone]   Fax - [phone]  SHILPA GOSRANI [address] [place], [place]  [address] Phone - [phone]  [facility] [address] [place], [place]  [address] Phone - [phone]   Fax - [phone]  [facility] [address] [place], [place]  [address] Phone - [phone]   Fax - [phone]  [facility] [address] [place], [place]  [address] Phone - [phone]   Fax - [phone]  [facility] [address] [place], [place]  [address] Phone - [phone]   Fax - [phone]   [facility] [address] [place], [place]  [address] Phone - [phone]   Fax - [phone]  [facility] [address] [place], [place]  [address] Phone - [phone]   Fax - [phone]  DAVID RUBIN [address] [place], [place]  [address] Phone - [phone]   Fax - [phone]  [facility] [address] [place], [place]  [address] Phone - [phone]   Fax - [phone]  [facility] [address] [place], [place]  [address] Phone - [phone]   Fax - [phone] [facility] [address] [place], [place]  [address] Phone - [phone]   Fax - [phone]  [facility] [address] [place], [place]  [address] Phone - [phone]   Fax - [phone]  [facility] [address] [place],   [address] Phone - [phone]   Fax - [phone]  [facility] Charlene Flemming MD [address] [place]  [address] Phone [phone]  Fax [phone]   Before Baby Comes Home Before your baby arrives it is important to:  Have   all of the supplies that you will need to care for your baby.  Know where to go if there is an emergency.  Discuss the baby's arrival with other family members.  What supplies will I need?  It is recommended that you have the following supplies: Large Items  Crib.  Crib mattress.  Rear-facing infant car seat. If possible, have a trained professional check to make sure that it is installed  correctly.  Feeding  6-8 bottles that are 4-5 oz in size.  6-8 nipples.  Bottle brush.  Sterilizer, or a large pan or kettle with a lid.  A way to boil and cool water.  If you will be breastfeeding: ? Breast pump. ? Nipple cream. ? Nursing bra. ? Breast pads. ? Breast shields.  If you will be formula feeding: ? Formula. ? Measuring cups. ? Measuring spoons.  Bathing  Mild baby soap and baby shampoo.  Petroleum jelly.  Soft cloth towel and washcloth.  Hooded towel.  Cotton balls.  Bath basin.  Other Supplies  Rectal thermometer.  Bulb syringe.  Baby wipes or washcloths for diaper changes.  Diaper bag.  Changing pad.  Clothing, including one-piece outfits and pajamas.  Baby nail clippers.  Receiving blankets.  Mattress pad and sheets for the crib.  Night-light for the baby's room.  Baby monitor.  2 or 3 pacifiers.  Either 24-36 cloth diapers and waterproof diaper covers or a box of disposable diapers. You may need to use as many as 10-12 diapers per day.  How do I prepare for an emergency? Prepare for an emergency by:  Knowing how to get to the nearest hospital.  Listing the phone numbers of your baby's health care providers near your home phone and in your cell phone.  How do I prepare my family?  Decide how to handle visitors.  If you have other children: ? Talk with them about the baby coming home. Ask them how they feel about it. ? Read a book together about being a new big brother or sister. ? Find ways to let them help you prepare for the new baby. ? Have someone ready to care for them while you are in the hospital. This information is not intended to replace advice given to you by your health care provider. Make sure you discuss any questions you have with your health care provider. Document Released: 02/11/2008 Document Revised: 08/06/2015 Document Reviewed: 02/05/2014 Elsevier Interactive Patient Education  2018 [employer]  Ball CorporationBraxton Hicks Contractions Contractions of the uterus can occur throughout pregnancy, but they are not always a sign that you are in labor. You may have practice contractions called Braxton Hicks contractions. These false labor contractions are sometimes confused with true labor. What are Deberah PeltonBraxton Hicks contractions? Braxton Hicks contractions are tightening movements that occur in the muscles of the uterus before labor. Unlike true labor contractions, these contractions do not result in opening (dilation) and thinning of the cervix. Toward the end of pregnancy (32-34 weeks), Braxton Hicks contractions can happen more often and may become stronger. These contractions are sometimes difficult to tell apart from true labor because they can be very uncomfortable. You should not feel embarrassed if you go to the hospital with false labor. Sometimes, the only way to tell if you are in true labor is for your health care provider to look for changes in the cervix. The health care provider will do a physical exam and may monitor your contractions. If you are not in true  labor, the exam should show that your cervix is not dilating and your water has not broken. If there are other health problems associated with your pregnancy, it is completely safe for you to be sent home with false labor. You may continue to have Braxton Hicks contractions until you go into true labor. How to tell the difference between true labor and false labor True labor  Contractions last 30-70 seconds.  Contractions become very regular.  Discomfort is usually felt in the top of the uterus, and it spreads to the lower abdomen and low back.  Contractions do not go away with walking.  Contractions usually become more intense and increase in frequency.  The cervix dilates and gets thinner. False labor  Contractions are usually shorter and not as strong as true labor contractions.  Contractions are usually  irregular.  Contractions are often felt in the front of the lower abdomen and in the groin.  Contractions may go away when you walk around or change positions while lying down.  Contractions get weaker and are shorter-lasting as time goes on.  The cervix usually does not dilate or become thin. Follow these instructions at home:  Take over-the-counter and prescription medicines only as told by your health care provider.  Keep up with your usual exercises and follow other instructions from your health care provider.  Eat and drink lightly if you think you are going into labor.  If Braxton Hicks contractions are making you uncomfortable: ? Change your position from lying down or resting to walking, or change from walking to resting. ? Sit and rest in a tub of warm water. ? Drink enough fluid to keep your urine pale yellow. Dehydration may cause these contractions. ? Do slow and deep breathing several times an hour.  Keep all follow-up prenatal visits as told by your health care provider. This is important. Contact a health care provider if:  You have a fever.  You have continuous pain in your abdomen. Get help right away if:  Your contractions become stronger, more regular, and closer together.  You have fluid leaking or gushing from your vagina.  You pass blood-tinged mucus (bloody show).  You have bleeding from your vagina.  You have low back pain that you never had before.  You feel your baby's head pushing down and causing pelvic pressure.  Your baby is not moving inside you as much as it used to. Summary  Contractions that occur before labor are called Braxton Hicks contractions, false labor, or practice contractions.  Braxton Hicks contractions are usually shorter, weaker, farther apart, and less regular than true labor contractions. True labor contractions usually become progressively stronger and regular and they become more frequent.  Manage discomfort from  [facility] contractions by changing position, resting in a warm bath, drinking plenty of water, or practicing deep breathing. This information is not intended to replace advice given to you by your health care provider. Make sure you discuss any questions you have with your health care provider. Document Released: 07/14/2016 Document Revised: 07/14/2016 Document Reviewed: 07/14/2016 Elsevier Interactive Patient Education  2018 Elsevier Inc.  Pain Relief During Labor and Delivery Many things can cause pain during labor and delivery, including:  Pressure on bones and ligaments due to the baby moving through the pelvis.  Stretching of tissues due to the baby moving through the birth canal.  Muscle tension due to anxiety or nervousness.  The uterus tightening (contracting) and relaxing to help move the baby.  There  are many ways to deal with the pain of labor and delivery. They include:  Taking prenatal classes. Taking these classes helps you know what to expect during your baby's birth. What you learn will increase your confidence and decrease your anxiety.  Practicing relaxation techniques or doing relaxing activities, such as: ? Focused breathing. ? Meditation. ? Visualization. ? Aroma therapy. ? Listening to your favorite music. ? Hypnosis.  Taking a warm shower or bath (hydrotherapy). This may: ? Provide comfort and relaxation. ? Lessen your perception of pain. ? Decrease the amount of pain medicine needed. ? Decrease the length of labor.  Getting a massage or counterpressure on your back.  Applying warm packs or ice packs.  Changing positions often, moving around, or using a birthing ball.  Getting: ? Pain medicine through an IV or injection into a muscle. ? Pain medicine inserted into your spinal column. ? Injections of sterile water just under the skin on your lower back (intradermal injections). ? Laughing gas (nitrous oxide).  Discuss your pain control options  with your health care provider during your prenatal visits. Explore the options offered by your hospital or birth center. What kinds of medicine are available? There are two kinds of medicines that can be used to relieve pain during labor and delivery:  Analgesics. These medicines decrease pain without causing you to lose feeling or the ability to move your muscles.  Anesthetics. These medicines block feeling in the body and can decrease your ability to move freely.  Both of these kinds of medicine can cause minor side effects, such as nausea, trouble concentrating, and sleepiness. They can also decrease the baby's heart rate before birth and affect the baby's breathing rate after birth. For this reason, health care providers are careful about when and how much medicine is given. What are specific medicines and procedures that provide pain relief? Local Anesthetics Local anesthetics are used to numb a small area of the body. They may be used along with another kind of anesthetic or used to numb the nerves of the vagina, cervix, and perineum during the second stage of labor. General Anesthetics General anesthetics cause you to lose consciousness so you do not feel pain. They are usually only used for an emergency cesarean delivery. General anesthetics are given through an IV tube and a mask. Pudendal Block A pudendal block is a form of local anesthetic. It may be used to relieve the pain associated with pushing or stretching of the perineum at the time of delivery or to further numb the perineum. A pudendal block is done by injecting numbing medicine through the vaginal wall into a nerve in the pelvis. Epidural Analgesia Epidural analgesia is given through a flexible IV catheter that is inserted into the lower back. Numbing medicine is delivered continuously to the area near your spinal column nerves (epidural space). After having this type of analgesia, you may be able to move your legs but you most  likely will not be able to walk. Depending on the amount of medicine given, you may lose all feeling in the lower half of your body, or you may retain some level of sensation, including the urge to push. Epidural analgesia can be used to provide pain relief for a vaginal birth. Spinal Block A spinal block is similar to epidural analgesia, but the medicine is injected into the spinal fluid instead of the epidural space. A spinal block is only given once. It starts to relieve pain quickly, but the pain relief  lasts only 1-6 hours. Spinal blocks can be used for cesarean deliveries. Combined Spinal-Epidural (CSE) Block A CSE block combines the effects of a spinal block and epidural analgesia. The spinal block works quickly to block all pain. The epidural analgesia provides continuous pain relief, even after the effects of the spinal block have worn off. This information is not intended to replace advice given to you by your health care provider. Make sure you discuss any questions you have with your health care provider. Document Released: 06/16/2008 Document Revised: 08/07/2015 Document Reviewed: 07/22/2015 Elsevier Interactive Patient Education  2018 [employer].  Third Trimester of Pregnancy The third trimester is from week 28 through week 40 (months 7 through 9). The third trimester is a time when the unborn baby (fetus) is growing rapidly. At the end of the ninth month, the fetus is about 20 inches in length and weighs 6-10 pounds. Body changes during your third trimester Your body will continue to go through many changes during pregnancy. The changes vary from woman to woman. During the third trimester:  Your weight will continue to increase. You can expect to gain 25-35 pounds (11-16 kg) by the end of the pregnancy.  You may begin to get stretch marks on your hips, abdomen, and breasts.  You may urinate more often because the fetus is moving lower into your pelvis and pressing on your  bladder.  You may develop or continue to have heartburn. This is caused by increased hormones that slow down muscles in the digestive tract.  You may develop or continue to have constipation because increased hormones slow digestion and cause the muscles that push waste through your intestines to relax.  You may develop hemorrhoids. These are swollen veins (varicose veins) in the rectum that can itch or be painful.  You may develop swollen, bulging veins (varicose veins) in your legs.  You may have increased body aches in the pelvis, back, or thighs. This is due to weight gain and increased hormones that are relaxing your joints.  You may have changes in your hair. These can include thickening of your hair, rapid growth, and changes in texture. Some women also have hair loss during or after pregnancy, or hair that feels dry or thin. Your hair will most likely return to normal after your baby is born.  Your breasts will continue to grow and they will continue to become tender. A yellow fluid (colostrum) may leak from your breasts. This is the first milk you are producing for your baby.  Your belly button may stick out.  You may notice more swelling in your hands, face, or ankles.  You may have increased tingling or numbness in your hands, arms, and legs. The skin on your belly may also feel numb.  You may feel short of breath because of your expanding uterus.  You may have more problems sleeping. This can be caused by the size of your belly, increased need to urinate, and an increase in your body's metabolism.  You may notice the fetus "dropping," or moving lower in your abdomen (lightening).  You may have increased vaginal discharge.  You may notice your joints feel loose and you may have pain around your pelvic bone.  What to expect at prenatal visits You will have prenatal exams every 2 weeks until week 36. Then you will have weekly prenatal exams. During a routine prenatal  visit:  You will be weighed to make sure you and the baby are growing normally.  Your  blood pressure will be taken.  Your abdomen will be measured to track your baby's growth.  The fetal heartbeat will be listened to.  Any test results from the previous visit will be discussed.  You may have a cervical check near your due date to see if your cervix has softened or thinned (effaced).  You will be tested for Group B streptococcus. This happens between 35 and 37 weeks.  Your health care provider may ask you:  What your birth plan is.  How you are feeling.  If you are feeling the baby move.  If you have had any abnormal symptoms, such as leaking fluid, bleeding, severe headaches, or abdominal cramping.  If you are using any tobacco products, including cigarettes, chewing tobacco, and electronic cigarettes.  If you have any questions.  Other tests or screenings that may be performed during your third trimester include:  Blood tests that check for low iron levels (anemia).  Fetal testing to check the health, activity level, and growth of the fetus. Testing is done if you have certain medical conditions or if there are problems during the pregnancy.  Nonstress test (NST). This test checks the health of your baby to make sure there are no signs of problems, such as the baby not getting enough oxygen. During this test, a belt is placed around your belly. The baby is made to move, and its heart rate is monitored during movement.  What is false labor? False labor is a condition in which you feel small, irregular tightenings of the muscles in the womb (contractions) that usually go away with rest, changing position, or drinking water. These are called Braxton Hicks contractions. Contractions may last for hours, days, or even weeks before true labor sets in. If contractions come at regular intervals, become more frequent, increase in intensity, or become painful, you should see your health  care provider. What are the signs of labor?  Abdominal cramps.  Regular contractions that start at 10 minutes apart and become stronger and more frequent with time.  Contractions that start on the top of the uterus and spread down to the lower abdomen and back.  Increased pelvic pressure and dull back pain.  A watery or bloody mucus discharge that comes from the vagina.  Leaking of amniotic fluid. This is also known as your "water breaking." It could be a slow trickle or a gush. Let your health care provider know if it has a color or strange odor. If you have any of these signs, call your health care provider right away, even if it is before your due date. Follow these instructions at home: Medicines  Follow your health care provider's instructions regarding medicine use. Specific medicines may be either safe or unsafe to take during pregnancy.  Take a prenatal vitamin that contains at least 600 micrograms (mcg) of folic acid.  If you develop constipation, try taking a stool softener if your health care provider approves. Eating and drinking  Eat a balanced diet that includes fresh fruits and vegetables, whole grains, good sources of protein such as meat, eggs, or tofu, and low-fat dairy. Your health care provider will help you determine the amount of weight gain that is right for you.  Avoid raw meat and uncooked cheese. These carry germs that can cause birth defects in the baby.  If you have low calcium intake from food, talk to your health care provider about whether you should take a daily calcium supplement.  Eat four or five  small meals rather than three large meals a day.  Limit foods that are high in fat and processed sugars, such as fried and sweet foods.  To prevent constipation: ? Drink enough fluid to keep your urine clear or pale yellow. ? Eat foods that are high in fiber, such as fresh fruits and vegetables, whole grains, and beans. Activity  Exercise only as  directed by your health care provider. Most women can continue their usual exercise routine during pregnancy. Try to exercise for 30 minutes at least 5 days a week. Stop exercising if you experience uterine contractions.  Avoid heavy lifting.  Do not exercise in extreme heat or humidity, or at high altitudes.  Wear low-heel, comfortable shoes.  Practice good posture.  You may continue to have sex unless your health care provider tells you otherwise. Relieving pain and discomfort  Take frequent breaks and rest with your legs elevated if you have leg cramps or low back pain.  Take warm sitz baths to soothe any pain or discomfort caused by hemorrhoids. Use hemorrhoid cream if your health care provider approves.  Wear a good support bra to prevent discomfort from breast tenderness.  If you develop varicose veins: ? Wear support pantyhose or compression stockings as told by your healthcare provider. ? Elevate your feet for 15 minutes, 3-4 times a day. Prenatal care  Write down your questions. Take them to your prenatal visits.  Keep all your prenatal visits as told by your health care provider. This is important. Safety  Wear your seat belt at all times when driving.  Make a list of emergency phone numbers, including numbers for family, friends, the hospital, and police and fire departments. General instructions  Avoid cat litter boxes and soil used by cats. These carry germs that can cause birth defects in the baby. If you have a cat, ask someone to clean the litter box for you.  Do not travel far distances unless it is absolutely necessary and only with the approval of your health care provider.  Do not use hot tubs, steam rooms, or saunas.  Do not drink alcohol.  Do not use any products that contain nicotine or tobacco, such as cigarettes and e-cigarettes. If you need help quitting, ask your health care provider.  Do not use any medicinal herbs or unprescribed drugs. These  chemicals affect the formation and growth of the baby.  Do not douche or use tampons or scented sanitary pads.  Do not cross your legs for long periods of time.  To prepare for the arrival of your baby: ? Take prenatal classes to understand, practice, and ask questions about labor and delivery. ? Make a trial run to the hospital. ? Visit the hospital and tour the maternity area. ? Arrange for maternity or paternity leave through employers. ? Arrange for family and friends to take care of pets while you are in the hospital. ? Purchase a rear-facing car seat and make sure you know how to install it in your car. ? Pack your hospital bag. ? Prepare the baby's nursery. Make sure to remove all pillows and stuffed animals from the baby's crib to prevent suffocation.  Visit your dentist if you have not gone during your pregnancy. Use a soft toothbrush to brush your teeth and be gentle when you floss. Contact a health care provider if:  You are unsure if you are in labor or if your water has broken.  You become dizzy.  You have mild pelvic cramps, pelvic  pressure, or nagging pain in your abdominal area.  You have lower back pain.  You have persistent nausea, vomiting, or diarrhea.  You have an unusual or bad smelling vaginal discharge.  You have pain when you urinate. Get help right away if:  Your water breaks before 37 weeks.  You have regular contractions less than 5 minutes apart before 37 weeks.  You have a fever.  You are leaking fluid from your vagina.  You have spotting or bleeding from your vagina.  You have severe abdominal pain or cramping.  You have rapid weight loss or weight gain.  You have shortness of breath with chest pain.  You notice sudden or extreme swelling of your face, hands, ankles, feet, or legs.  Your baby makes fewer than 10 movements in 2 hours.  You have severe headaches that do not go away when you take medicine.  You have vision  changes. Summary  The third trimester is from week 28 through week 40, months 7 through 9. The third trimester is a time when the unborn baby (fetus) is growing rapidly.  During the third trimester, your discomfort may increase as you and your baby continue to gain weight. You may have abdominal, leg, and back pain, sleeping problems, and an increased need to urinate.  During the third trimester your breasts will keep growing and they will continue to become tender. A yellow fluid (colostrum) may leak from your breasts. This is the first milk you are producing for your baby.  False labor is a condition in which you feel small, irregular tightenings of the muscles in the womb (contractions) that eventually go away. These are called Braxton Hicks contractions. Contractions may last for hours, days, or even weeks before true labor sets in.  Signs of labor can include: abdominal cramps; regular contractions that start at 10 minutes apart and become stronger and more frequent with time; watery or bloody mucus discharge that comes from the vagina; increased pelvic pressure and dull back pain; and leaking of amniotic fluid. This information is not intended to replace advice given to you by your health care provider. Make sure you discuss any questions you have with your health care provider. Document Released: 02/22/2001 Document Revised: 08/06/2015 Document Reviewed: 05/01/2012 Elsevier Interactive Patient Education  2017 [employer]  Vaginal Delivery Vaginal delivery means that you will give birth by pushing your baby out of your birth canal (vagina). A team of health care providers will help you before, during, and after vaginal delivery. Birth experiences are unique for every woman and every pregnancy, and birth experiences vary depending on where you choose to give birth. What should I do to prepare for my baby's birth? Before your baby is born, it is important to talk with your health care  provider about:  Your labor and delivery preferences. These may include: ? Medicines that you may be given. ? How you will manage your pain. This might include non-medical pain relief techniques or injectable pain relief such as epidural analgesia. ? How you and your baby will be monitored during labor and delivery. ? Who may be in the labor and delivery room with you. ? Your feelings about surgical delivery of your baby (cesarean delivery, or C-section) if this becomes necessary. ? Your feelings about receiving donated blood through an IV tube (blood transfusion) if this becomes necessary.  Whether you are able: ? To take pictures or videos of the birth. ? To eat during labor and delivery. ? To  move around, walk, or change positions during labor and delivery.  What to expect after your baby is born, such as: ? Whether delayed umbilical cord clamping and cutting is offered. ? Who will care for your baby right after birth. ? Medicines or tests that may be recommended for your baby. ? Whether breastfeeding is supported in your hospital or birth center. ? How long you will be in the hospital or birth center.  How any medical conditions you have may affect your baby or your labor and delivery experience.  To prepare for your baby's birth, you should also:  Attend all of your health care visits before delivery (prenatal visits) as recommended by your health care provider. This is important.  Prepare your home for your baby's arrival. Make sure that you have: ? Diapers. ? Baby clothing. ? Feeding equipment. ? Safe sleeping arrangements for you and your baby.  Install a car seat in your vehicle. Have your car seat checked by a certified car seat installer to make sure that it is installed safely.  Think about who will help you with your new baby at home for at least the first several weeks after delivery.  What can I expect when I arrive at the birth center or hospital? Once you are  in labor and have been admitted into the hospital or birth center, your health care provider may:  Review your pregnancy history and any concerns you have.  Insert an IV tube into one of your veins. This is used to give you fluids and medicines.  Check your blood pressure, pulse, temperature, and heart rate (vital signs).  Check whether your bag of water (amniotic sac) has broken (ruptured).  Talk with you about your birth plan and discuss pain control options.  Monitoring Your health care provider may monitor your contractions (uterine monitoring) and your baby's heart rate (fetal monitoring). You may need to be monitored:  Often, but not continuously (intermittently).  All the time or for long periods at a time (continuously). Continuous monitoring may be needed if: ? You are taking certain medicines, such as medicine to relieve pain or make your contractions stronger. ? You have pregnancy or labor complications.  Monitoring may be done by:  Placing a special stethoscope or a handheld monitoring device on your abdomen to check your baby's heartbeat, and feeling your abdomen for contractions. This method of monitoring does not continuously record your baby's heartbeat or your contractions.  Placing monitors on your abdomen (external monitors) to record your baby's heartbeat and the frequency and length of contractions. You may not have to wear external monitors all the time.  Placing monitors inside of your uterus (internal monitors) to record your baby's heartbeat and the frequency, length, and strength of your contractions. ? Your health care provider may use internal monitors if he or she needs more information about the strength of your contractions or your baby's heart rate. ? Internal monitors are put in place by passing a thin, flexible wire through your vagina and into your uterus. Depending on the type of monitor, it may remain in your uterus or on your baby's head until  birth. ? Your health care provider will discuss the benefits and risks of internal monitoring with you and will ask for your permission before inserting the monitors.  Telemetry. This is a type of continuous monitoring that can be done with external or internal monitors. Instead of having to stay in bed, you are able to move around during  telemetry. Ask your health care provider if telemetry is an option for you.  Physical exam Your health care provider may perform a physical exam. This may include:  Checking whether your baby is positioned: ? With the head toward your vagina (head-down). This is most common. ? With the head toward the top of your uterus (head-up or breech). If your baby is in a breech position, your health care provider may try to turn your baby to a head-down position so you can deliver vaginally. If it does not seem that your baby can be born vaginally, your provider may recommend surgery to deliver your baby. In rare cases, you may be able to deliver vaginally if your baby is head-up (breech delivery). ? Lying sideways (transverse). Babies that are lying sideways cannot be delivered vaginally.  Checking your cervix to determine: ? Whether it is thinning out (effacing). ? Whether it is opening up (dilating). ? How low your baby has moved into your birth canal.  What are the three stages of labor and delivery?  Normal labor and delivery is divided into the following three stages: Stage 1  Stage 1 is the longest stage of labor, and it can last for hours or days. Stage 1 includes: ? Early labor. This is when contractions may be irregular, or regular and mild. Generally, early labor contractions are more than 10 minutes apart. ? Active labor. This is when contractions get longer, more regular, more frequent, and more intense. ? The transition phase. This is when contractions happen very close together, are very intense, and may last longer than during any other part of  labor.  Contractions generally feel mild, infrequent, and irregular at first. They get stronger, more frequent (about every 2-3 minutes), and more regular as you progress from early labor through active labor and transition.  Many women progress through stage 1 naturally, but you may need help to continue making progress. If this happens, your health care provider may talk with you about: ? Rupturing your amniotic sac if it has not ruptured yet. ? Giving you medicine to help make your contractions stronger and more frequent.  Stage 1 ends when your cervix is completely dilated to 4 inches (10 cm) and completely effaced. This happens at the end of the transition phase. Stage 2  Once your cervix is completely effaced and dilated to 4 inches (10 cm), you may start to feel an urge to push. It is common for the body to naturally take a rest before feeling the urge to push, especially if you received an epidural or certain other pain medicines. This rest period may last for up to 1-2 hours, depending on your unique labor experience.  During stage 2, contractions are generally less painful, because pushing helps relieve contraction pain. Instead of contraction pain, you may feel stretching and burning pain, especially when the widest part of your baby's head passes through the vaginal opening (crowning).  Your health care provider will closely monitor your pushing progress and your baby's progress through the vagina during stage 2.  Your health care provider may massage the area of skin between your vaginal opening and anus (perineum) or apply warm compresses to your perineum. This helps it stretch as the baby's head starts to crown, which can help prevent perineal tearing. ? In some cases, an incision may be made in your perineum (episiotomy) to allow the baby to pass through the vaginal opening. An episiotomy helps to make the opening of the vagina larger  to allow more room for the baby to fit  through.  It is very important to breathe and focus so your health care provider can control the delivery of your baby's head. Your health care provider may have you decrease the intensity of your pushing, to help prevent perineal tearing.  After delivery of your baby's head, the shoulders and the rest of the body generally deliver very quickly and without difficulty.  Once your baby is delivered, the umbilical cord may be cut right away, or this may be delayed for 1-2 minutes, depending on your baby's health. This may vary among health care providers, hospitals, and birth centers.  If you and your baby are healthy enough, your baby may be placed on your chest or abdomen to help maintain the baby's temperature and to help you bond with each other. Some mothers and babies start breastfeeding at this time. Your health care team will dry your baby and help keep your baby warm during this time.  Your baby may need immediate care if he or she: ? Showed signs of distress during labor. ? Has a medical condition. ? Was born too early (prematurely). ? Had a bowel movement before birth (meconium). ? Shows signs of difficulty transitioning from being inside the uterus to being outside of the uterus. If you are planning to breastfeed, your health care team will help you begin a feeding. Stage 3  The third stage of labor starts immediately after the birth of your baby and ends after you deliver the placenta. The placenta is an organ that develops during pregnancy to provide oxygen and nutrients to your baby in the womb.  Delivering the placenta may require some pushing, and you may have mild contractions. Breastfeeding can stimulate contractions to help you deliver the placenta.  After the placenta is delivered, your uterus should tighten (contract) and become firm. This helps to stop bleeding in your uterus. To help your uterus contract and to control bleeding, your health care provider may: ? Give you  medicine by injection, through an IV tube, by mouth, or through your rectum (rectally). ? Massage your abdomen or perform a vaginal exam to remove any blood clots that are left in your uterus. ? Empty your bladder by placing a thin, flexible tube (catheter) into your bladder. ? Encourage you to breastfeed your baby. After labor is over, you and your baby will be monitored closely to ensure that you are both healthy until you are ready to go home. Your health care team will teach you how to care for yourself and your baby. This information is not intended to replace advice given to you by your health care provider. Make sure you discuss any questions you have with your health care provider. Document Released: 12/08/2007 Document Revised: 09/18/2015 Document Reviewed: 03/15/2015 Elsevier Interactive Patient Education  2018 [employer].

## 2017-04-12 NOTE — Progress Notes (Signed)
ROB/GTT.  TDAP given in right deltoid, tolerated well. 

## 2017-04-12 NOTE — Progress Notes (Signed)
   PRENATAL VISIT NOTE  Subjective:  Tracy [name] is a 22 y.o. G1P0 at 64w0dbeing seen today for ongoing prenatal care.  She is currently monitored for the following issues for this low-risk pregnancy and has Heart murmur previously undiagnosed; Supervision of normal first pregnancy, antepartum; Rubella non-immune status, antepartum; and Maternal varicella, non-immune on their problem list.  Patient reports no complaints.  Contractions: Not present. Vag. Bleeding: None.  Movement: Present. Denies leaking of fluid.   The following portions of the patient's history were reviewed and updated as appropriate: allergies, current medications, past family history, past medical history, past social history, past surgical history and problem list. Problem list updated.  Objective:   Vitals:   04/12/17 0930  BP: 107/66  Pulse: 76  Weight: 136 lb 12.8 oz (62.1 kg)    Fetal Status: Fetal Heart Rate (bpm): 145; doppler Fundal Height: 26 cm Movement: Present     General:  Alert, oriented and cooperative. Patient is in no acute distress.  Skin: Skin is warm and dry. No rash noted.   Cardiovascular: Normal heart rate noted  Respiratory: Normal respiratory effort, no problems with respiration noted  Abdomen: Soft, gravid, appropriate for gestational age.  Pain/Pressure: Absent     Pelvic: Cervical exam deferred        Extremities: Normal range of motion.  Edema: None  Mental Status:  Normal mood and affect. Normal behavior. Normal judgment and thought content.   Assessment and Plan:  Pregnancy: G1P0 at 210w0d1. Supervision of normal first pregnancy, antepartum     Doing well.  - Glucose Tolerance, 2 Hours w/1 Hour - CBC - HIV antibody (with reflex) - RPR - Tdap vaccine greater than or equal to 7yo IM  2. Rubella non-immune status, antepartum      MMR postpartum  3. Maternal varicella, non-immune     Varicella postpartum  Preterm labor symptoms and general obstetric precautions  including but not limited to vaginal bleeding, contractions, leaking of fluid and fetal movement were reviewed in detail with the patient. Please refer to After Visit Summary for other counseling recommendations.  Return in about 2 weeks (around 04/26/2017) for ROB.   RaMorene CrockerCNM

## 2017-04-13 ENCOUNTER — Other Ambulatory Visit: Payer: Self-pay | Admitting: Certified Nurse Midwife

## 2017-04-13 DIAGNOSIS — Z3400 Encounter for supervision of normal first pregnancy, unspecified trimester: Secondary | ICD-10-CM

## 2017-04-13 LAB — CBC
Hematocrit: 34.3 % (ref 34.0–46.6)
Hemoglobin: 11.7 g/dL (ref 11.1–15.9)
MCH: 32.3 pg (ref 26.6–33.0)
MCHC: 34.1 g/dL (ref 31.5–35.7)
MCV: 95 fL (ref 79–97)
PLATELETS: 170 10*3/uL (ref 150–379)
RBC: 3.62 x10E6/uL — AB (ref 3.77–5.28)
RDW: 13.2 % (ref 12.3–15.4)
WBC: 10.7 10*3/uL (ref 3.4–10.8)

## 2017-04-13 LAB — RPR: RPR Ser Ql: NONREACTIVE

## 2017-04-13 LAB — GLUCOSE TOLERANCE, 2 HOURS W/ 1HR
GLUCOSE, 1 HOUR: 111 mg/dL (ref 65–179)
GLUCOSE, 2 HOUR: 85 mg/dL (ref 65–152)
GLUCOSE, FASTING: 70 mg/dL (ref 65–91)

## 2017-04-13 LAB — HIV ANTIBODY (ROUTINE TESTING W REFLEX): HIV SCREEN 4TH GENERATION: NONREACTIVE

## 2017-04-26 ENCOUNTER — Encounter: Payer: Self-pay | Admitting: Certified Nurse Midwife

## 2017-04-26 ENCOUNTER — Ambulatory Visit (INDEPENDENT_AMBULATORY_CARE_PROVIDER_SITE_OTHER): Payer: Medicaid Other | Admitting: Certified Nurse Midwife

## 2017-04-26 VITALS — BP 101/60 | HR 83 | Wt 142.0 lb

## 2017-04-26 DIAGNOSIS — O9989 Other specified diseases and conditions complicating pregnancy, childbirth and the puerperium: Secondary | ICD-10-CM

## 2017-04-26 DIAGNOSIS — O99613 Diseases of the digestive system complicating pregnancy, third trimester: Secondary | ICD-10-CM

## 2017-04-26 DIAGNOSIS — Z283 Underimmunization status: Secondary | ICD-10-CM

## 2017-04-26 DIAGNOSIS — O09893 Supervision of other high risk pregnancies, third trimester: Secondary | ICD-10-CM

## 2017-04-26 DIAGNOSIS — Z3400 Encounter for supervision of normal first pregnancy, unspecified trimester: Secondary | ICD-10-CM

## 2017-04-26 DIAGNOSIS — K219 Gastro-esophageal reflux disease without esophagitis: Secondary | ICD-10-CM

## 2017-04-26 DIAGNOSIS — Z3403 Encounter for supervision of normal first pregnancy, third trimester: Secondary | ICD-10-CM

## 2017-04-26 DIAGNOSIS — O09899 Supervision of other high risk pregnancies, unspecified trimester: Secondary | ICD-10-CM

## 2017-04-26 MED ORDER — OMEPRAZOLE 20 MG PO CPDR: 20.0000 mg | DELAYED_RELEASE_CAPSULE | Freq: Two times a day (BID) | ORAL | 5 refills | Status: DC

## 2017-04-26 NOTE — Progress Notes (Signed)
   PRENATAL VISIT NOTE  Subjective:  Tracy [name] is a 22 y.o. G1P0 at 47w0dbeing seen today for ongoing prenatal care.  She is currently monitored for the following issues for this low-risk pregnancy and has Heart murmur previously undiagnosed; Supervision of normal first pregnancy, antepartum; Rubella non-immune status, antepartum; and Maternal varicella, non-immune on their problem list.  Patient reports heartburn, no bleeding, no contractions, no cramping and no leaking.  Contractions: Not present. Vag. Bleeding: None.  Movement: Present. Denies leaking of fluid.   The following portions of the patient's history were reviewed and updated as appropriate: allergies, current medications, past family history, past medical history, past social history, past surgical history and problem list. Problem list updated.  Objective:   Vitals:   04/26/17 0932  BP: 101/60  Pulse: 83  Weight: 142 lb (64.4 kg)    Fetal Status: Fetal Heart Rate (bpm): 151; doppler Fundal Height: 29 cm Movement: Present     General:  Alert, oriented and cooperative. Patient is in no acute distress.  Skin: Skin is warm and dry. No rash noted.   Cardiovascular: Normal heart rate noted  Respiratory: Normal respiratory effort, no problems with respiration noted  Abdomen: Soft, gravid, appropriate for gestational age.  Pain/Pressure: Present     Pelvic: Cervical exam deferred        Extremities: Normal range of motion.  Edema: Trace  Mental Status:  Normal mood and affect. Normal behavior. Normal judgment and thought content.   Assessment and Plan:  Pregnancy: G1P0 at 216w0d1. Supervision of normal first pregnancy, antepartum     Doing well.  Reflux discussed: Rx for Prilosec sent to the pharmacy  2. Rubella non-immune status, antepartum     MMR postpartum  3. Maternal varicella, non-immune     Varicella postpartum  Preterm labor symptoms and general obstetric precautions including but not limited to  vaginal bleeding, contractions, leaking of fluid and fetal movement were reviewed in detail with the patient. Please refer to After Visit Summary for other counseling recommendations.  Return in about 2 weeks (around 05/10/2017) for ROB.   RaMorene CrockerCNM

## 2017-04-26 NOTE — Patient Instructions (Signed)
AREA PEDIATRIC/FAMILY PRACTICE PHYSICIANS  [facility] [address], [address] [place], [place]  [address] Phone - [phone]   Fax - [phone]  [facility] [address] [place], [place] [address] Phone - [phone]   Fax - [phone]  [facility] [address] [place], [place]  [address] Phone - [phone]   Fax - [phone]  [facility] [address] [place], [place]  [address] Phone - [phone]   Fax - [phone]  [facility] [address] [place], [place]  [address] Phone - [phone]   Fax - [phone]  [facility] [address] [place], [place]  [address] Phone - [phone]   Fax - [phone]  [facility] [address] [place], [place]  [address] Phone - [phone]   Fax - [phone]  [facility] [address] [place], [place]  [address] Phone - [phone]   Fax - [phone]  [facility] [address] [place], [place]  [address] Phone - [phone]   Fax - [phone] [facility] [address] [place], [place]  [address] Phone - [phone]   Fax - [phone]  [facility] [address] [place], [place]  [address] Phone - [phone]   Fax - [phone]  [facility] [address] [place], [place]  [address] Phone - [phone]   Fax - [phone]  [facility] [address] [place], [place]  [address] Phone - [phone]   Fax - [phone]  SHILPA GOSRANI [address] [place], [place]  [address] Phone - [phone]  [facility] [address] [place], [place]  [address] Phone - [phone]   Fax - [phone]  [facility] [address] [place], [place]  [address] Phone - [phone]   Fax - [phone]  [facility] [address] [place], [place]  [address] Phone - [phone]   Fax - [phone]  [facility] [address] [place], [place]  [address] Phone - [phone]   Fax - [phone]   [facility] [address] [place], [place]  [address] Phone - [phone]   Fax - [phone]  [facility] [address] [place], [place]  [address] Phone - [phone]   Fax - [phone]  DAV[facility] RUBIN [address] [place], [place]  [address] Phone - [phone]   Fax - [phone]  [facility] [address] [place], [place]  [address] Phone - [phone]   Fax - [phone]  [facility] [address] , [place]  [address] Phone - [phone]   Fax - [phone] [facility] [address] [place], [place]  [address] Phone - [phone]   Fax - [phone]  [facility] [address] [place], [place]  [address] Phone - [phone]   Fax - [phone]   [facility] [address] [place], [place]  [address] Phone - [phone]   Fax - [phone]  [facility] Charlene Flemming MD [address] [place] [address] Phone [phone]  Fax [phone]   

## 2017-05-08 ENCOUNTER — Encounter: Payer: Self-pay | Admitting: Certified Nurse Midwife

## 2017-05-10 ENCOUNTER — Encounter: Payer: Self-pay | Admitting: Certified Nurse Midwife

## 2017-05-10 ENCOUNTER — Encounter: Payer: Medicaid Other | Admitting: Certified Nurse Midwife

## 2017-05-10 ENCOUNTER — Ambulatory Visit (INDEPENDENT_AMBULATORY_CARE_PROVIDER_SITE_OTHER): Payer: Medicaid Other | Admitting: Certified Nurse Midwife

## 2017-05-10 VITALS — BP 122/72 | HR 88 | Wt 144.6 lb

## 2017-05-10 DIAGNOSIS — Z283 Underimmunization status: Secondary | ICD-10-CM

## 2017-05-10 DIAGNOSIS — Z3400 Encounter for supervision of normal first pregnancy, unspecified trimester: Secondary | ICD-10-CM

## 2017-05-10 DIAGNOSIS — O9989 Other specified diseases and conditions complicating pregnancy, childbirth and the puerperium: Secondary | ICD-10-CM

## 2017-05-10 DIAGNOSIS — O09899 Supervision of other high risk pregnancies, unspecified trimester: Secondary | ICD-10-CM

## 2017-05-10 NOTE — Progress Notes (Signed)
   PRENATAL VISIT NOTE  Subjective:  Tracy [name] is a 22 y.o. G1P0 at 17w0dbeing seen today for ongoing prenatal care.  She is currently monitored for the following issues for this low-risk pregnancy and has Heart murmur previously undiagnosed; Supervision of normal first pregnancy, antepartum; Rubella non-immune status, antepartum; and Maternal varicella, non-immune on their problem list.  Patient reports no complaints.  Contractions: Not present. Vag. Bleeding: None.  Movement: Present. Denies leaking of fluid.   The following portions of the patient's history were reviewed and updated as appropriate: allergies, current medications, past family history, past medical history, past social history, past surgical history and problem list. Problem list updated.  Objective:   Vitals:   05/10/17 0830  BP: 122/72  Pulse: 88  Weight: 144 lb 9.6 oz (65.6 kg)    Fetal Status: Fetal Heart Rate (bpm): 135; doppler Fundal Height: 31 cm Movement: Present     General:  Alert, oriented and cooperative. Patient is in no acute distress.  Skin: Skin is warm and dry. No rash noted.   Cardiovascular: Normal heart rate noted  Respiratory: Normal respiratory effort, no problems with respiration noted  Abdomen: Soft, gravid, appropriate for gestational age.  Pain/Pressure: Absent     Pelvic: Cervical exam deferred        Extremities: Normal range of motion.  Edema: Trace  Mental Status:  Normal mood and affect. Normal behavior. Normal judgment and thought content.   Assessment and Plan:  Pregnancy: G1P0 at 31w0d1. Supervision of normal first pregnancy, antepartum     Doing well  2. Rubella non-immune status, antepartum      MMR postpartum  3. Maternal varicella, non-immune     Varicella postpartum.   Preterm labor symptoms and general obstetric precautions including but not limited to vaginal bleeding, contractions, leaking of fluid and fetal movement were reviewed in detail with the  patient. Please refer to After Visit Summary for other counseling recommendations.  Return in about 2 weeks (around 05/24/2017) for [place]  RaMorene CrockerCNM

## 2017-05-10 NOTE — Patient Instructions (Addendum)
Third Trimester of Pregnancy The third trimester is from week 29 through week 42, months 7 through 9. This trimester is when your unborn baby (fetus) is growing very fast. At the end of the ninth month, the unborn baby is about 20 inches in length. It weighs about 6-10 pounds. Follow these instructions at home:  Avoid all smoking, herbs, and alcohol. Avoid drugs not approved by your doctor.  Do not use any tobacco products, including cigarettes, chewing tobacco, and electronic cigarettes. If you need help quitting, ask your doctor. You may get counseling or other support to help you quit.  Only take medicine as told by your doctor. Some medicines are safe and some are not during pregnancy.  Exercise only as told by your doctor. Stop exercising if you start having cramps.  Eat regular, healthy meals.  Wear a good support bra if your breasts are tender.  Do not use hot tubs, steam rooms, or saunas.  Wear your seat belt when driving.  Avoid raw meat, uncooked cheese, and liter boxes and soil used by cats.  Take your prenatal vitamins.  Take 1500-2000 milligrams of calcium daily starting at the 20th week of pregnancy until you deliver your baby.  Try taking medicine that helps you poop (stool softener) as needed, and if your doctor approves. Eat more fiber by eating fresh fruit, vegetables, and whole grains. Drink enough fluids to keep your pee (urine) clear or pale yellow.  Take warm water baths (sitz baths) to soothe pain or discomfort caused by hemorrhoids. Use hemorrhoid cream if your doctor approves.  If you have puffy, bulging veins (varicose veins), wear support hose. Raise (elevate) your feet for 15 minutes, 3-4 times a day. Limit salt in your diet.  Avoid heavy lifting, wear low heels, and sit up straight.  Rest with your legs raised if you have leg cramps or low back pain.  Visit your dentist if you have not gone during your pregnancy. Use a soft toothbrush to brush your  teeth. Be gentle when you floss.  You can have sex (intercourse) unless your doctor tells you not to.  Do not travel far distances unless you must. Only do so with your doctor's approval.  Take prenatal classes.  Practice driving to the hospital.  Pack your hospital bag.  Prepare the baby's room.  Go to your doctor visits. Get help if:  You are not sure if you are in labor or if your water has broken.  You are dizzy.  You have mild cramps or pressure in your lower belly (abdominal).  You have a nagging pain in your belly area.  You continue to feel sick to your stomach (nauseous), throw up (vomit), or have watery poop (diarrhea).  You have bad smelling fluid coming from your vagina.  You have pain with peeing (urination). Get help right away if:  You have a fever.  You are leaking fluid from your vagina.  You are spotting or bleeding from your vagina.  You have severe belly cramping or pain.  You lose or gain weight rapidly.  You have trouble catching your breath and have chest pain.  You notice sudden or extreme puffiness (swelling) of your face, hands, ankles, feet, or legs.  You have not felt the baby move in over an hour.  You have severe headaches that do not go away with medicine.  You have vision changes. This information is not intended to replace advice given to you by your health care provider. Make   sure you discuss any questions you have with your health care provider. Document Released: 05/25/2009 Document Revised: 08/06/2015 Document Reviewed: 05/01/2012 Elsevier Interactive Patient Education  2017 [employer].  Before Baby Comes Home Before your baby arrives it is important to:  Have all of the supplies that you will need to care for your baby.  Know where to go if there is an emergency.  Discuss the baby's arrival with other family members.  What supplies will I need?  It is recommended that you have the following supplies: Large  Items  Crib.  Crib mattress.  Rear-facing infant car seat. If possible, have a trained professional check to make sure that it is installed correctly.  Feeding  6-8 bottles that are 4-5 oz in size.  6-8 nipples.  Bottle brush.  Sterilizer, or a large pan or kettle with a lid.  A way to boil and cool water.  If you will be breastfeeding: ? Breast pump. ? Nipple cream. ? Nursing bra. ? Breast pads. ? Breast shields.  If you will be formula feeding: ? Formula. ? Measuring cups. ? Measuring spoons.  Bathing  Mild baby soap and baby shampoo.  Petroleum jelly.  Soft cloth towel and washcloth.  Hooded towel.  Cotton balls.  Bath basin.  Other Supplies  Rectal thermometer.  Bulb syringe.  Baby wipes or washcloths for diaper changes.  Diaper bag.  Changing pad.  Clothing, including one-piece outfits and pajamas.  Baby nail clippers.  Receiving blankets.  Mattress pad and sheets for the crib.  Night-light for the baby's room.  Baby monitor.  2 or 3 pacifiers.  Either 24-36 cloth diapers and waterproof diaper covers or a box of disposable diapers. You may need to use as many as 10-12 diapers per day.  How do I prepare for an emergency? Prepare for an emergency by:  Knowing how to get to the nearest hospital.  Listing the phone numbers of your baby's health care providers near your home phone and in your cell phone.  How do I prepare my family?  Decide how to handle visitors.  If you have other children: ? Talk with them about the baby coming home. Ask them how they feel about it. ? Read a book together about being a new big brother or sister. ? Find ways to let them help you prepare for the new baby. ? Have someone ready to care for them while you are in the hospital. This information is not intended to replace advice given to you by your health care provider. Make sure you discuss any questions you have with your health care  provider. Document Released: 02/11/2008 Document Revised: 08/06/2015 Document Reviewed: 02/05/2014 Elsevier Interactive Patient Education  2018 [employer].  [employer] of the uterus can occur throughout pregnancy, but they are not always a sign that you are in labor. You may have practice contractions called Braxton Hicks contractions. These false labor contractions are sometimes confused with true labor. What are Deberah Pelton contractions? Braxton Hicks contractions are tightening movements that occur in the muscles of the uterus before labor. Unlike true labor contractions, these contractions do not result in opening (dilation) and thinning of the cervix. Toward the end of pregnancy (32-34 weeks), Braxton Hicks contractions can happen more often and may become stronger. These contractions are sometimes difficult to tell apart from true labor because they can be very uncomfortable. You should not feel embarrassed if you go to the hospital with false labor. Sometimes,  the only way to tell if you are in true labor is for your health care provider to look for changes in the cervix. The health care provider will do a physical exam and may monitor your contractions. If you are not in true labor, the exam should show that your cervix is not dilating and your water has not broken. If there are other health problems associated with your pregnancy, it is completely safe for you to be sent home with false labor. You may continue to have Braxton Hicks contractions until you go into true labor. How to tell the difference between true labor and false labor True labor  Contractions last 30-70 seconds.  Contractions become very regular.  Discomfort is usually felt in the top of the uterus, and it spreads to the lower abdomen and low back.  Contractions do not go away with walking.  Contractions usually become more intense and increase in frequency.  The cervix dilates and  gets thinner. False labor  Contractions are usually shorter and not as strong as true labor contractions.  Contractions are usually irregular.  Contractions are often felt in the front of the lower abdomen and in the groin.  Contractions may go away when you walk around or change positions while lying down.  Contractions get weaker and are shorter-lasting as time goes on.  The cervix usually does not dilate or become thin. Follow these instructions at home:  Take over-the-counter and prescription medicines only as told by your health care provider.  Keep up with your usual exercises and follow other instructions from your health care provider.  Eat and drink lightly if you think you are going into labor.  If Braxton Hicks contractions are making you uncomfortable: ? Change your position from lying down or resting to walking, or change from walking to resting. ? Sit and rest in a tub of warm water. ? Drink enough fluid to keep your urine pale yellow. Dehydration may cause these contractions. ? Do slow and deep breathing several times an hour.  Keep all follow-up prenatal visits as told by your health care provider. This is important. Contact a health care provider if:  You have a fever.  You have continuous pain in your abdomen. Get help right away if:  Your contractions become stronger, more regular, and closer together.  You have fluid leaking or gushing from your vagina.  You pass blood-tinged mucus (bloody show).  You have bleeding from your vagina.  You have low back pain that you never had before.  You feel your baby's head pushing down and causing pelvic pressure.  Your baby is not moving inside you as much as it used to. Summary  Contractions that occur before labor are called Braxton Hicks contractions, false labor, or practice contractions.  Braxton Hicks contractions are usually shorter, weaker, farther apart, and less regular than true labor  contractions. True labor contractions usually become progressively stronger and regular and they become more frequent.  Manage discomfort from [facility] contractions by changing position, resting in a warm bath, drinking plenty of water, or practicing deep breathing. This information is not intended to replace advice given to you by your health care provider. Make sure you discuss any questions you have with your health care provider. Document Released: 07/14/2016 Document Revised: 07/14/2016 Document Reviewed: 07/14/2016 Elsevier Interactive Patient Education  2018 [employer]

## 2017-05-20 ENCOUNTER — Inpatient Hospital Stay (HOSPITAL_COMMUNITY)
Admission: AD | Admit: 2017-05-20 | Discharge: 2017-05-20 | Disposition: A | Discharge status: Home / Self Care | Payer: Medicaid Other | Source: Ambulatory Visit | Attending: Obstetrics & Gynecology | Admitting: Obstetrics & Gynecology

## 2017-05-20 ENCOUNTER — Encounter (HOSPITAL_COMMUNITY): Payer: Self-pay

## 2017-05-20 DIAGNOSIS — K529 Noninfective gastroenteritis and colitis, unspecified: Secondary | ICD-10-CM

## 2017-05-20 DIAGNOSIS — R109 Unspecified abdominal pain: Secondary | ICD-10-CM | POA: Diagnosis present

## 2017-05-20 DIAGNOSIS — O9989 Other specified diseases and conditions complicating pregnancy, childbirth and the puerperium: Secondary | ICD-10-CM | POA: Diagnosis not present

## 2017-05-20 DIAGNOSIS — R112 Nausea with vomiting, unspecified: Secondary | ICD-10-CM | POA: Diagnosis present

## 2017-05-20 DIAGNOSIS — Z87891 Personal history of nicotine dependence: Secondary | ICD-10-CM | POA: Insufficient documentation

## 2017-05-20 DIAGNOSIS — O26893 Other specified pregnancy related conditions, third trimester: Secondary | ICD-10-CM | POA: Insufficient documentation

## 2017-05-20 LAB — URINALYSIS, ROUTINE W REFLEX MICROSCOPIC
Bilirubin Urine: NEGATIVE
Glucose, UA: NEGATIVE mg/dL
Hgb urine dipstick: NEGATIVE
Ketones, ur: 5 mg/dL — AB
Nitrite: NEGATIVE
Protein, ur: 30 mg/dL — AB
Specific Gravity, Urine: 1.02 (ref 1.005–1.030)
pH: 7 (ref 5.0–8.0)

## 2017-05-20 MED ORDER — NIFEDIPINE 10 MG PO CAPS
10.0000 mg | ORAL_CAPSULE | Freq: Once | ORAL | Status: AC
  Administered 2017-05-20: 10 mg via ORAL
  Filled 2017-05-20: qty 1

## 2017-05-20 MED ORDER — LACTATED RINGERS IV BOLUS (SEPSIS)
1000.0000 mL | Freq: Once | INTRAVENOUS | Status: AC
  Administered 2017-05-20: 1000 mL via INTRAVENOUS

## 2017-05-20 MED ORDER — ONDANSETRON 8 MG PO TBDP: 8.0000 mg | ORAL_TABLET | Freq: Three times a day (TID) | ORAL | 0 refills | Status: DC | PRN

## 2017-05-20 MED ORDER — ONDANSETRON HCL 4 MG/2ML IJ SOLN
4.0000 mg | Freq: Once | INTRAMUSCULAR | Status: AC
  Administered 2017-05-20: 4 mg via INTRAVENOUS
  Filled 2017-05-20: qty 2

## 2017-05-20 MED ORDER — SODIUM CHLORIDE 0.9 % IV SOLN
Freq: Once | INTRAVENOUS | Status: AC
  Administered 2017-05-20: 10:00:00 via INTRAVENOUS
  Filled 2017-05-20: qty 1000

## 2017-05-20 NOTE — MAU Note (Signed)
Was feeling off yesterday. Around 2200, felt her stomach aching and a sharp pain. Started vomiting around midnight. x2 round of diarrhea, states it was just loose.

## 2017-05-20 NOTE — Discharge Instructions (Signed)

## 2017-05-20 NOTE — MAU Provider Note (Addendum)
Chief Complaint:  Emesis and Nausea   First Provider Initiated Contact with Patient 05/20/17 0740      HPI: Tracy [name] is a 22 y.o. G1P0 at 7148w3d who presents to maternity admissions reporting abdominal pain, emesis, diarrhea. She reports abdominal pain started last night around 10pm. Describes the pain as aching stabbing pain that is "all over her belly". She reports vomiting started shortly after pain around midnight and diarrhea occurred x2 around 0300 this morning. She denies pregnancy related concerns. She reports good fetal movement, denies LOF, vaginal bleeding, vaginal itching/burning, urinary symptoms, h/a, dizziness or fever/chills.    Past Medical History: Past Medical History:  Diagnosis Date  . Medical history non-contributory     Past obstetric history: OB History  Gravida Para Term Preterm AB Living  1 0          SAB TAB Ectopic Multiple Live Births               # Outcome Date GA Lbr Len/2nd Weight Sex Delivery Anes PTL Lv  1 Current               Past Surgical History: Past Surgical History:  Procedure Laterality Date  . WISDOM TOOTH EXTRACTION      Family History: Family History  Problem Relation Age of Onset  . Thyroid disease Maternal Aunt   . Hypertension Maternal Aunt   . Thyroid disease Paternal Uncle   . Thyroid disease Paternal Grandmother     Social History: Social History   Tobacco Use  . Smoking status: Former Games developermoker  . Smokeless tobacco: Never Used  . Tobacco comment: about a year ago was last use  Substance Use Topics  . Alcohol use: No    Alcohol/week: 0.0 oz  . Drug use: No    Comment: last used a year ago    Allergies: No Known Allergies  Meds:  Medications Prior to Admission  Medication Sig Dispense Refill Last Dose  . omeprazole (PRILOSEC) 20 MG capsule Take 1 capsule (20 mg total) by mouth 2 (two) times daily before a meal. 60 capsule 5 Taking  . Prenat-FeAsp-Meth-FA-DHA w/o A (PRENATE PIXIE) 10-0.6-0.4-200 MG  CAPS Take 1 tablet by mouth daily. 30 capsule 12 Taking    ROS:  Review of Systems  Constitutional: Negative.   Respiratory: Negative.   Cardiovascular: Negative.   Gastrointestinal: Positive for abdominal pain, diarrhea, nausea and vomiting. Negative for constipation.  Genitourinary: Negative.   Musculoskeletal: Negative.   Neurological: Negative.   Psychiatric/Behavioral: Negative.    I have reviewed patient's Past Medical Hx, Surgical Hx, Family Hx, Social Hx, medications and allergies.   Physical Exam   Patient Vitals for the past 24 hrs:  BP Temp Temp src Pulse Resp Height Weight  05/20/17 0736 112/64 98.3 F (36.8 C) Oral 95 18 - -  05/20/17 0725 - - - - - 5\' 3"  (1.6 m) 148 lb 4 oz (67.2 kg)   Constitutional: Well-developed, well-nourished female Cardiovascular: normal rate Respiratory: normal effort GI: Abd soft, non-tender, gravid appropriate for gestational age.  MS: Extremities nontender, no edema, normal ROM Neurologic: Alert and oriented x 4.  GU: Neg CVAT. PELVIC EXAM: deferred  FHT:  Baseline 145 , moderate variability, accelerations present, no decelerations Contractions: UI   Labs: Results for orders placed or performed during the hospital encounter of 05/20/17 (from the past 24 hour(s))  Urinalysis, Routine w reflex microscopic     Status: Abnormal   Collection Time: 05/20/17  7:22  AM  Result Value Ref Range   Color, Urine YELLOW YELLOW   APPearance CLOUDY (A) CLEAR   Specific Gravity, Urine 1.020 1.005 - 1.030   pH 7.0 5.0 - 8.0   Glucose, UA NEGATIVE NEGATIVE mg/dL   Hgb urine dipstick NEGATIVE NEGATIVE   Bilirubin Urine NEGATIVE NEGATIVE   Ketones, ur 5 (A) NEGATIVE mg/dL   Protein, ur 30 (A) NEGATIVE mg/dL   Nitrite NEGATIVE NEGATIVE   Leukocytes, UA MODERATE (A) NEGATIVE   RBC / HPF 0-5 0 - 5 RBC/hpf   WBC, UA 6-30 0 - 5 WBC/hpf   Bacteria, UA RARE (A) NONE SEEN   Squamous Epithelial / LPF 6-30 (A) NONE SEEN   Mucus PRESENT     A/Positive/-- (10/05 1110)  MAU Course/MDM: Orders Placed This Encounter  Procedures  . Urinalysis, Routine w reflex microscopic  . Contact Isolation: Enteric  . Insert peripheral IV  UA- 5 ketones present, WNL otherwise   Meds ordered this encounter  Medications  . lactated ringers bolus 1,000 mL  . ondansetron (ZOFRAN) injection 4 mg   NST reviewed Treatments in MAU included 1,022mL LR bolus with 4mg  zofran IV.    Care turned over to Luna Kitchens CNM at 0805  Steward Drone Certified Nurse-Midwife 05/20/2017 8:06 AM  Assessment/Plan:   Patient has tolerated crackers and juice and received another bolus of fluid with MVI. 10 mg of procardia for contractions and patient feels like her abdominal pain has subsided.  -Cervix is closed, long and thick.  -Ffn not done due to cervix exam.   1. Gastroenteritis    2. Patient stable for D/C with recommendation for BRAT diet, rest, hydration.  3. Reassured patient that she should feel better in 48 hours as this is most likely a virus but she should come back if she feels her contractions getting stronger or if she has any bleeding, leaking of fluid or decreased fetal movements.  4. Patient knows to return if she cannot keep down any liquids for 24 hours.  5. Will give Rx for Zofran ODT.  6. All questions answered.

## 2017-05-24 ENCOUNTER — Ambulatory Visit (INDEPENDENT_AMBULATORY_CARE_PROVIDER_SITE_OTHER): Payer: Medicaid Other | Admitting: Certified Nurse Midwife

## 2017-05-24 VITALS — BP 105/57 | HR 91 | Wt 149.7 lb

## 2017-05-24 DIAGNOSIS — O9989 Other specified diseases and conditions complicating pregnancy, childbirth and the puerperium: Secondary | ICD-10-CM

## 2017-05-24 DIAGNOSIS — O99613 Diseases of the digestive system complicating pregnancy, third trimester: Secondary | ICD-10-CM

## 2017-05-24 DIAGNOSIS — K219 Gastro-esophageal reflux disease without esophagitis: Secondary | ICD-10-CM

## 2017-05-24 DIAGNOSIS — Z3400 Encounter for supervision of normal first pregnancy, unspecified trimester: Secondary | ICD-10-CM

## 2017-05-24 DIAGNOSIS — O09899 Supervision of other high risk pregnancies, unspecified trimester: Secondary | ICD-10-CM

## 2017-05-24 DIAGNOSIS — Z283 Underimmunization status: Secondary | ICD-10-CM

## 2017-05-24 MED ORDER — OMEPRAZOLE 20 MG PO CPDR: 20.0000 mg | DELAYED_RELEASE_CAPSULE | Freq: Two times a day (BID) | ORAL | 5 refills | Status: DC

## 2017-05-24 NOTE — Progress Notes (Signed)
   PRENATAL VISIT NOTE  Subjective:  Tracy [name] is a 22 y.o. G1P0 at 63w0dbeing seen today for ongoing prenatal care.  She is currently monitored for the following issues for this low-risk pregnancy and has Heart murmur previously undiagnosed; Supervision of normal first pregnancy, antepartum; Rubella non-immune status, antepartum; and Maternal varicella, non-immune on their problem list.  Patient reports no bleeding, no contractions, no cramping, no leaking and reports one small spot when wipping several days ago after sexual intercourse, no bleeding since then, no LOF.  Reports sinus congestion with runny nose, no coughing, no fever, no chills.  OTC allergy medicine encouraged.  Contractions: Irregular. Vag. Bleeding: None.  Movement: Present. Denies leaking of fluid.   The following portions of the patient's history were reviewed and updated as appropriate: allergies, current medications, past family history, past medical history, past social history, past surgical history and problem list. Problem list updated.  Objective:   Vitals:   05/24/17 0844  BP: (!) 105/57  Pulse: 91  Weight: 149 lb 11.2 oz (67.9 kg)    Fetal Status: Fetal Heart Rate (bpm): 145; doppler  +FM noted Fundal Height: 31 cm Movement: Present     General:  Alert, oriented and cooperative. Patient is in no acute distress.  Skin: Skin is warm and dry. No rash noted.   Cardiovascular: Normal heart rate noted  Respiratory: Normal respiratory effort, no problems with respiration noted  Abdomen: Soft, gravid, appropriate for gestational age.  Pain/Pressure: Absent     Pelvic: Cervical exam deferred        Extremities: Normal range of motion.  Edema: Mild pitting, slight indentation  Mental Status:  Normal mood and affect. Normal behavior. Normal judgment and thought content.   Assessment and Plan:  Pregnancy: G1P0 at 354w0d1. Supervision of normal first pregnancy, antepartum      ?allergies.  Reports  reflux: did not pick up Prilosec. Considering waterbirth, information given.   2. Rubella non-immune status, antepartum     MMR postpartum  3. Maternal varicella, non-immune     Varicella postpartum  Preterm labor symptoms and general obstetric precautions including but not limited to vaginal bleeding, contractions, leaking of fluid and fetal movement were reviewed in detail with the patient. Please refer to After Visit Summary for other counseling recommendations.  Return in about 2 weeks (around 06/07/2017) for ROB, GBS.   RaMorene CrockerCNM

## 2017-05-24 NOTE — Progress Notes (Signed)
Had spotting after sex a few days ago. Allergies/Sinus issues.

## 2017-05-24 NOTE — Patient Instructions (Signed)
Considering Waterbirth? Guide for patients at [facility]  Why consider waterbirth?  . Gentle birth for babies . Less pain medicine used in labor . May allow for passive descent/less pushing . May reduce perineal tears  . More mobility and instinctive maternal position changes . Increased maternal relaxation . Reduced blood pressure in labor  Is waterbirth safe? What are the risks of infection, drowning or other complications?  . Infection: o Very low risk (3.7 % for tub vs 4.8% for bed) o 7 in 8000 waterbirths with documented infection o Poorly cleaned equipment most common cause o Slightly lower group B strep transmission rate  . Drowning o Maternal:  - Very low risk   - Related to seizures or fainting o Newborn:  - Very low risk. No evidence of increased risk of respiratory problems in multiple large studies - Physiological protection from breathing under water - Avoid underwater birth if there are any fetal complications - Once baby's head is out of the water, keep it out.  . Birth complication o Some reports of cord trauma, but risk decreased by bringing baby to surface gradually o No evidence of increased risk of shoulder dystocia. Mothers can usually change positions faster in water than in a bed, possibly aiding the maneuvers to free the shoulder.   You must attend a Doren Custard class at [facility]  3rd Wednesday of every month from 7-9pm  [employer] by calling [phone] or online at [email or web address]  Bring [place] the certificate from the class to your prenatal appointment  Meet with a midwife at 36 weeks to see if you can still plan a waterbirth and to sign the consent.   Purchase or rent the following supplies:   Water Birth Pool (Birth Pool in a Box or [place] for instance)  (Tubs start ~$125)  Single-use disposable tub liner designed for your brand of tub  New garden hose labeled "lead-free", "suitable for drinking  water",  Electric drain pump to remove water (We recommend 792 gallon per hour or greater pump.)   Separate garden hose to remove the dirty water  Fish net  Bathing suit top (optional)  Long-handled mirror (optional)  Places to purchase or rent supplies  [employer]Web[employer].is for tub purchases and supplies  Waterbirthsolutions.com for tub purchases and supplies  [employer] Ladies (www.thelaborladies.com) $275 for tub rental/set-up & take down/kit   [employer] (http://www.fleming.com/.htm) Information regarding doulas (labor support) who provide pool rentals  Our practice has a Birth Pool in a Box tub at the hospital that you may borrow on a first-come-first-served basis. It is your responsibility to to set up, clean and break down the tub. We cannot guarantee the availability of this tub in advance. You are responsible for bringing all accessories listed above. If you do not have all necessary supplies you cannot have a waterbirth.    Things that would prevent you from having a waterbirth:  Premature, <37wks  Previous cesarean birth  Presence of thick meconium-stained fluid  Multiple gestation (Twins, triplets, etc.)  Uncontrolled diabetes or gestational diabetes requiring medication  Hypertension requiring medication or diagnosis of pre-eclampsia  Heavy vaginal bleeding  Non-reassuring fetal heart rate  Active infection (MRSA, etc.). Group B Strep is NOT a contraindication for  waterbirth.  If your labor has to be induced and induction method requires continuous  monitoring of the baby's heart rate  Other risks/issues identified by your obstetrical provider  Please remember that birth is unpredictable. Under certain unforeseeable  circumstances your provider may advise against giving birth in the tub. These decisions will be made on a case-by-case basis and with the safety of you and your baby as our highest priority.

## 2017-06-07 ENCOUNTER — Encounter: Payer: Self-pay | Admitting: Certified Nurse Midwife

## 2017-06-07 ENCOUNTER — Other Ambulatory Visit (HOSPITAL_COMMUNITY)
Admission: RE | Admit: 2017-06-07 | Discharge: 2017-06-07 | Disposition: A | Discharge status: Home / Self Care | Payer: Medicaid Other | Source: Ambulatory Visit | Attending: Certified Nurse Midwife | Admitting: Certified Nurse Midwife

## 2017-06-07 ENCOUNTER — Ambulatory Visit (INDEPENDENT_AMBULATORY_CARE_PROVIDER_SITE_OTHER): Payer: Medicaid Other | Admitting: Certified Nurse Midwife

## 2017-06-07 VITALS — BP 116/71 | HR 92 | Wt 149.6 lb

## 2017-06-07 DIAGNOSIS — Z3400 Encounter for supervision of normal first pregnancy, unspecified trimester: Secondary | ICD-10-CM

## 2017-06-07 DIAGNOSIS — Z283 Underimmunization status: Secondary | ICD-10-CM

## 2017-06-07 DIAGNOSIS — O09899 Supervision of other high risk pregnancies, unspecified trimester: Secondary | ICD-10-CM

## 2017-06-07 DIAGNOSIS — O9989 Other specified diseases and conditions complicating pregnancy, childbirth and the puerperium: Secondary | ICD-10-CM

## 2017-06-07 NOTE — Progress Notes (Signed)
   PRENATAL VISIT NOTE  Subjective:  Tracy [name] is a 22 y.o. G1P0 at 16w0dbeing seen today for ongoing prenatal care.  She is currently monitored for the following issues for this low-risk pregnancy and has Heart murmur previously undiagnosed; Supervision of normal first pregnancy, antepartum; Rubella non-immune status, antepartum; and Maternal varicella, non-immune on their problem list.  Patient reports no complaints.  Contractions: Not present. Vag. Bleeding: None.  Movement: Present. Denies leaking of fluid.   The following portions of the patient's history were reviewed and updated as appropriate: allergies, current medications, past family history, past medical history, past social history, past surgical history and problem list. Problem list updated.  Objective:   Vitals:   06/07/17 1110  BP: 116/71  Pulse: 92  Weight: 149 lb 9.6 oz (67.9 kg)    Fetal Status: Fetal Heart Rate (bpm): 146; doppler Fundal Height: 35 cm Movement: Present  Presentation: Vertex  General:  Alert, oriented and cooperative. Patient is in no acute distress.  Skin: Skin is warm and dry. No rash noted.   Cardiovascular: Normal heart rate noted  Respiratory: Normal respiratory effort, no problems with respiration noted  Abdomen: Soft, gravid, appropriate for gestational age.  Pain/Pressure: Absent     Pelvic: Cervical exam performed Dilation: 1 Effacement (%): 50 Station: -3  Extremities: Normal range of motion.  Edema: None  Mental Status:  Normal mood and affect. Normal behavior. Normal judgment and thought content.   Assessment and Plan:  Pregnancy: G1P0 at 353w0d1. Supervision of normal first pregnancy, antepartum     Doing well - Strep Gp B NAA - Cervicovaginal ancillary only  2. Rubella non-immune status, antepartum     MMR postpartum  3. Maternal varicella, non-immune     Varicella postpartum  Preterm labor symptoms and general obstetric precautions including but not limited to  vaginal bleeding, contractions, leaking of fluid and fetal movement were reviewed in detail with the patient. Please refer to After Visit Summary for other counseling recommendations.  Return in about 1 week (around 06/14/2017) for RODin[place]  RaMorene CrockerCNM

## 2017-06-07 NOTE — Progress Notes (Signed)
Pt has concerned about due date per pt was changed to  07/07/17.

## 2017-06-09 LAB — CERVICOVAGINAL ANCILLARY ONLY
Bacterial vaginitis: NEGATIVE
Candida vaginitis: NEGATIVE
Chlamydia: NEGATIVE
NEISSERIA GONORRHEA: NEGATIVE
TRICH (WINDOWPATH): NEGATIVE

## 2017-06-09 LAB — STREP GP B NAA: Strep Gp B NAA: NEGATIVE

## 2017-06-14 ENCOUNTER — Other Ambulatory Visit: Payer: Self-pay | Admitting: Certified Nurse Midwife

## 2017-06-14 ENCOUNTER — Ambulatory Visit (INDEPENDENT_AMBULATORY_CARE_PROVIDER_SITE_OTHER): Payer: Medicaid Other | Admitting: Certified Nurse Midwife

## 2017-06-14 VITALS — BP 116/69 | HR 83 | Wt 153.2 lb

## 2017-06-14 DIAGNOSIS — O99613 Diseases of the digestive system complicating pregnancy, third trimester: Secondary | ICD-10-CM

## 2017-06-14 DIAGNOSIS — K219 Gastro-esophageal reflux disease without esophagitis: Secondary | ICD-10-CM

## 2017-06-14 DIAGNOSIS — O09899 Supervision of other high risk pregnancies, unspecified trimester: Secondary | ICD-10-CM

## 2017-06-14 DIAGNOSIS — Z3400 Encounter for supervision of normal first pregnancy, unspecified trimester: Secondary | ICD-10-CM

## 2017-06-14 DIAGNOSIS — Z283 Underimmunization status: Secondary | ICD-10-CM

## 2017-06-14 DIAGNOSIS — O9989 Other specified diseases and conditions complicating pregnancy, childbirth and the puerperium: Secondary | ICD-10-CM

## 2017-06-14 DIAGNOSIS — Z2839 Other underimmunization status: Secondary | ICD-10-CM

## 2017-06-14 MED ORDER — OMEPRAZOLE 20 MG PO CPDR: 20.0000 mg | DELAYED_RELEASE_CAPSULE | Freq: Two times a day (BID) | ORAL | 5 refills | Status: DC

## 2017-06-14 NOTE — Progress Notes (Signed)
   PRENATAL VISIT NOTE  Subjective:  Tracy [name] is a 22 y.o. G1P0 at 38w0dbeing seen today for ongoing prenatal care.  She is currently monitored for the following issues for this low-risk pregnancy and has Heart murmur previously undiagnosed; Supervision of normal first pregnancy, antepartum; Rubella non-immune status, antepartum; and Maternal varicella, non-immune on their problem list.  Patient reports heartburn, no bleeding, no contractions, no cramping and no leaking.  Contractions: Not present. Vag. Bleeding: None.  Movement: Present. Denies leaking of fluid.   The following portions of the patient's history were reviewed and updated as appropriate: allergies, current medications, past family history, past medical history, past social history, past surgical history and problem list. Problem list updated.  Objective:   Vitals:   06/14/17 1106  BP: 116/69  Pulse: 83  Weight: 153 lb 3.2 oz (69.5 kg)    Fetal Status:     Movement: Present     General:  Alert, oriented and cooperative. Patient is in no acute distress.  Skin: Skin is warm and dry. No rash noted.   Cardiovascular: Normal heart rate noted  Respiratory: Normal respiratory effort, no problems with respiration noted  Abdomen: Soft, gravid, appropriate for gestational age.  Pain/Pressure: Absent     Pelvic: Cervical exam deferred        Extremities: Normal range of motion.  Edema: None  Mental Status: Normal mood and affect. Normal behavior. Normal judgment and thought content.   Assessment and Plan:  Pregnancy: G1P0 at 361w0d1. Supervision of normal first pregnancy, antepartum     Doing well.  Employment varification letter completed.  - QuantiFERON-TB Gold Plus  2. Rubella non-immune status, antepartum     MMR postpartum  3. Maternal varicella, non-immune     [facility]  4. Gastroesophageal reflux during pregnancy, antepartum, third trimester     OTC Tums.  -  omeprazole (PRILOSEC) 20 MG capsule; Take 1 capsule (20 mg total) by mouth 2 (two) times daily before a meal.  Dispense: 60 capsule; Refill: 5  Preterm labor symptoms and general obstetric precautions including but not limited to vaginal bleeding, contractions, leaking of fluid and fetal movement were reviewed in detail with the patient. Please refer to After Visit Summary for other counseling recommendations.  Return in about 1 week (around 06/21/2017) for ROSidney No future appointments.  RaMorene CrockerCNM

## 2017-06-14 NOTE — Progress Notes (Signed)
Patient reports good fetal movement, complains of heartburn and nausea every night. Pt states that she did not pick up rx for prilosec.

## 2017-06-15 ENCOUNTER — Encounter: Payer: Self-pay | Admitting: Certified Nurse Midwife

## 2017-06-15 ENCOUNTER — Other Ambulatory Visit: Payer: Self-pay | Admitting: Certified Nurse Midwife

## 2017-06-15 DIAGNOSIS — O219 Vomiting of pregnancy, unspecified: Secondary | ICD-10-CM

## 2017-06-15 DIAGNOSIS — K219 Gastro-esophageal reflux disease without esophagitis: Secondary | ICD-10-CM

## 2017-06-15 DIAGNOSIS — O99613 Diseases of the digestive system complicating pregnancy, third trimester: Principal | ICD-10-CM

## 2017-06-15 MED ORDER — PROMETHAZINE HCL 12.5 MG PO TABS: 12.5000 mg | ORAL_TABLET | Freq: Four times a day (QID) | ORAL | 0 refills | Status: DC | PRN

## 2017-06-15 MED ORDER — DOXYLAMINE-PYRIDOXINE 10-10 MG PO TBEC: DELAYED_RELEASE_TABLET | ORAL | 4 refills | Status: DC

## 2017-06-15 MED ORDER — PANTOPRAZOLE SODIUM 40 MG PO TBEC: 40.0000 mg | DELAYED_RELEASE_TABLET | Freq: Every day | ORAL | 2 refills | Status: DC

## 2017-06-18 LAB — QUANTIFERON-TB GOLD PLUS
QUANTIFERON MITOGEN VALUE: 4.09 [IU]/mL
QUANTIFERON TB1 AG VALUE: 0.03 [IU]/mL
QUANTIFERON-TB GOLD PLUS: NEGATIVE
QuantiFERON Nil Value: 0.02 IU/mL
QuantiFERON TB2 Ag Value: 0.02 IU/mL

## 2017-06-21 ENCOUNTER — Ambulatory Visit (INDEPENDENT_AMBULATORY_CARE_PROVIDER_SITE_OTHER): Payer: Medicaid Other | Admitting: Certified Nurse Midwife

## 2017-06-21 ENCOUNTER — Encounter: Payer: Self-pay | Admitting: Certified Nurse Midwife

## 2017-06-21 VITALS — BP 123/70 | HR 78 | Wt 151.0 lb

## 2017-06-21 DIAGNOSIS — Z3400 Encounter for supervision of normal first pregnancy, unspecified trimester: Secondary | ICD-10-CM

## 2017-06-21 DIAGNOSIS — Z283 Underimmunization status: Secondary | ICD-10-CM

## 2017-06-21 DIAGNOSIS — O9989 Other specified diseases and conditions complicating pregnancy, childbirth and the puerperium: Secondary | ICD-10-CM

## 2017-06-21 DIAGNOSIS — O09899 Supervision of other high risk pregnancies, unspecified trimester: Secondary | ICD-10-CM

## 2017-06-21 NOTE — Progress Notes (Signed)
   PRENATAL VISIT NOTE  Subjective:  Tracy [name] is a 22 y.o. G1P0 at 14w0dbeing seen today for ongoing prenatal care.  She is currently monitored for the following issues for this low-risk pregnancy and has Heart murmur previously undiagnosed; Supervision of normal first pregnancy, antepartum; Rubella non-immune status, antepartum; and Maternal varicella, non-immune on their problem list.  Patient reports no complaints.  Contractions: Irregular. Vag. Bleeding: None.  Movement: Present. Denies leaking of fluid.   The following portions of the patient's history were reviewed and updated as appropriate: allergies, current medications, past family history, past medical history, past social history, past surgical history and problem list. Problem list updated.  Objective:   Vitals:   06/21/17 1114  BP: 123/70  Pulse: 78  Weight: 151 lb (68.5 kg)    Fetal Status: Fetal Heart Rate (bpm): 138; doppler Fundal Height: 36 cm Movement: Present     General:  Alert, oriented and cooperative. Patient is in no acute distress.  Skin: Skin is warm and dry. No rash noted.   Cardiovascular: Normal heart rate noted  Respiratory: Normal respiratory effort, no problems with respiration noted  Abdomen: Soft, gravid, appropriate for gestational age.  Pain/Pressure: Present     Pelvic: Cervical exam deferred        Extremities: Normal range of motion.     Mental Status: Normal mood and affect. Normal behavior. Normal judgment and thought content.   Assessment and Plan:  Pregnancy: G1P0 at 382w0d1. Supervision of normal first pregnancy, antepartum     Doing well.   2. Rubella non-immune status, antepartum     MMR postpartum  3. Maternal varicella, non-immune     Varicella postpartum  Term labor symptoms and general obstetric precautions including but not limited to vaginal bleeding, contractions, leaking of fluid and fetal movement were reviewed in detail with the patient. Please refer to  After Visit Summary for other counseling recommendations.  Return in about 1 week (around 06/28/2017) for ROWest Branch Future Appointments  Date Time Provider [place]4/18/2019 11:15 AM DeMorene CrockerCNM [facility]    RaMorene CrockerCNM

## 2017-06-29 ENCOUNTER — Encounter: Payer: Medicaid Other | Admitting: Certified Nurse Midwife

## 2017-06-29 ENCOUNTER — Ambulatory Visit (INDEPENDENT_AMBULATORY_CARE_PROVIDER_SITE_OTHER): Payer: Medicaid Other | Admitting: Certified Nurse Midwife

## 2017-06-29 ENCOUNTER — Encounter: Payer: Self-pay | Admitting: Certified Nurse Midwife

## 2017-06-29 VITALS — BP 117/73 | HR 84 | Wt 155.0 lb

## 2017-06-29 DIAGNOSIS — Z283 Underimmunization status: Secondary | ICD-10-CM

## 2017-06-29 DIAGNOSIS — Z3400 Encounter for supervision of normal first pregnancy, unspecified trimester: Secondary | ICD-10-CM

## 2017-06-29 DIAGNOSIS — Z3403 Encounter for supervision of normal first pregnancy, third trimester: Secondary | ICD-10-CM

## 2017-06-29 DIAGNOSIS — O9989 Other specified diseases and conditions complicating pregnancy, childbirth and the puerperium: Secondary | ICD-10-CM

## 2017-06-29 DIAGNOSIS — O09899 Supervision of other high risk pregnancies, unspecified trimester: Secondary | ICD-10-CM

## 2017-06-29 DIAGNOSIS — Z2839 Other underimmunization status: Secondary | ICD-10-CM

## 2017-06-29 DIAGNOSIS — O09893 Supervision of other high risk pregnancies, third trimester: Secondary | ICD-10-CM

## 2017-06-29 NOTE — Progress Notes (Signed)
   PRENATAL VISIT NOTE  Subjective:  Tracy [name] is a 22 y.o. G1P0 at 4w1dbeing seen today for ongoing prenatal care.  She is currently monitored for the following issues for this low-risk pregnancy and has Heart murmur previously undiagnosed; Supervision of normal first pregnancy, antepartum; Rubella non-immune status, antepartum; and Maternal varicella, non-immune on their problem list.  Patient reports no bleeding, no leaking and occasional contractions.  Contractions: Irregular. Vag. Bleeding: None.  Movement: Present. Denies leaking of fluid.   The following portions of the patient's history were reviewed and updated as appropriate: allergies, current medications, past family history, past medical history, past social history, past surgical history and problem list. Problem list updated.  Objective:   Vitals:   06/29/17 1514  BP: 117/73  Pulse: 84  Weight: 155 lb (70.3 kg)    Fetal Status: Fetal Heart Rate (bpm): 158; doppler Fundal Height: 36 cm Movement: Present  Presentation: Vertex  General:  Alert, oriented and cooperative. Patient is in no acute distress.  Skin: Skin is warm and dry. No rash noted.   Cardiovascular: Normal heart rate noted  Respiratory: Normal respiratory effort, no problems with respiration noted  Abdomen: Soft, gravid, appropriate for gestational age.  Pain/Pressure: Present     Pelvic: Cervical exam performed Dilation: 2 Effacement (%): 50 Station: -3  Extremities: Normal range of motion.  Edema: Mild pitting, slight indentation  Mental Status: Normal mood and affect. Normal behavior. Normal judgment and thought content.   Assessment and Plan:  Pregnancy: G1P0 at 339w1d1. Supervision of normal first pregnancy, antepartum     Doing well  2. Rubella non-immune status, antepartum     MMR postpartum  3. Maternal varicella, non-immune     Varicella postpartum  Term labor symptoms and general obstetric precautions including but not limited  to vaginal bleeding, contractions, leaking of fluid and fetal movement were reviewed in detail with the patient. Please refer to After Visit Summary for other counseling recommendations.  Return in about 1 week (around 07/06/2017) for ROB.  No future appointments.  RaMorene CrockerCNM

## 2017-06-29 NOTE — Progress Notes (Signed)
Pt c/o: pain and pressure and BH contractions and swelling in feet   Pt request cervix check today.

## 2017-07-01 ENCOUNTER — Inpatient Hospital Stay (HOSPITAL_COMMUNITY): Payer: Medicaid Other | Admitting: Anesthesiology

## 2017-07-01 ENCOUNTER — Inpatient Hospital Stay (HOSPITAL_COMMUNITY)
Admission: AD | Admit: 2017-07-01 | Discharge: 2017-07-04 | DRG: 805 | Disposition: A | Discharge status: Home / Self Care | Payer: Medicaid Other | Source: Ambulatory Visit | Attending: Family Medicine | Admitting: Family Medicine

## 2017-07-01 ENCOUNTER — Other Ambulatory Visit: Payer: Self-pay

## 2017-07-01 ENCOUNTER — Encounter (HOSPITAL_COMMUNITY): Payer: Self-pay

## 2017-07-01 DIAGNOSIS — O4292 Full-term premature rupture of membranes, unspecified as to length of time between rupture and onset of labor: Principal | ICD-10-CM | POA: Diagnosis present

## 2017-07-01 DIAGNOSIS — O411230 Chorioamnionitis, third trimester, not applicable or unspecified: Secondary | ICD-10-CM | POA: Diagnosis present

## 2017-07-01 DIAGNOSIS — Z3A38 38 weeks gestation of pregnancy: Secondary | ICD-10-CM

## 2017-07-01 DIAGNOSIS — Z87891 Personal history of nicotine dependence: Secondary | ICD-10-CM

## 2017-07-01 DIAGNOSIS — Z3400 Encounter for supervision of normal first pregnancy, unspecified trimester: Secondary | ICD-10-CM

## 2017-07-01 LAB — TYPE AND SCREEN
ABO/RH(D): A POS
Antibody Screen: NEGATIVE

## 2017-07-01 LAB — CBC
HCT: 31.4 % — ABNORMAL LOW (ref 36.0–46.0)
Hemoglobin: 10.5 g/dL — ABNORMAL LOW (ref 12.0–15.0)
MCH: 31.4 pg (ref 26.0–34.0)
MCHC: 33.4 g/dL (ref 30.0–36.0)
MCV: 94 fL (ref 78.0–100.0)
PLATELETS: 154 10*3/uL (ref 150–400)
RBC: 3.34 MIL/uL — ABNORMAL LOW (ref 3.87–5.11)
RDW: 13.1 % (ref 11.5–15.5)
WBC: 13.9 10*3/uL — AB (ref 4.0–10.5)

## 2017-07-01 LAB — POCT FERN TEST: POCT FERN TEST: POSITIVE

## 2017-07-01 MED ORDER — LIDOCAINE HCL (PF) 1 % IJ SOLN
30.0000 mL | INTRAMUSCULAR | Status: DC | PRN
  Filled 2017-07-01: qty 30

## 2017-07-01 MED ORDER — OXYTOCIN 40 UNITS IN LACTATED RINGERS INFUSION - SIMPLE MED
1.0000 m[IU]/min | INTRAVENOUS | Status: DC
  Administered 2017-07-01: 2 m[IU]/min via INTRAVENOUS

## 2017-07-01 MED ORDER — ONDANSETRON HCL 4 MG/2ML IJ SOLN: 4.0000 mg | Freq: Four times a day (QID) | INTRAMUSCULAR | Status: DC | PRN

## 2017-07-01 MED ORDER — LACTATED RINGERS IV SOLN
500.0000 mL | INTRAVENOUS | Status: DC | PRN
  Administered 2017-07-01 – 2017-07-02 (×2): 500 mL via INTRAVENOUS

## 2017-07-01 MED ORDER — OXYCODONE-ACETAMINOPHEN 5-325 MG PO TABS: 2.0000 | ORAL_TABLET | ORAL | Status: DC | PRN

## 2017-07-01 MED ORDER — OXYTOCIN BOLUS FROM INFUSION
500.0000 mL | Freq: Once | INTRAVENOUS | Status: AC
  Administered 2017-07-02: 500 mL via INTRAVENOUS

## 2017-07-01 MED ORDER — DIPHENHYDRAMINE HCL 50 MG/ML IJ SOLN: 12.5000 mg | INTRAMUSCULAR | Status: DC | PRN

## 2017-07-01 MED ORDER — OXYCODONE-ACETAMINOPHEN 5-325 MG PO TABS: 1.0000 | ORAL_TABLET | ORAL | Status: DC | PRN

## 2017-07-01 MED ORDER — FENTANYL CITRATE (PF) 100 MCG/2ML IJ SOLN
100.0000 ug | INTRAMUSCULAR | Status: DC | PRN
  Administered 2017-07-01 (×3): 100 ug via INTRAVENOUS
  Filled 2017-07-01 (×3): qty 2

## 2017-07-01 MED ORDER — LACTATED RINGERS IV SOLN: 500.0000 mL | Freq: Once | INTRAVENOUS | Status: DC

## 2017-07-01 MED ORDER — LIDOCAINE HCL (PF) 1 % IJ SOLN
INTRAMUSCULAR | Status: DC | PRN
  Administered 2017-07-01: 4 mL
  Administered 2017-07-01: 6 mL

## 2017-07-01 MED ORDER — ACETAMINOPHEN 325 MG PO TABS
650.0000 mg | ORAL_TABLET | ORAL | Status: DC | PRN
  Administered 2017-07-01 – 2017-07-02 (×2): 650 mg via ORAL
  Filled 2017-07-01 (×2): qty 2

## 2017-07-01 MED ORDER — SOD CITRATE-CITRIC ACID 500-334 MG/5ML PO SOLN
30.0000 mL | ORAL | Status: DC | PRN
  Administered 2017-07-01 (×2): 30 mL via ORAL
  Filled 2017-07-01 (×2): qty 15

## 2017-07-01 MED ORDER — EPHEDRINE 5 MG/ML INJ
10.0000 mg | INTRAVENOUS | Status: DC | PRN
  Filled 2017-07-01: qty 2

## 2017-07-01 MED ORDER — FLEET ENEMA 7-19 GM/118ML RE ENEM: 1.0000 | ENEMA | RECTAL | Status: DC | PRN

## 2017-07-01 MED ORDER — TERBUTALINE SULFATE 1 MG/ML IJ SOLN
0.2500 mg | Freq: Once | INTRAMUSCULAR | Status: DC | PRN
  Filled 2017-07-01: qty 1

## 2017-07-01 MED ORDER — PHENYLEPHRINE 40 MCG/ML (10ML) SYRINGE FOR IV PUSH (FOR BLOOD PRESSURE SUPPORT)
80.0000 ug | PREFILLED_SYRINGE | INTRAVENOUS | Status: DC | PRN
  Filled 2017-07-01: qty 10
  Filled 2017-07-01: qty 5

## 2017-07-01 MED ORDER — PHENYLEPHRINE 40 MCG/ML (10ML) SYRINGE FOR IV PUSH (FOR BLOOD PRESSURE SUPPORT)
80.0000 ug | PREFILLED_SYRINGE | INTRAVENOUS | Status: DC | PRN
  Filled 2017-07-01: qty 5

## 2017-07-01 MED ORDER — FENTANYL 2.5 MCG/ML BUPIVACAINE 1/10 % EPIDURAL INFUSION (WH - ANES)
14.0000 mL/h | INTRAMUSCULAR | Status: DC | PRN
  Administered 2017-07-01 – 2017-07-02 (×3): 14 mL/h via EPIDURAL
  Filled 2017-07-01 (×3): qty 100

## 2017-07-01 MED ORDER — LACTATED RINGERS IV SOLN
INTRAVENOUS | Status: DC
  Administered 2017-07-01 (×2): via INTRAVENOUS

## 2017-07-01 MED ORDER — OXYTOCIN 40 UNITS IN LACTATED RINGERS INFUSION - SIMPLE MED
2.5000 [IU]/h | INTRAVENOUS | Status: DC
  Filled 2017-07-01: qty 1000

## 2017-07-01 NOTE — Anesthesia Preprocedure Evaluation (Signed)
Anesthesia Evaluation  Patient identified by MRN, date of birth, ID band Patient awake    Reviewed: Allergy & Precautions, H&P , Patient's Chart, lab work & pertinent test results  Airway Mallampati: II  TM Distance: >3 FB Neck ROM: full    Dental no notable dental hx.    Pulmonary former smoker,    Pulmonary exam normal breath sounds clear to auscultation       Cardiovascular Exercise Tolerance: Good  Rhythm:regular Rate:Normal     Neuro/Psych    GI/Hepatic   Endo/Other    Renal/GU      Musculoskeletal   Abdominal   Peds  Hematology   Anesthesia Other Findings   Reproductive/Obstetrics                             Anesthesia Physical Anesthesia Plan  ASA: II  Anesthesia Plan: Epidural   Post-op Pain Management:    Induction:   PONV Risk Score and Plan:   Airway Management Planned:   Additional Equipment:   Intra-op Plan:   Post-operative Plan:   Informed Consent: I have reviewed the patients History and Physical, chart, labs and discussed the procedure including the risks, benefits and alternatives for the proposed anesthesia with the patient or authorized representative who has indicated his/her understanding and acceptance.   Dental Advisory Given  Plan Discussed with:   Anesthesia Plan Comments: (Labs checked- platelets confirmed with RN in room. Fetal heart tracing, per RN, reported to be stable enough for sitting procedure. Discussed epidural, and patient consents to the procedure:  included risk of possible headache,backache, failed block, allergic reaction, and nerve injury. This patient was asked if she had any questions or concerns before the procedure started.)        Anesthesia Quick Evaluation  

## 2017-07-01 NOTE — H&P (Signed)
Tracy [name] is a 22 y.o. G1P0 female at 4269w3d by LMP c/w 5wk u/s presenting with SROM clear fluid at 0630.   Reports active fetal movement, contractions: regular, vaginal bleeding: none, membranes: ruptured, clear fluid. [facility]    This pregnancy complicated by:   Prenatal History/Complications:  Rubella & varicella non-imm  Past Medical History: Past Medical History:  Diagnosis Date  . Medical history non-contributory     Past Surgical History: Past Surgical History:  Procedure Laterality Date  . WISDOM TOOTH EXTRACTION      Obstetrical History: OB History    Gravida  1   Para  0   Term      Preterm      AB      Living        SAB      TAB      Ectopic      Multiple      Live Births              Social History: Social History   Socioeconomic History  . Marital status: Single    Spouse name: Not on file  . Number of children: Not on file  . Years of education: Not on file  . Highest education level: Not on file  Occupational History  . Not on file  Social Needs  . Financial resource strain: Not on file  . Food insecurity:    Worry: Not on file    Inability: Not on file  . Transportation needs:    Medical: Not on file    Non-medical: Not on file  Tobacco Use  . Smoking status: Former Games developermoker  . Smokeless tobacco: Never Used  . Tobacco comment: about a year ago was last use  Substance and Sexual Activity  . Alcohol use: No    Alcohol/week: 0.0 oz  . Drug use: No    Types: Marijuana    Comment: last used a year ago  . Sexual activity: Yes    Birth control/protection: None  Lifestyle  . Physical activity:    Days per week: Not on file    Minutes per session: Not on file  . Stress: Not on file  Relationships  . Social connections:    Talks on phone: Not on file    Gets together: Not on file    Attends religious service: Not on file    Active member of club or organization: Not on file   Attends meetings of clubs or organizations: Not on file    Relationship status: Not on file  Other Topics Concern  . Not on file  Social History Narrative  . Not on file    Family History: Family History  Problem Relation Age of Onset  . Thyroid disease Maternal Aunt   . Hypertension Maternal Aunt   . Thyroid disease Paternal Uncle   . Thyroid disease Paternal Grandmother     Allergies: No Known Allergies  Medications Prior to Admission  Medication Sig Dispense Refill Last Dose  . omeprazole (PRILOSEC) 20 MG capsule Take 1 capsule (20 mg total) by mouth 2 (two) times daily before a meal. 60 capsule 5 06/30/2017 at Unknown time  . ondansetron (ZOFRAN-ODT) 8 MG disintegrating tablet Take 1 tablet (8 mg total) by mouth every 8 (eight) hours as needed for nausea or vomiting. 20 tablet 0 06/30/2017 at Unknown time  . Doxylamine-Pyridoxine (DICLEGIS) 10-10 MG TBEC Take 1 tablet with breakfast and  lunch.  Take 2 tablets at bedtime. (Patient not taking: Reported on 07/01/2017) 100 tablet 4 Not Taking at Unknown time  . pantoprazole (PROTONIX) 40 MG tablet Take 1 tablet (40 mg total) by mouth daily. (Patient not taking: Reported on 07/01/2017) 30 tablet 2 Not Taking at Unknown time  . promethazine (PHENERGAN) 12.5 MG tablet Take 1 tablet (12.5 mg total) by mouth every 6 (six) hours as needed for nausea or vomiting. (Patient not taking: Reported on 06/29/2017) 30 tablet 0 Not Taking at Unknown time    Review of Systems  Pertinent pos/neg as indicated in HPI  Blood pressure (!) 142/75, pulse 97, temperature 97.6 F (36.4 C), temperature source Oral, resp. rate 18, height 5\' 3"  (1.6 m), weight 68.5 kg (151 lb), last menstrual period 10/05/2016. General appearance: alert, cooperative and no distress Lungs: clear to auscultation bilaterally Heart: regular rate and rhythm Abdomen: gravid, soft, non-tender Extremities: tr edema DTR's 2+  Fetal monitoring: FHR: 135 bpm, variability: moderate,   Accelerations: Present,  decelerations:  Absent Uterine activity: q 2-7mins Dilation: 3 Effacement (%): 90 Station: -2 Exam by:: n druebbisch rn Presentation: cephalic   Prenatal labs: ABO, Rh: --/--/A POS (04/20 4098) Antibody: NEG (04/20 0949) Rubella: 0.98 (10/05 1110) RPR: Non Reactive (01/30 1137)  HBsAg: Negative (10/05 1110)  HIV: Non Reactive (01/30 1137)  GBS: Negative (03/27 1347)   2hr GTT: 70/111/85 Genetic screening:  AFP neg, Mat21 neg Anatomy US: normal female  Results for orders placed or performed during the hospital encounter of 07/01/17 (from the past 24 hour(s))  POCT fern test   Collection Time: 07/01/17  9:21 AM  Result Value Ref Range   POCT Fern Test Positive = ruptured amniotic membanes   CBC   Collection Time: 07/01/17  9:49 AM  Result Value Ref Range   WBC 13.9 (H) 4.0 - 10.5 K/uL   RBC 3.34 (L) 3.87 - 5.11 MIL/uL   Hemoglobin 10.5 (L) 12.0 - 15.0 g/dL   HCT 11.9 (L) 14.7 - 82.9 %   MCV 94.0 78.0 - 100.0 fL   MCH 31.4 26.0 - 34.0 pg   MCHC 33.4 30.0 - 36.0 g/dL   RDW 56.2 13.0 - 86.5 %   Platelets 154 150 - 400 K/uL  Type and screen [facility]   Collection Time: 07/01/17  9:49 AM  Result Value Ref Range   ABO/RH(D) A POS    Antibody Screen NEG    Sample Expiration      07/04/2017 Performed at [facility], [address], [place], [place] [address]      Assessment:  [redacted]w[redacted]d SIUP  G1P0  SROM, early labor  Cat 1 FHR  GBS Negative (03/27 1347)  Plan:  Admit to BS  IV pain meds/epidural prn active labor  Expectant management  Anticipate NSVB   Plans to breast & bottlefeed  Contraception: depo  Circumcision: n/a  [name] CNM, WHNP-BC 07/01/2017, 11:39 AM

## 2017-07-01 NOTE — Progress Notes (Signed)
Patient ID: Tracy [name], female   DOB: 03/04/1996, 22 y.o.   MRN: [id number] Tracy [name] is a 22 y.o. G1P0 at 5060w3d admitted for active labor, rupture of membranes  Subjective: Comfortable w/ epidural  Objective: BP (!) 108/58   Pulse 91   Temp 98.5 F (36.9 C) (Oral)   Resp 16   Ht 5\' 3"  (1.6 m)   Wt 68.5 kg (151 lb)   LMP 10/05/2016 (Exact Date)   BMI 26.75 kg/m  No intake/output data recorded.  FHT:  FHR: 140 bpm, variability: moderate,  accelerations:  Present,  decelerations:  Absent UC:   regular, every 2-4 minutes  SVE:   Dilation: 8 Effacement (%): 100 Station: -1 Exam by:: SRussell, RN   Labs: Lab Results  Component Value Date   WBC 13.9 (H) 07/01/2017   HGB 10.5 (L) 07/01/2017   HCT 31.4 (L) 07/01/2017   MCV 94.0 07/01/2017   PLT 154 07/01/2017    Assessment / Plan: SROM/SOL, no cervical change since 1620, will begin pitocin per protocol  Labor: slowed Fetal Wellbeing:  Category I Pain Control:  Epidural Pre-eclampsia: n/a I/D:  n/a Anticipated MOD:  NSVD  Cheral MarkerKimberly R Booker CNM, WHNP-BC 07/01/2017, 7:02 PM

## 2017-07-01 NOTE — Progress Notes (Signed)
Patient ID: Tracy [name], female   DOB: 07/20/1995, 22 y.o.   MRN: [id number] Tracy [name] is a 22 y.o. G1P0 at 2915w3d admitted for active labor, rupture of membranes  Subjective: Comfortable w/ epidural  Objective: BP 95/60   Pulse 81   Temp 98.5 F (36.9 C) (Oral)   Resp 16   Ht 5\' 3"  (1.6 m)   Wt 68.5 kg (151 lb)   LMP 10/05/2016 (Exact Date)   BMI 26.75 kg/m  Total I/O In: -  Out: 525 [Urine:525]  FHT:  FHR: 135 bpm, variability: moderate,  accelerations:  Present,  decelerations:  Absent UC:   regular, every 1-4 minutes  SVE:   Dilation: 8 Effacement (%): 100 Station: -1 Exam by:: SRussell, RN   Labs: Lab Results  Component Value Date   WBC 13.9 (H) 07/01/2017   HGB 10.5 (L) 07/01/2017   HCT 31.4 (L) 07/01/2017   MCV 94.0 07/01/2017   PLT 154 07/01/2017    Assessment / Plan: Spontaneous labor, progressing normally  Labor: Progressing normally Fetal Wellbeing:  Category I Pain Control:  Epidural Pre-eclampsia: n/a I/D:  n/a Anticipated MOD:  NSVD  Cheral MarkerKimberly R Aloysious Vangieson CNM, WHNP-BC 07/01/2017, 6:06 PM

## 2017-07-01 NOTE — Progress Notes (Signed)
Tracy [name] is a 22 y.o. G1P0 at 6823w3d admitted for rupture of membranes  Subjective: Doing well, has received 3 doses IV Fentanyl  Objective: BP 133/69   Pulse 71   Temp 98.2 F (36.8 C) (Oral)   Resp 16   Ht 5\' 3"  (1.6 m)   Wt 68.5 kg (151 lb)   LMP 10/05/2016 (Exact Date)   BMI 26.75 kg/m  No intake/output data recorded.  FHT:  FHR: 135 bpm, variability: moderate,  accelerations:  Present,  decelerations:  Absent UC:   regular, every 3-4 minutes  SVE:   Dilation: 6 Effacement (%): 90 Station: -1 Exam by:: OmnicareSRussell, RN   Labs: Lab Results  Component Value Date   WBC 13.9 (H) 07/01/2017   HGB 10.5 (L) 07/01/2017   HCT 31.4 (L) 07/01/2017   MCV 94.0 07/01/2017   PLT 154 07/01/2017    Assessment / Plan: Spontaneous labor, progressing normally  Labor: Progressing normally Fetal Wellbeing:  Category I Pain Control:  IV pain meds Pre-eclampsia: n/a I/D:  n/a Anticipated MOD:  NSVD  Cheral MarkerKimberly R Paizleigh Wilds CNM, WHNP-BC 07/01/2017, 12:50 PM

## 2017-07-01 NOTE — MAU Note (Signed)
Ctx since 9pm last night, LOF around 0630, clear fluid and gooey. +FM, no bleeding

## 2017-07-01 NOTE — Progress Notes (Signed)
Patient ID: Tracy [name], female   DOB: 09/05/1995, 22 y.o.   MRN: [id number] Tracy [name] is a 22 y.o. G1P0 at 3838w3d admitted for active labor, rupture of membranes  Subjective: Comfortable w/ epidural  Objective: BP 112/68   Pulse 85   Temp 98.3 F (36.8 C) (Oral)   Resp 16   Ht 5\' 3"  (1.6 m)   Wt 68.5 kg (151 lb)   LMP 10/05/2016 (Exact Date)   BMI 26.75 kg/m  No intake/output data recorded.  FHT:  FHR: 130 bpm, variability: moderate,  accelerations:  Present,  decelerations:  Absent UC:   regular, every 2-4 minutes  SVE:   Dilation: 7 Effacement (%): 100 Station: -1 Exam by:: SRussell, RN   Labs: Lab Results  Component Value Date   WBC 13.9 (H) 07/01/2017   HGB 10.5 (L) 07/01/2017   HCT 31.4 (L) 07/01/2017   MCV 94.0 07/01/2017   PLT 154 07/01/2017    Assessment / Plan: Spontaneous labor, progressing normally  Labor: Progressing normally Fetal Wellbeing:  Category I Pain Control:  Epidural Pre-eclampsia: n/a I/D:  n/a Anticipated MOD:  NSVD  Cheral MarkerKimberly R Tallulah Hosman CNM, WHNP-BC 07/01/2017, 4:10 PM

## 2017-07-01 NOTE — Progress Notes (Signed)
LABOR PROGRESS NOTE  Tracy [name] is a 22 y.o. G1P0 at 8010w3d  admitted for active labor, rupture of membranes   Subjective: Patient comfortable with epidural   Objective: BP 114/68   Pulse 100   Temp (!) 100.5 F (38.1 [name]) (Oral)   Resp 20   Ht 5\' 3"  (1.6 m)   Wt 151 lb (68.5 kg)   LMP 10/05/2016 (Exact Date)   BMI 26.75 kg/m  or  Vitals:   07/01/17 2100 07/01/17 2130 07/01/17 2200 07/01/17 2230  BP: 109/72 113/66 (!) 116/58 114/68  Pulse: 96 100 (!) 107 100  Resp: 20 20 20 20   Temp:   (!) 100.5 F (38.1 [name])   TempSrc:   Oral   Weight:      Height:        Dilation: 8.5 Effacement (%): 100 Cervical Position: Anterior Station: 0 Presentation: Vertex Exam by:: Tracy [name] FHT: baseline rate 150, moderate varibility, +acel, no decel Toco: 2-3   Labs: Lab Results  Component Value Date   WBC 13.9 (H) 07/01/2017   HGB 10.5 (L) 07/01/2017   HCT 31.4 (L) 07/01/2017   MCV 94.0 07/01/2017   PLT 154 07/01/2017    Patient Active Problem List   Diagnosis Date Noted  . Normal labor and delivery 07/01/2017  . Rubella non-immune status, antepartum 12/20/2016  . Maternal varicella, non-immune 12/20/2016  . Supervision of normal first pregnancy, antepartum 12/16/2016  . Heart murmur previously undiagnosed 10/07/2014    Assessment / Plan: 22 y.o. G1P0 at 1410w3d here for active labor, SROM   Labor: Slow to progress, continue to titrate pitocin  Fetal Wellbeing:  Cat I Pain Control:  Epidural  Anticipated MOD:  SVD   Sharyon CableRogers, Tracy [name], CNM 07/01/2017, 10:38 PM

## 2017-07-01 NOTE — Anesthesia Pain Management Evaluation Note (Signed)
  CRNA Pain Management Visit Note  Patient: Tracy BrunsJehnya L [name], 22 y.o., female  "Hello I am a [facility] the anesthesia [facility]. We have an anesthesia team available at all times to provide care throughout the hospital, including epidural management and anesthesia for C-section. I don't know your plan for the delivery whether it a natural birth, water birth, IV sedation, nitrous supplementation, doula or epidural, but we want to meet your pain goals."   1.Was your pain managed to your expectations on prior hospitalizations?   No prior hospitalizations  2.What is your expectation for pain management during this hospitalization?     Epidural  3.How can we help you reach that goal? unsure  Record the patient's initial score and the patient's pain goal.   Pain: 8  Pain Goal: 8 [facility] wants you to be able to say your pain was always managed very well.  Cephus ShellingBURGER,Tracy [name] 07/01/2017

## 2017-07-01 NOTE — Anesthesia Procedure Notes (Signed)
Epidural Patient location during procedure: OB  Staffing Anesthesiologist: Tyaire Odem, MD  Preanesthetic Checklist Completed: patient identified, pre-op evaluation, timeout performed, IV checked, risks and benefits discussed and monitors and equipment checked  Epidural Patient position: sitting Prep: DuraPrep Patient monitoring: blood pressure and continuous pulse ox Approach: right paramedian Location: L3-L4 Injection technique: LOR air  Needle:  Needle type: Tuohy  Needle gauge: 17 G Needle insertion depth: 5 cm Catheter type: closed end flexible Catheter size: 19 Gauge Catheter at skin depth: 10 cm Test dose: negative  Assessment Sensory level: T8  Additional Notes   Dosing of Epidural:  1st dose, through catheter .............................................  Xylocaine 40 mg  2nd dose, through catheter, after waiting 3 minutes.........Xylocaine 60 mg    As each dose occurred, patient was free of IV sx; and patient exhibited no evidence of SA injection.  Patient is more comfortable after epidural dosed. Please see RN's note for documentation of vital signs,and FHR which are stable.  Patient reminded not to try to ambulate with numb legs, and that an RN must be present when she attempts to get up.          

## 2017-07-02 ENCOUNTER — Encounter (HOSPITAL_COMMUNITY): Payer: Self-pay

## 2017-07-02 DIAGNOSIS — Z3A38 38 weeks gestation of pregnancy: Secondary | ICD-10-CM

## 2017-07-02 LAB — RPR: RPR Ser Ql: NONREACTIVE

## 2017-07-02 MED ORDER — WITCH HAZEL-GLYCERIN EX PADS: 1.0000 "application " | MEDICATED_PAD | CUTANEOUS | Status: DC | PRN

## 2017-07-02 MED ORDER — MEASLES, MUMPS & RUBELLA VAC ~~LOC~~ INJ
0.5000 mL | INJECTION | Freq: Once | SUBCUTANEOUS | Status: AC
  Administered 2017-07-04: 0.5 mL via SUBCUTANEOUS
  Filled 2017-07-02 (×2): qty 0.5

## 2017-07-02 MED ORDER — IBUPROFEN 600 MG PO TABS
600.0000 mg | ORAL_TABLET | Freq: Four times a day (QID) | ORAL | Status: DC
  Administered 2017-07-02 – 2017-07-04 (×9): 600 mg via ORAL
  Filled 2017-07-02 (×9): qty 1

## 2017-07-02 MED ORDER — TETANUS-DIPHTH-ACELL PERTUSSIS 5-2.5-18.5 LF-MCG/0.5 IM SUSP: 0.5000 mL | Freq: Once | INTRAMUSCULAR | Status: DC

## 2017-07-02 MED ORDER — SIMETHICONE 80 MG PO CHEW
80.0000 mg | CHEWABLE_TABLET | ORAL | Status: DC | PRN
Start: 2017-07-02 — End: 2017-07-04

## 2017-07-02 MED ORDER — BENZOCAINE-MENTHOL 20-0.5 % EX AERO
1.0000 "application " | INHALATION_SPRAY | CUTANEOUS | Status: DC | PRN
  Filled 2017-07-02: qty 56

## 2017-07-02 MED ORDER — GENTAMICIN SULFATE 40 MG/ML IJ SOLN
160.0000 mg | Freq: Three times a day (TID) | INTRAMUSCULAR | Status: DC
  Administered 2017-07-02: 160 mg via INTRAVENOUS
  Filled 2017-07-02 (×2): qty 4

## 2017-07-02 MED ORDER — COCONUT OIL OIL: 1.0000 "application " | TOPICAL_OIL | Status: DC | PRN

## 2017-07-02 MED ORDER — SENNOSIDES-DOCUSATE SODIUM 8.6-50 MG PO TABS
2.0000 | ORAL_TABLET | ORAL | Status: DC
  Administered 2017-07-02 – 2017-07-03 (×2): 2 via ORAL
  Filled 2017-07-02 (×2): qty 2

## 2017-07-02 MED ORDER — DIPHENHYDRAMINE HCL 25 MG PO CAPS: 25.0000 mg | ORAL_CAPSULE | Freq: Four times a day (QID) | ORAL | Status: DC | PRN

## 2017-07-02 MED ORDER — ONDANSETRON HCL 4 MG/2ML IJ SOLN: 4.0000 mg | INTRAMUSCULAR | Status: DC | PRN

## 2017-07-02 MED ORDER — PRENATAL MULTIVITAMIN CH
1.0000 | ORAL_TABLET | Freq: Every day | ORAL | Status: DC
  Administered 2017-07-02 – 2017-07-04 (×3): 1 via ORAL
  Filled 2017-07-02 (×3): qty 1

## 2017-07-02 MED ORDER — DIBUCAINE 1 % RE OINT: 1.0000 "application " | TOPICAL_OINTMENT | RECTAL | Status: DC | PRN

## 2017-07-02 MED ORDER — ACETAMINOPHEN 325 MG PO TABS
650.0000 mg | ORAL_TABLET | ORAL | Status: DC | PRN
  Administered 2017-07-03 – 2017-07-04 (×2): 650 mg via ORAL
  Filled 2017-07-02 (×2): qty 2

## 2017-07-02 MED ORDER — SODIUM CHLORIDE 0.9 % IV SOLN
1.0000 g | Freq: Once | INTRAVENOUS | Status: AC
  Administered 2017-07-02: 1 g via INTRAVENOUS
  Filled 2017-07-02: qty 1000

## 2017-07-02 MED ORDER — ZOLPIDEM TARTRATE 5 MG PO TABS: 5.0000 mg | ORAL_TABLET | Freq: Every evening | ORAL | Status: DC | PRN

## 2017-07-02 MED ORDER — ONDANSETRON HCL 4 MG PO TABS: 4.0000 mg | ORAL_TABLET | ORAL | Status: DC | PRN

## 2017-07-02 NOTE — Anesthesia Postprocedure Evaluation (Signed)
Anesthesia Post Note  Patient: Tracy BrunsJehnya L [name]  Procedure(s) Performed: AN AD HOC LABOR EPIDURAL     Patient location during evaluation: Mother Baby Anesthesia Type: Epidural Level of consciousness: awake Pain management: satisfactory to patient Vital Signs Assessment: post-procedure vital signs reviewed and stable Respiratory status: spontaneous breathing Cardiovascular status: stable Anesthetic complications: no    Last Vitals:  Vitals:   07/02/17 0925 07/02/17 1158  BP: (!) 106/51 (!) 96/55  Pulse: 79 66  Resp: 16 16  Temp: 36.9 C 36.8 C  SpO2: 98% 100%    Last Pain:  Vitals:   07/02/17 1200  TempSrc:   PainSc: 0-No pain   Pain Goal:                 KeyCorpBURGER,Senetra Dillin

## 2017-07-02 NOTE — Lactation Note (Signed)
This note was copied from a baby's chart. Lactation Consultation Note  Patient Name: Tracy Flo ShanksJehnya [name] WUJWJ'XToday's Date: 07/02/2017   Baby 10 hours old. Room full of visitors. [facility]. Mother wants to pump and bottle feed.  She is also formula feeding. Mother states she pumped and received no volume. Provided education about how milk comes to volume. Encouraged mother to pump q 3 hours. Mom made aware of O/P services, breastfeeding support groups, community resources, and our phone # for post-discharge questions.        Maternal Data    Feeding    LATCH Score                   Interventions    Lactation Tools Discussed/Used     Consult Status      Hardie PulleyBerkelhammer, Tracy [name] 07/02/2017, 5:13 PM

## 2017-07-02 NOTE — Progress Notes (Signed)
Pharmacy Antibiotic Note  Tracy [name] is a 22 y.o. female G1P0 at 5745w3d admitted on 07/01/2017 for active labor and ROM. Pt now has an elevated temperature. Pharmacy has been consulted for gentamicin dosing for chorioamnionitis.   Plan: Gentamicin 160 mg IV every 8 hours Gentamicin therapeutic goal levels: Peaks 6-8 mcg/ml.;troughs <1 mcg/ml Monitor serum creatinine per protocol Serum gentamicin levels as indicated  Height: 5\' 3"  (160 cm) Weight: 151 lb (68.5 kg) IBW/kg (Calculated) : 52.4  Temp (24hrs), Avg:99 F (37.2 C), Min:97.6 F (36.4 C), Max:100.5 F (38.1 C)  Recent Labs  Lab 07/01/17 0949  WBC 13.9*    CrCl cannot be calculated (Patient's most recent lab result is older than the maximum 21 days allowed.).    No Known Allergies  Antimicrobials this admission: Ampicillin 07/02/17 x one dose  Dose adjustments this admission: N/A  Microbiology results:  Thank you for allowing pharmacy to be a part of this patient's care.  Tracy [name], Tracy [name] Anne 07/02/2017 5:55 AM

## 2017-07-03 NOTE — Progress Notes (Signed)
Post Partum Day #1 Subjective: no complaints, up ad lib and tolerating PO; bottlefeeding; plans Depo for pp contraception; Triple I in labor- has been afebrile PP  Objective: Blood pressure 116/61, pulse 66, temperature 99.4 F (37.4 C), temperature source Oral, resp. rate 18, height 5\' 3"  (1.6 m), weight 68.5 kg (151 lb), last menstrual period 10/05/2016, SpO2 100 %, unknown if currently breastfeeding.  Physical Exam:  General: alert, cooperative and no distress Lochia: appropriate Uterine Fundus: firm DVT Evaluation: No evidence of DVT seen on physical exam.  Recent Labs    07/01/17 0949  HGB 10.5*  HCT 31.4*    Assessment/Plan: Plan for discharge tomorrow   LOS: 2 days   Cam HaiSHAW, Tracy [name] CNM 07/03/2017, 9:24 AM

## 2017-07-04 MED ORDER — IBUPROFEN 600 MG PO TABS: 600.0000 mg | ORAL_TABLET | Freq: Four times a day (QID) | ORAL | 0 refills | Status: DC

## 2017-07-04 NOTE — Discharge Summary (Addendum)
OB Discharge Summary     Patient Name: Tracy BrunsJehnya L Haynie DOB: 03/17/1995 MRN: [id number]  Date of admission: 07/01/2017 Delivering MD: Sharyon CableOGERS, VERONICA C   Date of discharge: 07/04/2017  Admitting diagnosis: 38 wks labor Intrauterine pregnancy: 6319w4d     Secondary diagnosis:  Active Problems:   SVD (spontaneous vaginal delivery)  Additional problems: Shoulder Dystocia                                      Triple I infection     Discharge diagnosis: Term Pregnancy Delivered                                                                         Shoulder Dystocia                                         Triple I infection in labor  Post partum procedures:N/A  Augmentation: Pitocin  Complications: Intrauterine Inflammation or infection (Chorioamniotis), treated with amp and gent. Resolved after delivery and was afebrile rest of hospitalization.  Hospital course:  Onset of Labor With Vaginal Delivery     22 y.o. yo G1P1001 at 3119w4d was admitted in Active Labor on 07/01/2017. Patient had an uncomplicated labor course as follows:  Membrane Rupture Time/Date: 6:30 AM ,07/01/2017   Intrapartum Procedures: Episiotomy:                                           Lacerations:  1st degree [2];Perineal [11]  Patient had a delivery of a Viable infant. 07/02/2017  Information for the patient's newborn:  Mahlon GammonMendenhall, Girl Joliana [[id number]][[id number]]  Delivery Method: Vaginal, Spontaneous(Filed from Delivery Summary)   Patient had triple I intrapartum. Treated with amp and gent x1. Resolved and was afebrile after delivery. Pateint had an uncomplicated postpartum course.  She is ambulating, tolerating a regular diet, passing flatus, and urinating well. Patient is discharged home in stable condition on 07/04/17.   Physical exam  Vitals:   07/02/17 2025 07/03/17 0545 07/03/17 1843 07/04/17 0525  BP: 106/60 116/61 117/68 104/62  Pulse: 66 66 63 (!) 51  Resp: 16 18 18 16   Temp: 98.7 F (37.1 C) 99.4 F  (37.4 C) 98.4 F (36.9 C) 98.2 F (36.8 C)  TempSrc: Axillary Oral  Oral  SpO2: 100% 100% 99% 100%  Weight:      Height:       General: alert, cooperative and no distress Lochia: appropriate Uterine Fundus: firm Incision: N/A DVT Evaluation: No evidence of DVT seen on physical exam. Negative Homan's sign. Labs: Lab Results  Component Value Date   WBC 13.9 (H) 07/01/2017   HGB 10.5 (L) 07/01/2017   HCT 31.4 (L) 07/01/2017   MCV 94.0 07/01/2017   PLT 154 07/01/2017   CMP Latest Ref Rng & Units 04/22/2016  Glucose 65 - 99 mg/dL 81  BUN 6 - 20 mg/dL 10  Creatinine 6.290.44 - 5.281.00 mg/dL  0.81  Sodium 135 - 145 mmol/L 141  Potassium 3.5 - 5.1 mmol/L 3.9  Chloride 101 - 111 mmol/L 106  CO2 22 - 32 mmol/L 26  Calcium 8.9 - 10.3 mg/dL 9.9  Total Protein 6.5 - 8.1 g/dL 7.6  Total Bilirubin 0.3 - 1.2 mg/dL 0.4  Alkaline Phos 38 - 126 U/L 41  AST 15 - 41 U/L 18  ALT 14 - 54 U/L 16    Discharge instruction: per After Visit Summary and "Baby and Me Booklet".  After visit meds:  Allergies as of 07/04/2017   No Known Allergies     Medication List    STOP taking these medications   Doxylamine-Pyridoxine 10-10 MG Tbec Commonly known as:  DICLEGIS   omeprazole 20 MG capsule Commonly known as:  PRILOSEC   ondansetron 8 MG disintegrating tablet Commonly known as:  ZOFRAN-ODT   pantoprazole 40 MG tablet Commonly known as:  PROTONIX   promethazine 12.5 MG tablet Commonly known as:  PHENERGAN     TAKE these medications   ibuprofen 600 MG tablet Commonly known as:  ADVIL,MOTRIN Take 1 tablet (600 mg total) by mouth every 6 (six) hours.       Diet: routine diet  Activity: Advance as tolerated. Pelvic rest for 6 weeks.   Outpatient follow up: 4 weeks Follow up Appt:No future appointments. Follow up Visit:No follow-ups on file.  Postpartum contraception: Depo Provera  Newborn Data: Live born female  Birth Weight: 7 lb 5.5 oz (3331 g) APGAR: 8, 9  Newborn Delivery    Birth date/time:  07/02/2017 06:19:00 Delivery type:  Vaginal, Spontaneous     Baby Feeding: Bottle Disposition:home with mother   07/04/2017 Myrene Buddy, MD  I confirm that I have verified the information documented in the resident's note and that I have also personally reperformed the physical exam and all medical decision making activities. Patient was seen and examined by me also Agree with note Vitals stable Labs stable Fundus firm, lochia within normal limits Perineum healing Ext WNL Continue care  Ready for discharge  Aviva Signs, CNM

## 2017-07-04 NOTE — Discharge Instructions (Signed)
Vaginal Delivery, Care After °Refer to this sheet in the next few weeks. These instructions provide you with information about caring for yourself after vaginal delivery. Your health care provider may also give you more specific instructions. Your treatment has been planned according to current medical practices, but problems sometimes occur. Call your health care provider if you have any problems or questions. °What can I expect after the procedure? °After vaginal delivery, it is common to have: °· Some bleeding from your vagina. °· Soreness in your abdomen, your vagina, and the area of skin between your vaginal opening and your anus (perineum). °· Pelvic cramps. °· Fatigue. ° °Follow these instructions at home: °Medicines °· Take over-the-counter and prescription medicines only as told by your health care provider. °· If you were prescribed an antibiotic medicine, take it as told by your health care provider. Do not stop taking the antibiotic until it is finished. °Driving ° °· Do not drive or operate heavy machinery while taking prescription pain medicine. °· Do not drive for 24 hours if you received a sedative. °Lifestyle °· Do not drink alcohol. This is especially important if you are breastfeeding or taking medicine to relieve pain. °· Do not use tobacco products, including cigarettes, chewing tobacco, or e-cigarettes. If you need help quitting, ask your health care provider. °Eating and drinking °· Drink at least 8 eight-ounce glasses of water every day unless you are told not to by your health care provider. If you choose to breastfeed your baby, you may need to drink more water than this. °· Eat high-fiber foods every day. These foods may help prevent or relieve constipation. High-fiber foods include: °? Whole grain cereals and breads. °? Brown rice. °? Beans. °? Fresh fruits and vegetables. °Activity °· Return to your normal activities as told by your health care provider. Ask your health care provider  what activities are safe for you. °· Rest as much as possible. Try to rest or take a nap when your baby is sleeping. °· Do not lift anything that is heavier than your baby or 10 lb (4.5 kg) until your health care provider says that it is safe. °· Talk with your health care provider about when you can engage in sexual activity. This may depend on your: °? Risk of infection. °? Rate of healing. °? Comfort and desire to engage in sexual activity. °Vaginal Care °· If you have an episiotomy or a vaginal tear, check the area every day for signs of infection. Check for: °? More redness, swelling, or pain. °? More fluid or blood. °? Warmth. °? Pus or a bad smell. °· Do not use tampons or douches until your health care provider says this is safe. °· Watch for any blood clots that may pass from your vagina. These may look like clumps of dark red, brown, or black discharge. °General instructions °· Keep your perineum clean and dry as told by your health care provider. °· Wear loose, comfortable clothing. °· Wipe from front to back when you use the toilet. °· Ask your health care provider if you can shower or take a bath. If you had an episiotomy or a perineal tear during labor and delivery, your health care provider may tell you not to take baths for a certain length of time. °· Wear a bra that supports your breasts and fits you well. °· If possible, have someone help you with household activities and help care for your baby for at least a few days after   you leave the hospital. °· Keep all follow-up visits for you and your baby as told by your health care provider. This is important. °Contact a health care provider if: °· You have: °? Vaginal discharge that has a bad smell. °? Difficulty urinating. °? Pain when urinating. °? A sudden increase or decrease in the frequency of your bowel movements. °? More redness, swelling, or pain around your episiotomy or vaginal tear. °? More fluid or blood coming from your episiotomy or  vaginal tear. °? Pus or a bad smell coming from your episiotomy or vaginal tear. °? A fever. °? A rash. °? Little or no interest in activities you used to enjoy. °? Questions about caring for yourself or your baby. °· Your episiotomy or vaginal tear feels warm to the touch. °· Your episiotomy or vaginal tear is separating or does not appear to be healing. °· Your breasts are painful, hard, or turn red. °· You feel unusually sad or worried. °· You feel nauseous or you vomit. °· You pass large blood clots from your vagina. If you pass a blood clot from your vagina, save it to show to your health care provider. Do not flush blood clots down the toilet without having your health care provider look at them. °· You urinate more than usual. °· You are dizzy or light-headed. °· You have not breastfed at all and you have not had a menstrual period for 12 weeks after delivery. °· You have stopped breastfeeding and you have not had a menstrual period for 12 weeks after you stopped breastfeeding. °Get help right away if: °· You have: °? Pain that does not go away or does not get better with medicine. °? Chest pain. °? Difficulty breathing. °? Blurred vision or spots in your vision. °? Thoughts about hurting yourself or your baby. °· You develop pain in your abdomen or in one of your legs. °· You develop a severe headache. °· You faint. °· You bleed from your vagina so much that you fill two sanitary pads in one hour. °This information is not intended to replace advice given to you by your health care provider. Make sure you discuss any questions you have with your health care provider. °Document Released: 02/26/2000 Document Revised: 08/12/2015 Document Reviewed: 03/15/2015 °Elsevier Interactive Patient Education © 2018 [employer] ° °

## 2017-07-06 ENCOUNTER — Encounter: Payer: Medicaid Other | Admitting: Certified Nurse Midwife

## 2017-07-14 ENCOUNTER — Other Ambulatory Visit: Payer: Self-pay | Admitting: Obstetrics

## 2017-08-02 ENCOUNTER — Ambulatory Visit: Payer: Medicaid Other | Admitting: Certified Nurse Midwife

## 2017-08-10 ENCOUNTER — Ambulatory Visit (INDEPENDENT_AMBULATORY_CARE_PROVIDER_SITE_OTHER): Payer: Medicaid Other | Admitting: Certified Nurse Midwife

## 2017-08-10 ENCOUNTER — Encounter: Payer: Self-pay | Admitting: Certified Nurse Midwife

## 2017-08-10 VITALS — BP 122/72 | HR 70 | Ht 63.0 in | Wt 127.9 lb

## 2017-08-10 DIAGNOSIS — Z3042 Encounter for surveillance of injectable contraceptive: Secondary | ICD-10-CM

## 2017-08-10 DIAGNOSIS — Z1389 Encounter for screening for other disorder: Secondary | ICD-10-CM | POA: Diagnosis not present

## 2017-08-10 DIAGNOSIS — Z30013 Encounter for initial prescription of injectable contraceptive: Secondary | ICD-10-CM

## 2017-08-10 MED ORDER — MEDROXYPROGESTERONE ACETATE 150 MG/ML IM SUSP
150.0000 mg | Freq: Once | INTRAMUSCULAR | Status: AC
  Administered 2017-08-10: 150 mg via INTRAMUSCULAR

## 2017-08-10 MED ORDER — MEDROXYPROGESTERONE ACETATE 150 MG/ML IM SUSP
150.0000 mg | INTRAMUSCULAR | 4 refills | Status: DC
Start: 2017-10-08 — End: 2020-04-16

## 2017-08-10 NOTE — Progress Notes (Signed)
Post Partum Exam  Tracy [name] is a 22 y.o. G37P1001 female who presents for a postpartum visit. She is 5 weeks postpartum following a spontaneous vaginal delivery. I have fully reviewed the prenatal and intrapartum course. The delivery was at [redacted]w[redacted]d  gestational weeks.  Anesthesia: epidural. Postpartum course has been unremarkable. Baby's course has been unremarkable. Baby is feeding by bottle - gerber . Bleeding staining only. Bowel function is normal. Bladder function is normal. Patient is sexually active. Contraception method is Depo-Provera injections. Postpartum depression screening:neg.  States that she started her period yesterday.    The following portions of the patient's history were reviewed and updated as appropriate: allergies, current medications, past family history, past medical history, past social history, past surgical history and problem list. Last pap smear done 12/16/2016 and was Normal  Review of Systems Pertinent items noted in HPI and remainder of comprehensive ROS otherwise negative.    Objective:  Blood pressure 122/72, pulse 70, height  (1.6 m), weight 127 lb 14.4 oz (58 kg), last menstrual period 10/05/2016, unknown if currently breastfeeding.  General:  alert, cooperative and no distress   Breasts:  inspection negative, no nipple discharge or bleeding, no masses or nodularity palpable  Lungs: clear to auscultation bilaterally  Heart:  regular rate and rhythm, S1, S2 normal, no murmur, click, rub or gallop  Abdomen: soft, non-tender; bowel sounds normal; no masses,  no organomegaly  Pelvic/Rectal Exam: Not performed.        Assessment:    Normal 5 week postpartum exam. Pap smear not done at today's visit.   Plan:   1. Contraception: Depo-Provera injections 2. Depo-Provera injection given in office today.  Rx sent to the pharmacy.  3. Follow up in: 3 months for Depo provera injections or as needed.

## 2017-08-24 ENCOUNTER — Encounter: Payer: Self-pay | Admitting: Certified Nurse Midwife

## 2017-11-01 ENCOUNTER — Ambulatory Visit: Payer: Medicaid Other

## 2018-02-06 ENCOUNTER — Encounter (HOSPITAL_COMMUNITY): Payer: Self-pay

## 2018-02-06 ENCOUNTER — Ambulatory Visit (HOSPITAL_COMMUNITY)
Admission: EM | Admit: 2018-02-06 | Discharge: 2018-02-06 | Disposition: A | Discharge status: Home / Self Care | Payer: Medicaid Other | Attending: Family Medicine | Admitting: Family Medicine

## 2018-02-06 DIAGNOSIS — G44209 Tension-type headache, unspecified, not intractable: Secondary | ICD-10-CM

## 2018-02-06 LAB — POCT PREGNANCY, URINE: Preg Test, Ur: NEGATIVE

## 2018-02-06 MED ORDER — IBUPROFEN 600 MG PO TABS: 600.0000 mg | ORAL_TABLET | Freq: Four times a day (QID) | ORAL | 0 refills | Status: DC | PRN

## 2018-02-06 MED ORDER — IBUPROFEN 800 MG PO TABS
800.0000 mg | ORAL_TABLET | Freq: Once | ORAL | Status: AC
  Administered 2018-02-06: 800 mg via ORAL

## 2018-02-06 MED ORDER — IBUPROFEN 800 MG PO TABS
ORAL_TABLET | ORAL | Status: AC
  Filled 2018-02-06: qty 1

## 2018-02-06 NOTE — ED Provider Notes (Signed)
[facility]    CSN: [id number] Arrival date & time: 02/06/18  1405     History   Chief Complaint Chief Complaint  Patient presents with  . Headache  . Fatigue  . Emesis    HPI Tracy [name] is a 22 y.o. female no contributing past medical history presenting today for evaluation of a headache.  Patient states that she has had an ongoing headache since Sunday evening for the past 2 to 3 days.  She has had associated weakness associated with this and feels as if when holding her baby it is very heavy.  She has felt low energy.  She has had associated nausea and occasional vomiting related to the pain.  Is able to eat and drink like normal.  Denies any diarrhea or bowel changes.  Headache initially with photophobia and hearing sounds, but this is resolved.  Headache is mainly frontal.  Described onset is gradual.  Patient typically has headaches, but are more manageable than the swelling.  She has not tried any over-the-counter medicines for this.  Patient's last menstrual period was in late October but was not a typical cycle.  Patient is not currently breast-feeding.  HPI  Past Medical History:  Diagnosis Date  . Medical history non-contributory     Patient Active Problem List   Diagnosis Date Noted  . Rubella non-immune status, antepartum 12/20/2016  . Maternal varicella, non-immune 12/20/2016  . Heart murmur previously undiagnosed 10/07/2014    Past Surgical History:  Procedure Laterality Date  . WISDOM TOOTH EXTRACTION      OB History    Gravida  1   Para  1   Term  1   Preterm      AB      Living  1     SAB      TAB      Ectopic      Multiple  0   Live Births  1            Home Medications    Prior to Admission medications   Medication Sig Start Date End Date Taking? Authorizing Provider  ibuprofen (ADVIL,MOTRIN) 600 MG tablet Take 1 tablet (600 mg total) by mouth every 6 (six) hours as needed. 02/06/18   Wilmer Berryhill C,  PA-C  medroxyPROGESTERone (DEPO-PROVERA) 150 MG/ML injection Inject 1 mL (150 mg total) into the muscle every 3 (three) months. 10/08/17   Roe Coombsenney, Rachelle A, CNM    Family History Family History  Problem Relation Age of Onset  . Thyroid disease Maternal Aunt   . Hypertension Maternal Aunt   . Thyroid disease Paternal Uncle   . Thyroid disease Paternal Grandmother     Social History Social History   Tobacco Use  . Smoking status: Former Games developermoker  . Smokeless tobacco: Never Used  . Tobacco comment: about a year ago was last use  Substance Use Topics  . Alcohol use: No    Alcohol/week: 0.0 standard drinks  . Drug use: No    Types: Marijuana    Comment: last used a year ago     Allergies   Patient has no known allergies.   Review of Systems Review of Systems  Constitutional: Negative for fatigue and fever.  HENT: Negative for congestion, sinus pressure and sore throat.   Eyes: Negative for photophobia, pain and visual disturbance.  Respiratory: Negative for cough and shortness of breath.   Cardiovascular: Negative for chest pain.  Gastrointestinal: Positive for nausea and  vomiting. Negative for abdominal pain.  Genitourinary: Negative for decreased urine volume and hematuria.  Musculoskeletal: Negative for myalgias, neck pain and neck stiffness.  Neurological: Positive for dizziness, weakness and headaches. Negative for syncope, facial asymmetry, speech difficulty, light-headedness and numbness.     Physical Exam Triage Vital Signs ED Triage Vitals  Enc Vitals Group     BP 02/06/18 1500 (!) 106/56     Pulse Rate 02/06/18 1500 85     Resp 02/06/18 1500 20     Temp 02/06/18 1500 97.8 F (36.6 C)     Temp Source 02/06/18 1500 Oral     SpO2 02/06/18 1500 100 %     Weight --      Height --      Head Circumference --      Peak Flow --      Pain Score 02/06/18 1502 6     Pain Loc --      Pain Edu? --      Excl. in GC? --    No data found.  Updated Vital  Signs BP (!) 106/56 (BP Location: Right Arm)   Pulse 85   Temp 97.8 F (36.6 C) (Oral)   Resp 20   SpO2 100%   Visual Acuity Right Eye Distance:   Left Eye Distance:   Bilateral Distance:    Right Eye Near:   Left Eye Near:    Bilateral Near:     Physical Exam  Constitutional: She is oriented to person, place, and time. She appears well-developed and well-nourished. No distress.  HENT:  Head: Normocephalic and atraumatic.  Bilateral ears without tenderness to palpation of external auricle, tragus and mastoid, EAC's without erythema or swelling, TM's with good bony landmarks and cone of light. Non erythematous.  Oral mucosa pink and moist, no tonsillar enlargement or exudate. Posterior pharynx patent and nonerythematous, no uvula deviation or swelling. Normal phonation.  Eyes: Pupils are equal, round, and reactive to light. Conjunctivae and EOM are normal.  No photophobia with exam  Neck: Neck supple.  Cardiovascular: Normal rate and regular rhythm.  No murmur heard. Pulmonary/Chest: Effort normal and breath sounds normal. No respiratory distress.  Breathing comfortably at rest, CTABL, no wheezing, rales or other adventitious sounds auscultated  Abdominal: Soft. There is no tenderness.  Musculoskeletal: She exhibits no edema.  Neurological: She is alert and oriented to person, place, and time.  Patient A&O x3, cranial nerves II-XII grossly intact, strength at shoulders, hips and knees 5/5, equal bilaterally, patellar reflex 2+ bilaterally.Gait without abnormality.  Skin: Skin is warm and dry.  Psychiatric: She has a normal mood and affect.  Nursing note and vitals reviewed.    UC Treatments / Results  Labs (all labs ordered are listed, but only abnormal results are displayed) Labs Reviewed  POCT PREGNANCY, URINE    EKG None  Radiology No results found.  Procedures Procedures (including critical care time)  Medications Ordered in UC Medications  ibuprofen  (ADVIL,MOTRIN) tablet 800 mg (800 mg Oral Given 02/06/18 1544)    Initial Impression / Assessment and Plan / UC Course  I have reviewed the triage vital signs and the nursing notes.  Pertinent labs & imaging results that were available during my care of the patient were reviewed by me and considered in my medical decision making (see chart for details).     Pregnancy test negative.  Headache for 2 days, no red flags, no neuro deficits, vital signs stable.  Discussed with patient treating  headache with injections versus oral medication since she has not tried this yet.  Patient opted for oral trial.  Will provide patient with ibuprofen in clinic today and sent home with continued use of ibuprofen and Tylenol for headache.  Continue to monitor symptoms, follow-up if not resolving, worsening, developing vision changes, true weakness.Discussed strict return precautions. Patient verbalized understanding and is agreeable with plan.  Final Clinical Impressions(s) / UC Diagnoses   Final diagnoses:  Acute non intractable tension-type headache     Discharge Instructions     Use anti-inflammatories for pain/swelling. You may take up to 800 mg Ibuprofen every 8 hours with food. You may supplement Ibuprofen with Tylenol [phone] mg every 8 hours.   Follow up if headache not resolving, developing worsening symptoms, weakness, changes in vision   ED Prescriptions    Medication Sig Dispense Auth. Provider   ibuprofen (ADVIL,MOTRIN) 600 MG tablet Take 1 tablet (600 mg total) by mouth every 6 (six) hours as needed. 30 tablet Tieasha Larsen, Leonard C, PA-C     Controlled Substance Prescriptions [place] Controlled Substance Registry consulted? Not Applicable   Lew Dawes, [place] 02/06/18 2141

## 2018-02-06 NOTE — ED Triage Notes (Signed)
Pt presents with persistent ongoing headache, body weakness and vomiting.

## 2018-02-06 NOTE — Discharge Instructions (Addendum)
Use anti-inflammatories for pain/swelling. You may take up to 800 mg Ibuprofen every 8 hours with food. You may supplement Ibuprofen with Tylenol [phone] mg every 8 hours.   Follow up if headache not resolving, developing worsening symptoms, weakness, changes in vision
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
## 2018-08-12 IMAGING — US US PELVIS COMPLETE
1 series · 14 of 25 positions shown · non-contrast
Comparison: None.

CLINICAL DATA: Pelvic pain x1 month

EXAM:
TRANSABDOMINAL AND TRANSVAGINAL ULTRASOUND OF PELVIS
DOPPLER ULTRASOUND OF OVARIES
TECHNIQUE: Both transabdominal and transvaginal ultrasound examinations of the
pelvis were performed. Transabdominal technique was performed for
global imaging of the pelvis including uterus, ovaries, adnexal
regions, and pelvic cul-de-sac.
It was necessary to proceed with endovaginal exam following the
transabdominal exam to visualize the endometrium. Color and duplex
Doppler ultrasound was utilized to evaluate blood flow to the
ovaries.

[Series 1: us pelvis complete · 0.20mm/px · 14 of 89 slices shown]
[im 1/89]
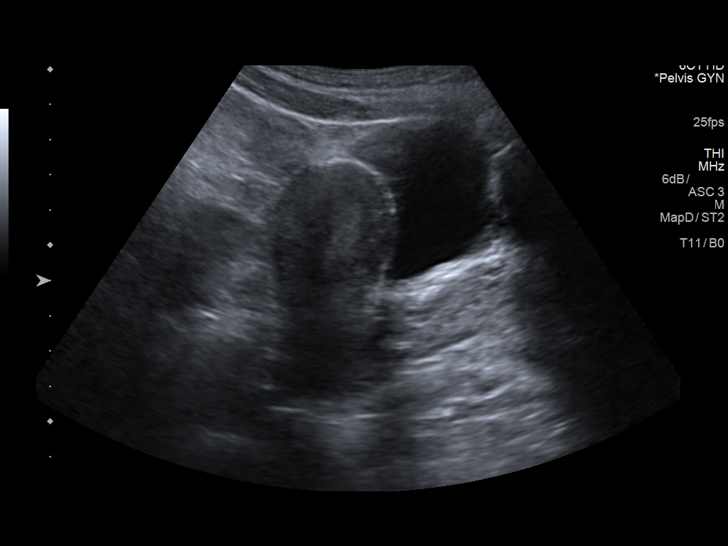
[im 8/89]
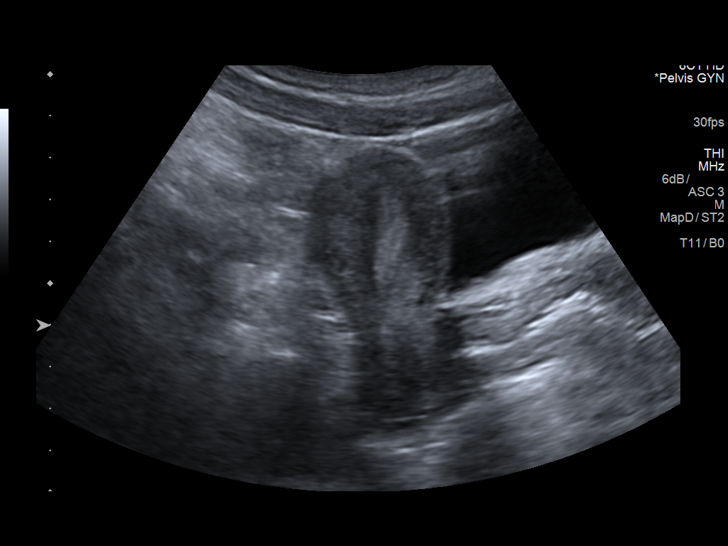
[im 15/89]
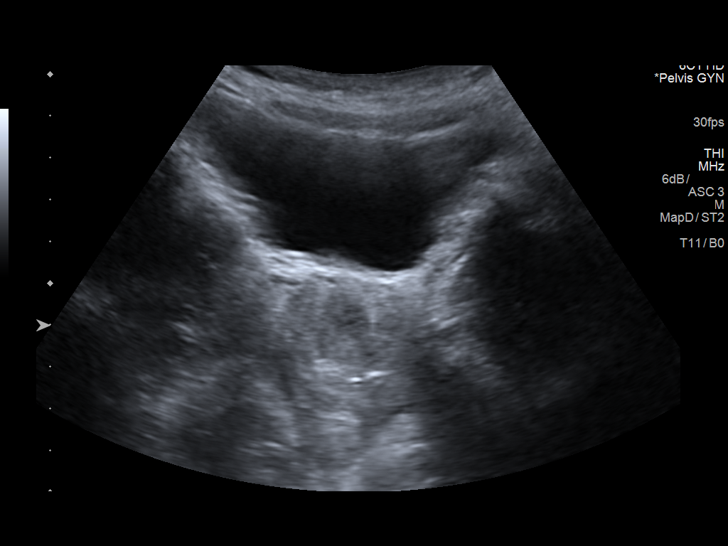
[im 23/89]
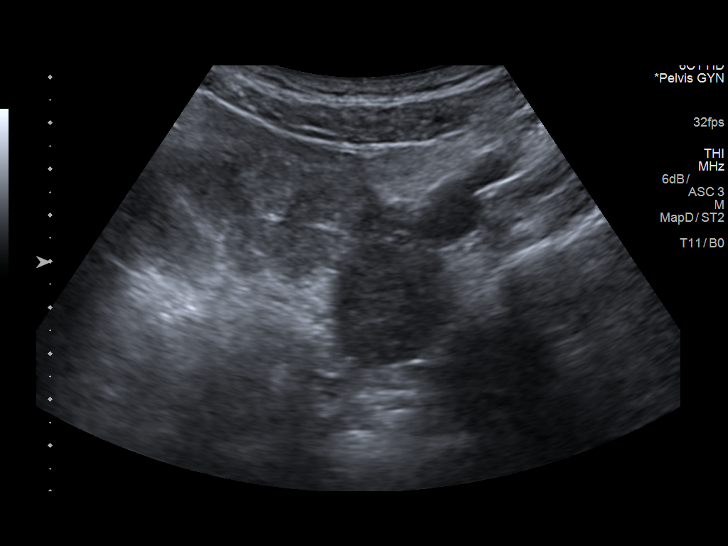
[im 30/89]
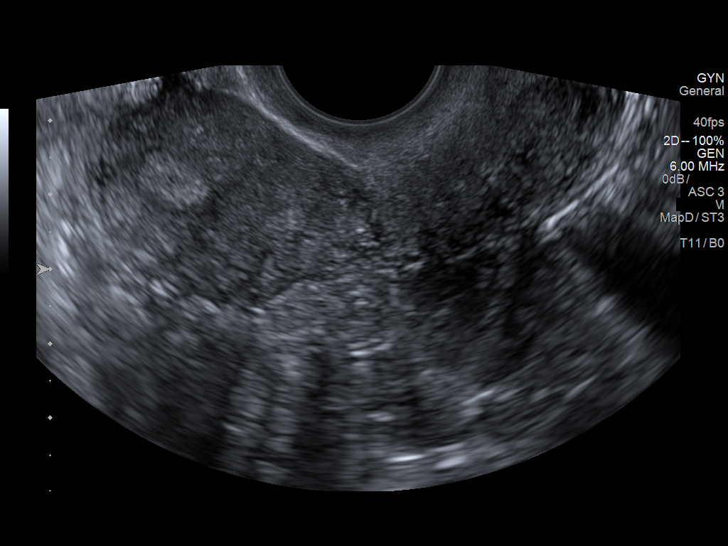
[im 34/89]
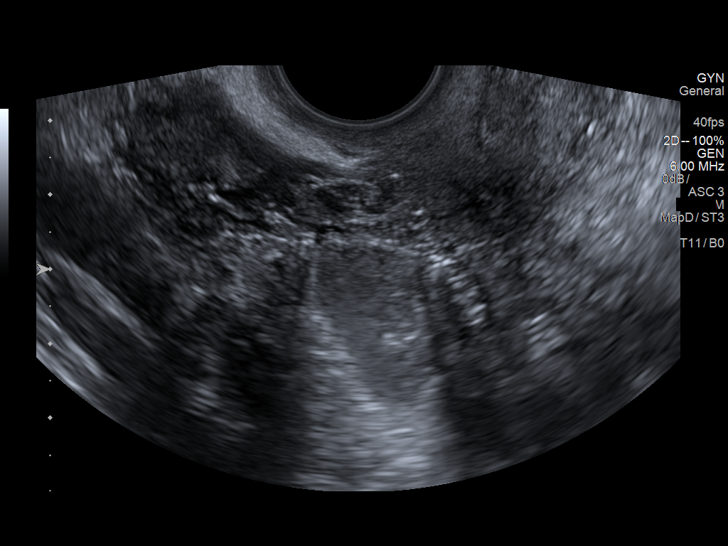
[im 41/89]
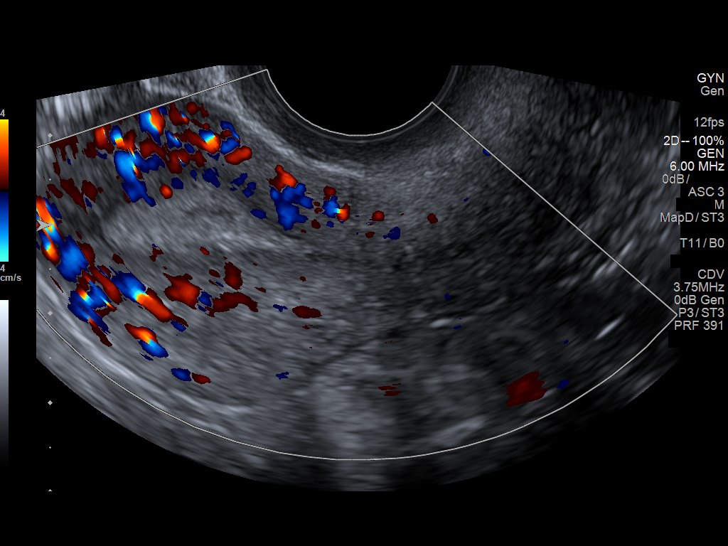
[im 48/89]
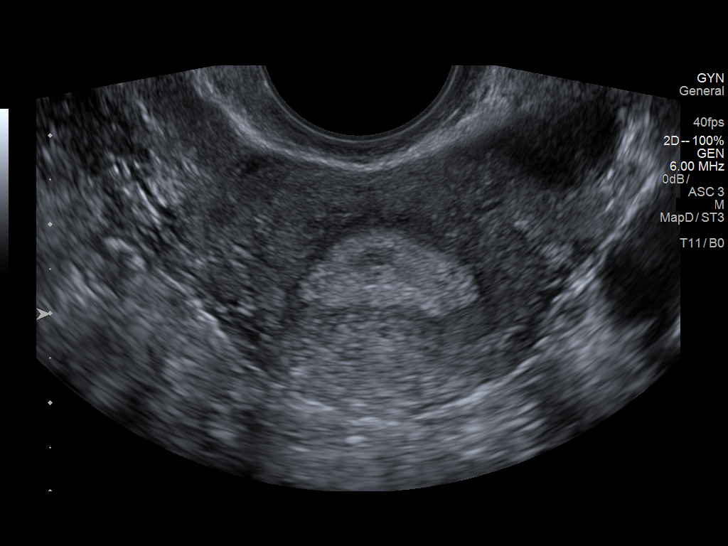
[im 56/89]
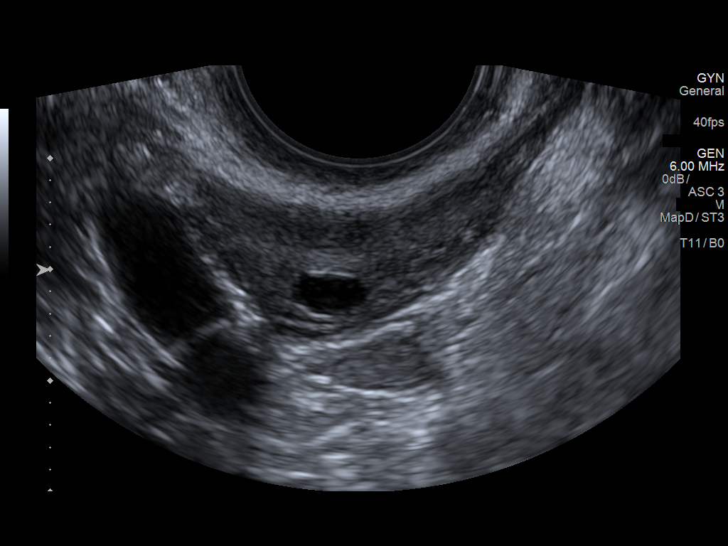
[im 59/89]
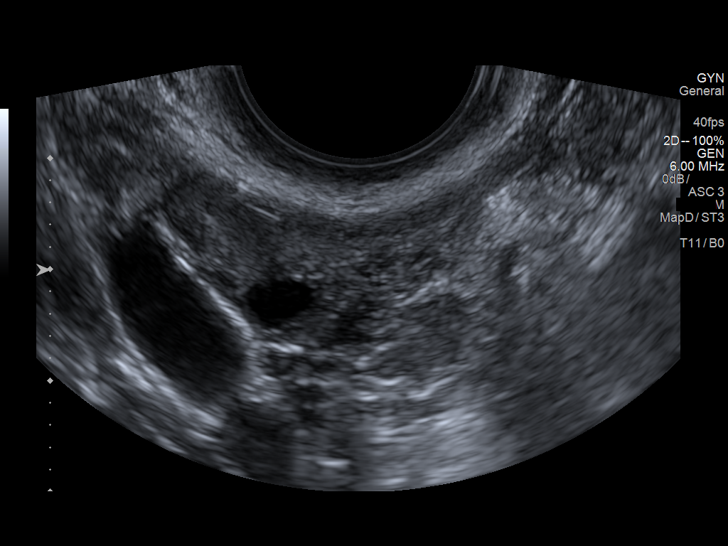
[im 67/89]
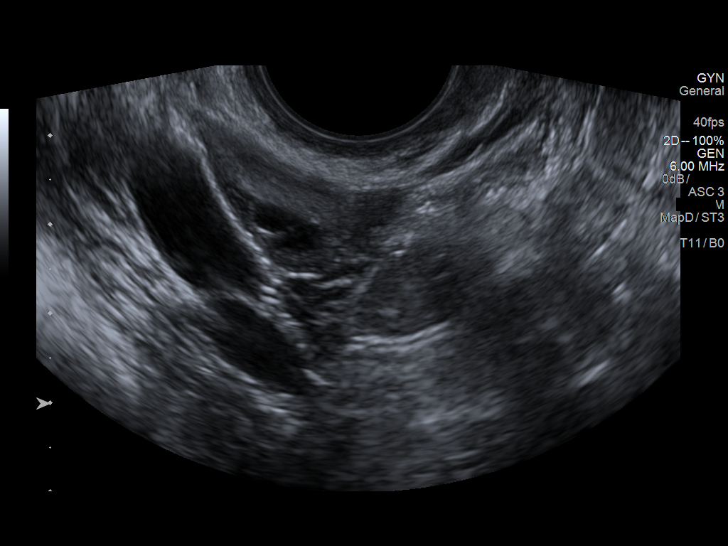
[im 74/89]
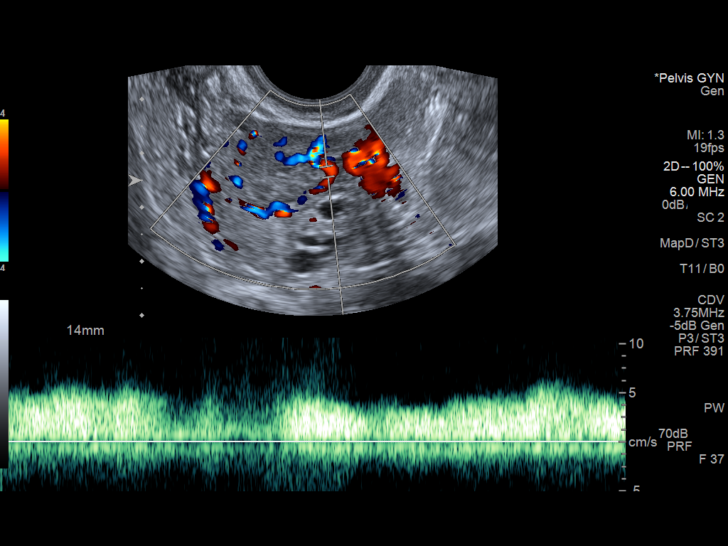
[im 81/89]
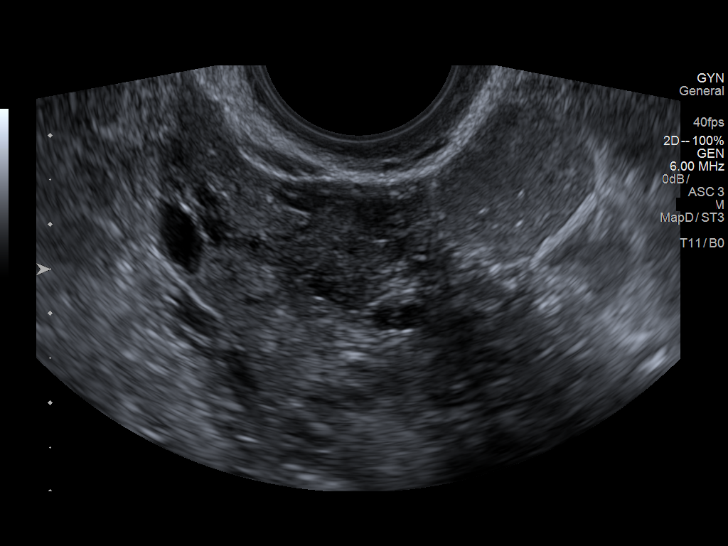
[im 89/89]
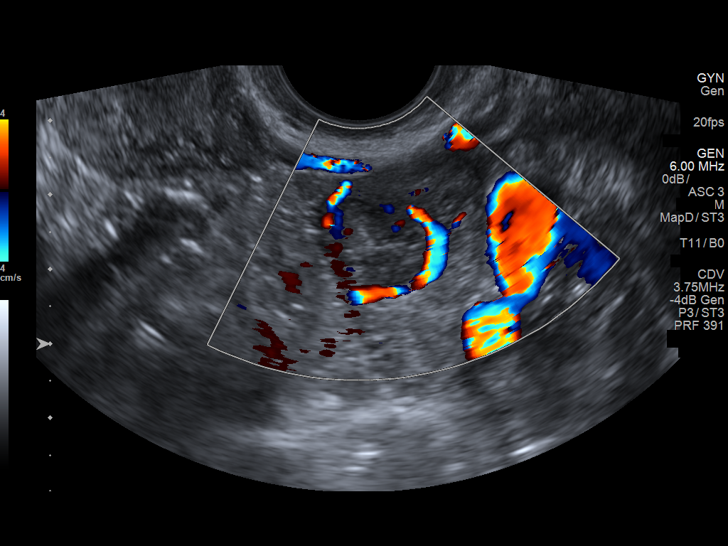

[14 of 25 positions shown; findings below may reference images not displayed]

FINDINGS: Uterus

Measurements: 7.3 x 3.4 x 4.2 cm. No fibroids or other mass
visualized.

Endometrium

Thickness: 8 mm.  No focal abnormality visualized.

Right ovary

Measurements: 2.9 x 1.1 x 1.9 cm. Normal appearance/no adnexal mass.

Left ovary

Measurements: 3.5 x 2.7 x 2.4 cm. Normal appearance/no adnexal mass,
noting a 2.1 cm corpus luteal cyst.

Pulsed Doppler evaluation of both ovaries demonstrates normal
low-resistance arterial and venous waveforms.

Other findings

No abnormal free fluid.
IMPRESSION: Negative pelvic ultrasound.

No evidence of ovarian torsion.

## 2018-12-09 ENCOUNTER — Emergency Department (HOSPITAL_COMMUNITY)
Admission: EM | Admit: 2018-12-09 | Discharge: 2018-12-09 | Disposition: A | Discharge status: Home / Self Care | Payer: Medicaid Other | Attending: Emergency Medicine | Admitting: Emergency Medicine

## 2018-12-09 ENCOUNTER — Encounter (HOSPITAL_COMMUNITY): Payer: Self-pay | Admitting: Emergency Medicine

## 2018-12-09 ENCOUNTER — Emergency Department (HOSPITAL_COMMUNITY): Payer: Medicaid Other

## 2018-12-09 DIAGNOSIS — Y9389 Activity, other specified: Secondary | ICD-10-CM | POA: Diagnosis not present

## 2018-12-09 DIAGNOSIS — Y999 Unspecified external cause status: Secondary | ICD-10-CM | POA: Diagnosis not present

## 2018-12-09 DIAGNOSIS — X503XXA Overexertion from repetitive movements, initial encounter: Secondary | ICD-10-CM | POA: Diagnosis not present

## 2018-12-09 DIAGNOSIS — S63502A Unspecified sprain of left wrist, initial encounter: Secondary | ICD-10-CM | POA: Insufficient documentation

## 2018-12-09 DIAGNOSIS — Y9289 Other specified places as the place of occurrence of the external cause: Secondary | ICD-10-CM | POA: Insufficient documentation

## 2018-12-09 DIAGNOSIS — Z87891 Personal history of nicotine dependence: Secondary | ICD-10-CM | POA: Diagnosis not present

## 2018-12-09 DIAGNOSIS — Z79899 Other long term (current) drug therapy: Secondary | ICD-10-CM | POA: Insufficient documentation

## 2018-12-09 DIAGNOSIS — S6982XA Other specified injuries of left wrist, hand and finger(s), initial encounter: Secondary | ICD-10-CM | POA: Diagnosis present

## 2018-12-09 MED ORDER — HYDROCODONE-ACETAMINOPHEN 5-325 MG PO TABS
1.0000 | ORAL_TABLET | Freq: Once | ORAL | Status: AC
  Administered 2018-12-09: 1 via ORAL
  Filled 2018-12-09: qty 1

## 2018-12-09 MED ORDER — HYDROCODONE-ACETAMINOPHEN 5-325 MG PO TABS: 1.0000 | ORAL_TABLET | Freq: Four times a day (QID) | ORAL | 0 refills | Status: DC | PRN

## 2018-12-09 NOTE — ED Notes (Signed)
Paged ortho for wrist splint.

## 2018-12-09 NOTE — Progress Notes (Signed)
Orthopedic Tech Progress Note Patient Details:  Tracy [name] August 17, 1995 [id number]  Ortho Devices Type of Ortho Device: Velcro wrist splint Ortho Device/Splint Location: lue Ortho Device/Splint Interventions: Ordered, Application, Adjustment   Post Interventions Patient Tolerated: Well Instructions Provided: Care of device, Adjustment of device   Karolee Stamps 12/09/2018, 8:25 PM

## 2018-12-09 NOTE — Discharge Instructions (Addendum)
Use wrist splint, and take Naprosyn twice daily, Norco as needed every 6 hours for pain.  Ice and elevate the wrist.  Follow-up with Dr. Stann Mainland with hand surgery if symptoms or not improving.  If you develop fevers, significantly worsening swelling, or unable to move the wrist at all, please return to the ED.

## 2018-12-09 NOTE — ED Provider Notes (Signed)
[facility] EMERGENCY DEPARTMENT Provider Note   CSN: [id number] Arrival date & time: 12/09/18  1724     History   Chief Complaint Chief Complaint  Patient presents with  . Wrist Pain    left    HPI Tracy [name] is a 23 y.o. female.     Tracy [name] is a 23 y.o. female who is healthy, presents to the emergency department for evaluation of left wrist pain.  Patient reports she first noticed pain yesterday when she woke up started as a mild pain, today pain has significantly worsened and she has noticed some slight swelling to the wrist, she has not noticed redness, but reports it is warm to the touch.  Wrist is extremely painful to touch or even with slight movement.  Pain does not radiate, and she denies any other painful joints.  Patient denies any obvious injury to the wrist.  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  Patient denies fevers or chills, does have a slightly elevated temperature 99.4 on arrival.  No other systemic symptoms.  She has been taking Aleve regularly since pain began without relief.  No other aggravating or alleviating factors.     Past Medical History:  Diagnosis Date  . Medical history non-contributory     Patient Active Problem List   Diagnosis Date Noted  . Rubella non-immune status, antepartum 12/20/2016  . Maternal varicella, non-immune 12/20/2016  . Heart murmur previously undiagnosed 10/07/2014    Past Surgical History:  Procedure Laterality Date  . WISDOM TOOTH EXTRACTION       OB History    Gravida  1   Para  1   Term  1   Preterm      AB      Living  1     SAB      TAB      Ectopic      Multiple  0   Live Births  1            Home Medications    Prior to Admission medications   Medication Sig Start Date End Date Taking? Authorizing Provider  HYDROcodone-acetaminophen (NORCO) 5-325 MG tablet Take 1 tablet by mouth every 6  (six) hours as needed. 12/09/18   Dartha Lodge, PA-C  ibuprofen (ADVIL,MOTRIN) 600 MG tablet Take 1 tablet (600 mg total) by mouth every 6 (six) hours as needed. 02/06/18   Wieters, Hallie C, PA-C  medroxyPROGESTERone (DEPO-PROVERA) 150 MG/ML injection Inject 1 mL (150 mg total) into the muscle every 3 (three) months. 10/08/17   Roe Coombs, CNM    Family History Family History  Problem Relation Age of Onset  . Thyroid disease Maternal Aunt   . Hypertension Maternal Aunt   . Thyroid disease Paternal Uncle   . Thyroid disease Paternal Grandmother     Social History Social History   Tobacco Use  . Smoking status: Former Games developer  . Smokeless tobacco: Never Used  . Tobacco comment: about a year ago was last use  Substance Use Topics  . Alcohol use: No    Alcohol/week: 0.0 standard drinks  . Drug use: No    Types: Marijuana    Comment: last used a year ago     Allergies   Patient has no known allergies.   Review of Systems Review of Systems  Constitutional: Negative for chills and fever.  Musculoskeletal: Positive for  arthralgias and joint swelling.  Skin: Negative for color change and wound.  Neurological: Negative for weakness and numbness.     Physical Exam Updated Vital Signs BP 117/76 (BP Location: Right Arm)   Pulse 67   Temp 99.4 F (37.4 C) (Oral)   Resp 18   SpO2 100%   Physical Exam Vitals signs and nursing note reviewed.  Constitutional:      General: She is not in acute distress.    Appearance: Normal appearance. She is well-developed and normal weight. She is not ill-appearing or diaphoretic.  HENT:     Head: Normocephalic and atraumatic.  Eyes:     General:        Right eye: No discharge.        Left eye: No discharge.  Pulmonary:     Effort: Pulmonary effort is normal. No respiratory distress.  Musculoskeletal:     Comments: Left wrist extremely tender to even light palpation, there is slight swelling noted, warmth without erythema.   Extreme tenderness with range of motion.  Swelling does not extend into the hand and hand is nontender to palpation, tenderness does not progress into the forearm.  2+ radial pulse and good capillary refill, normal sensation and 5/5 strength.  Skin:    General: Skin is warm and dry.     Capillary Refill: Capillary refill takes less than 2 seconds.  Neurological:     Mental Status: She is alert and oriented to person, place, and time.     Coordination: Coordination normal.  Psychiatric:        Mood and Affect: Mood normal.        Behavior: Behavior normal.      ED Treatments / Results  Labs (all labs ordered are listed, but only abnormal results are displayed) Labs Reviewed - No data to display  EKG None  Radiology Dg Wrist Complete Left  Result Date: 12/09/2018 CLINICAL DATA:  Wrist pain, swollen EXAM: LEFT WRIST - COMPLETE 3+ VIEW COMPARISON:  None. FINDINGS: There is no evidence of fracture or dislocation. There is no evidence of arthropathy or other focal bone abnormality. Soft tissues are radiographically unremarkable. IMPRESSION: Negative. Electronically Signed   By: Lovena Le M.D.   On: 12/09/2018 19:18    Procedures Procedures (including critical care time)  Medications Ordered in ED Medications  HYDROcodone-acetaminophen (NORCO/VICODIN) 5-325 MG per tablet 1 tablet (1 tablet Oral Given 12/09/18 1819)     Initial Impression / Assessment and Plan / ED Course  I have reviewed the triage vital signs and the nursing notes.  Pertinent labs & imaging results that were available during my care of the patient were reviewed by me and considered in my medical decision making (see chart for details).  23 year old female presents with 2 days of worsening left wrist pain, denies traumatic injury, noticed slight swelling and it is warm to the touch.  Initially with low-grade fever, no other medical problems or previous history of joint infections.  While there is slight swelling  the joint is not erythematous there is not significant effusion on physical exam but I am concerned by the amount of tenderness without specific traumatic injury.  Patient does report pulling heavy carts at work, and could certainly have a wrist sprain.  X-ray is unremarkable and the wrist is neurovascularly intact.  Dr. Faye Ramsay performed a bedside ultrasound which showed no evidence of joint effusion making septic arthritis extremely unlikely.  Likely wrist sprain, will treat with wrist brace, pain medication,  NSAIDs, ice and elevation, hand follow-up if not improving.  Discussed this plan with patient and she expresses understanding and agreement.  Work note provided.  Discharged home in good condition.  Final Clinical Impressions(s) / ED Diagnoses   Final diagnoses:  Sprain of left wrist, initial encounter    ED Discharge Orders         Ordered    HYDROcodone-acetaminophen (NORCO) 5-325 MG tablet  Every 6 hours PRN     12/09/18 2033           Dartha LodgeFord, Kelsey N, PA-C 12/10/18 2317    Charlynne PanderYao, David Hsienta, MD 12/18/18 2306

## 2018-12-09 NOTE — ED Triage Notes (Signed)
Pt here with c/o pain and slight swelling to the left wrist , no trauma noted

## 2018-12-09 NOTE — ED Notes (Signed)
Patient verbalizes understanding of discharge instructions. Opportunity for questioning and answers were provided. Armband removed by staff, pt discharged from ED.  

## 2018-12-09 NOTE — ED Notes (Signed)
Dr Darl Householder at bedside with [place]

## 2019-05-13 ENCOUNTER — Encounter (HOSPITAL_COMMUNITY): Payer: Self-pay

## 2019-05-13 ENCOUNTER — Ambulatory Visit (HOSPITAL_COMMUNITY)
Admission: EM | Admit: 2019-05-13 | Discharge: 2019-05-13 | Disposition: A | Discharge status: Home / Self Care | Payer: Medicaid Other | Attending: Family Medicine | Admitting: Family Medicine

## 2019-05-13 ENCOUNTER — Other Ambulatory Visit: Payer: Self-pay

## 2019-05-13 DIAGNOSIS — Z3202 Encounter for pregnancy test, result negative: Secondary | ICD-10-CM | POA: Diagnosis not present

## 2019-05-13 DIAGNOSIS — N912 Amenorrhea, unspecified: Secondary | ICD-10-CM | POA: Diagnosis present

## 2019-05-13 LAB — HCG, QUANTITATIVE, PREGNANCY: hCG, Beta Chain, Quant, S: <1 m[IU]/mL (ref <5)

## 2019-05-13 LAB — POCT PREGNANCY, URINE: Preg Test, Ur: NEGATIVE

## 2019-05-13 LAB — POC URINE PREG, ED: Preg Test, Ur: NEGATIVE

## 2019-05-13 NOTE — ED Triage Notes (Addendum)
Pt is here with a concern of possible pregnancy, wants a pregnacy test. States she did not have a period in Jan 2021 & she only bleed for 2 days in February 2021(2/14-2/15). States she had sex last night & was bleeding but no blood this morning.

## 2019-05-13 NOTE — ED Provider Notes (Signed)
[place]    ASSESSMENT & PLAN:  1. Amenorrhea     UPT negative.  Pending: Labs Reviewed  HCG, QUANTITATIVE, PREGNANCY   Will notify her of a positive result.   Outlined signs and symptoms indicating need for more acute intervention. Patient verbalized understanding. After Visit Summary given.  SUBJECTIVE:  Tracy [name] is a 24 y.o. female who complains of not having a menstrual period in Jan. Slight vaginal bleeding for 1-2 days in Feb. Requests pregnancy test. Is sexually active. No n/v/d. No abdominal or pelvic pain.  LMP: Patient's last menstrual period was 02/12/2019.   OBJECTIVE:  Vitals:   05/13/19 1346 05/13/19 1349  BP:  100/66  Pulse:  72  Resp:  16  Temp:  98.2 F (36.8 C)  TempSrc:  Oral  SpO2:  99%  Weight: 65.8 kg    General appearance: alert; no distress Lungs: unlabored respirations Abdomen: soft Extremities: no edema; symmetrical with no gross deformities Skin: warm and dry Neurologic: normal gait Psychological: alert and cooperative; normal mood and affect  Labs Reviewed  HCG, QUANTITATIVE, PREGNANCY  POC URINE PREG, ED  POCT PREGNANCY, URINE    No Known Allergies  Past Medical History:  Diagnosis Date  . Medical history non-contributory    Social History   Socioeconomic History  . Marital status: Single    Spouse name: Not on file  . Number of children: Not on file  . Years of education: Not on file  . Highest education level: Not on file  Occupational History  . Not on file  Tobacco Use  . Smoking status: Former Research scientist (life sciences)  . Smokeless tobacco: Never Used  . Tobacco comment: about a year ago was last use  Substance and Sexual Activity  . Alcohol use: No    Alcohol/week: 0.0 standard drinks  . Drug use: No    Types: Marijuana    Comment: last used a year ago  . Sexual activity: Yes    Birth control/protection: None  Other Topics Concern  . Not on file  Social History Narrative  . Not on file    Social Determinants of Health   Financial Resource Strain:   . Difficulty of Paying Living Expenses: Not on file  Food Insecurity:   . Worried About Charity fundraiser in the Last Year: Not on file  . Ran Out of Food in the Last Year: Not on file  Transportation Needs:   . Lack of Transportation (Medical): Not on file  . Lack of Transportation (Non-Medical): Not on file  Physical Activity:   . Days of Exercise per Week: Not on file  . Minutes of Exercise per Session: Not on file  Stress:   . Feeling of Stress : Not on file  Social Connections:   . Frequency of Communication with Friends and Family: Not on file  . Frequency of Social Gatherings with Friends and Family: Not on file  . Attends Religious Services: Not on file  . Active Member of Clubs or Organizations: Not on file  . Attends Archivist Meetings: Not on file  . Marital Status: Not on file  Intimate Partner Violence:   . Fear of Current or Ex-Partner: Not on file  . Emotionally Abused: Not on file  . Physically Abused: Not on file  . Sexually Abused: Not on file   Family History  Problem Relation Age of Onset  . Thyroid disease Maternal Aunt   . Hypertension Maternal Aunt   .  Thyroid disease Paternal Uncle   . Thyroid disease Paternal Grandmother   . Healthy Mother   . Healthy Father        Mardella Layman, MD 05/13/19 [phone]

## 2019-05-14 ENCOUNTER — Encounter (HOSPITAL_COMMUNITY): Payer: Self-pay

## 2020-04-02 ENCOUNTER — Ambulatory Visit (HOSPITAL_COMMUNITY)
Admission: EM | Admit: 2020-04-02 | Discharge: 2020-04-02 | Disposition: A | Discharge status: Home / Self Care | Payer: Medicaid Other | Attending: Family Medicine | Admitting: Family Medicine

## 2020-04-02 ENCOUNTER — Encounter (HOSPITAL_COMMUNITY): Payer: Self-pay

## 2020-04-02 ENCOUNTER — Other Ambulatory Visit: Payer: Self-pay

## 2020-04-02 DIAGNOSIS — Z3201 Encounter for pregnancy test, result positive: Secondary | ICD-10-CM

## 2020-04-02 LAB — POC URINE PREG, ED: Preg Test, Ur: POSITIVE — AB

## 2020-04-02 NOTE — ED Triage Notes (Signed)
Pt reports taking two pregnancy test at home that resulted positive. She states she has been feeling fatigue and breast tenderness.

## 2020-04-02 NOTE — ED Provider Notes (Signed)
[facility]    CSN: [id number] Arrival date & time: 04/02/20  1211      History   Chief Complaint Chief Complaint  Patient presents with  . Possible Pregnancy  . Fatigue    HPI Tracy [name] is a 25 y.o. female.   Here today requesting pregnancy confirmation. Has taken 2 home pregnancy tests the past week that have been positive. LMP was 02/01/21. Having some fatigue and breast tenderness, no vaginal bleeding, discharge, pelvic or abdominal pain, N/V. Uncomplicated first pregnancy with term delivery. Does not currently have an OBGYN.      Past Medical History:  Diagnosis Date  . Medical history non-contributory     Patient Active Problem List   Diagnosis Date Noted  . Rubella non-immune status, antepartum 12/20/2016  . Maternal varicella, non-immune 12/20/2016  . Heart murmur previously undiagnosed 10/07/2014    Past Surgical History:  Procedure Laterality Date  . WISDOM TOOTH EXTRACTION      OB History    Gravida  1   Para  1   Term  1   Preterm      AB      Living  1     SAB      IAB      Ectopic      Multiple  0   Live Births  1            Home Medications    Prior to Admission medications   Medication Sig Start Date End Date Taking? Authorizing Provider  HYDROcodone-acetaminophen (NORCO) 5-325 MG tablet Take 1 tablet by mouth every 6 (six) hours as needed. 12/09/18   Dartha Lodge, PA-C  ibuprofen (ADVIL,MOTRIN) 600 MG tablet Take 1 tablet (600 mg total) by mouth every 6 (six) hours as needed. 02/06/18   Wieters, Hallie C, PA-C  medroxyPROGESTERone (DEPO-PROVERA) 150 MG/ML injection Inject 1 mL (150 mg total) into the muscle every 3 (three) months. 10/08/17   Orvilla Cornwall A, CNM  predniSONE (DELTASONE) 10 MG tablet PLEASE SEE ATTACHED FOR DETAILED DIRECTIONS 12/18/18   [provider]  predniSONE (STERAPRED UNI-PAK 21 TAB) 10 MG (21) TBPK tablet TAKE 6 TABLETS ON DAY 1 AS DIRECTED ON PACKAGE AND DECREASE  BY 1 TAB EACH DAY FOR A TOTAL OF 6 DAYS 12/11/18   [provider]    Family History Family History  Problem Relation Age of Onset  . Thyroid disease Maternal Aunt   . Hypertension Maternal Aunt   . Thyroid disease Paternal Uncle   . Thyroid disease Paternal Grandmother   . Healthy Mother   . Healthy Father     Social History Social History   Tobacco Use  . Smoking status: Former Games developer  . Smokeless tobacco: Never Used  . Tobacco comment: about a year ago was last use  Substance Use Topics  . Alcohol use: No    Alcohol/week: 0.0 standard drinks  . Drug use: No    Types: Marijuana    Comment: last used a year ago     Allergies   Patient has no known allergies.   Review of Systems Review of Systems PER HPI   Physical Exam Triage Vital Signs ED Triage Vitals  Enc Vitals Group     BP 04/02/20 1353 (!) 108/57     Pulse Rate 04/02/20 1353 60     Resp 04/02/20 1353 18     Temp 04/02/20 1353 98.2 F (36.8 C)     Temp src --  SpO2 04/02/20 1353 100 %     Weight --      Height --      Head Circumference --      Peak Flow --      Pain Score 04/02/20 1351 0     Pain Loc --      Pain Edu? --      Excl. in GC? --    No data found.  Updated Vital Signs BP (!) 108/57 (BP Location: Right Arm)   Pulse 60   Temp 98.2 F (36.8 C)   Resp 18   LMP 02/02/2020 (Exact Date)   SpO2 100%   Visual Acuity Right Eye Distance:   Left Eye Distance:   Bilateral Distance:    Right Eye Near:   Left Eye Near:    Bilateral Near:     Physical Exam Vitals and nursing note reviewed.  Constitutional:      Appearance: Normal appearance. She is not ill-appearing.  HENT:     Head: Atraumatic.     Nose: Nose normal.     Mouth/Throat:     Mouth: Mucous membranes are moist.     Pharynx: Oropharynx is clear.  Eyes:     Extraocular Movements: Extraocular movements intact.     Conjunctiva/sclera: Conjunctivae normal.  Cardiovascular:     Rate and Rhythm: Normal  rate and regular rhythm.     Heart sounds: Normal heart sounds.  Pulmonary:     Effort: Pulmonary effort is normal.     Breath sounds: Normal breath sounds.  Abdominal:     General: Bowel sounds are normal. There is no distension.     Palpations: Abdomen is soft.     Tenderness: There is no abdominal tenderness. There is no right CVA tenderness, left CVA tenderness or guarding.  Genitourinary:    Comments: GU exam deferred to GYN Musculoskeletal:        General: Normal range of motion.     Cervical back: Normal range of motion and neck supple.  Skin:    General: Skin is warm and dry.  Neurological:     Mental Status: She is alert and oriented to person, place, and time.  Psychiatric:        Mood and Affect: Mood normal.        Thought Content: Thought content normal.        Judgment: Judgment normal.      UC Treatments / Results  Labs (all labs ordered are listed, but only abnormal results are displayed) Labs Reviewed  POC URINE PREG, ED - Abnormal; Notable for the following components:      Result Value   Preg Test, Ur POSITIVE (*)    All other components within normal limits    EKG   Radiology No results found.  Procedures Procedures (including critical care time)  Medications Ordered in UC Medications - No data to display  Initial Impression / Assessment and Plan / UC Course  I have reviewed the triage vital signs and the nursing notes.  Pertinent labs & imaging results that were available during my care of the patient were reviewed by me and considered in my medical decision making (see chart for details).     Urine preg positive here. Discussed prenatal vitamins, d/c of cigarettes, healthy lifestyle, close OBGYN f/u in next few weeks for initial OB. Return precautions reviewed.   Final Clinical Impressions(s) / UC Diagnoses   Final diagnoses:  Positive pregnancy test   Discharge Instructions  None    ED Prescriptions    None     PDMP not  reviewed this encounter.   Particia Nearing, [place] 04/02/20 1422

## 2020-04-06 ENCOUNTER — Ambulatory Visit (INDEPENDENT_AMBULATORY_CARE_PROVIDER_SITE_OTHER): Payer: Medicaid Other

## 2020-04-06 ENCOUNTER — Other Ambulatory Visit: Payer: Self-pay

## 2020-04-06 VITALS — BP 109/71 | HR 74 | Wt 133.0 lb

## 2020-04-06 DIAGNOSIS — N926 Irregular menstruation, unspecified: Secondary | ICD-10-CM

## 2020-04-06 DIAGNOSIS — Z3480 Encounter for supervision of other normal pregnancy, unspecified trimester: Secondary | ICD-10-CM

## 2020-04-06 HISTORY — DX: Encounter for supervision of other normal pregnancy, unspecified trimester: Z34.80

## 2020-04-06 MED ORDER — GOJJI WEIGHT SCALE MISC: 1.0000 | 0 refills | Status: DC | PRN

## 2020-04-06 MED ORDER — BLOOD PRESSURE KIT DEVI: 0 refills | Status: DC

## 2020-04-06 MED ORDER — PREPLUS 27-1 MG PO TABS: 1.0000 | ORAL_TABLET | Freq: Every day | ORAL | 13 refills | Status: DC

## 2020-04-06 NOTE — Progress Notes (Signed)
Ms. Sensabaugh presents today for UPT. She has no unusual complaints. LMP: 02/02/20     OBJECTIVE: Appears well, in no apparent distress.  OB History    Gravida  2   Para  1   Term  1   Preterm      AB      Living  1     SAB      IAB      Ectopic      Multiple  0   Live Births  1          Home UPT Result: positive  In-Office UPT result: positive  I have reviewed the patient's medical, obstetrical, social, and family histories, and medications.   ASSESSMENT: Positive pregnancy test  PLAN Prenatal care to be completed at:  [facility]  Schedule NOB intake and provider visit  PNV sent to the pharmacy for patient to start BP cuff and scale sent to the pharmacy Pt to download Babyscripts

## 2020-04-09 NOTE — Progress Notes (Signed)
Patient was assessed and managed by nursing staff during this encounter. I have reviewed the chart and agree with the documentation and plan. I have also made any necessary editorial changes.  Marguerette Sheller, MD 04/09/2020 8:31 AM    

## 2020-04-16 ENCOUNTER — Other Ambulatory Visit: Payer: Self-pay

## 2020-04-16 ENCOUNTER — Ambulatory Visit: Payer: Medicaid Other

## 2020-04-16 ENCOUNTER — Ambulatory Visit (INDEPENDENT_AMBULATORY_CARE_PROVIDER_SITE_OTHER): Payer: Medicaid Other

## 2020-04-16 VITALS — BP 112/66 | HR 80 | Ht 65.0 in | Wt 134.4 lb

## 2020-04-16 DIAGNOSIS — Z789 Other specified health status: Secondary | ICD-10-CM

## 2020-04-16 DIAGNOSIS — Z3480 Encounter for supervision of other normal pregnancy, unspecified trimester: Secondary | ICD-10-CM

## 2020-04-16 DIAGNOSIS — O3680X0 Pregnancy with inconclusive fetal viability, not applicable or unspecified: Secondary | ICD-10-CM

## 2020-04-16 NOTE — Progress Notes (Signed)
PRENATAL INTAKE SUMMARY  Tracy [name] presents today New OB Nurse Interview.  OB History    Gravida  3   Para  1   Term  1   Preterm      AB  1   Living  1     SAB      IAB  1   Ectopic      Multiple  0   Live Births  1          I have reviewed the patient's medical, obstetrical, social, and family histories, medications, and available lab results.  SUBJECTIVE She has no unusual complaints  OBJECTIVE Initial Physical Exam (New OB)  GENERAL APPEARANCE: alert, well appearing   ASSESSMENT Normal pregnancy  PLAN Prenatal care to be completed at [facility] New OB labs to be completed at [facility] Provider visit Baby Scripts Ordered Patient has blood pressure cuff U/S performed today reveals single live IUP at [redacted]w[redacted]d by CRL. FHR 116 PHQ2 score: 0 GAD 7 score:0

## 2020-04-23 ENCOUNTER — Encounter: Payer: Medicaid Other | Admitting: Obstetrics and Gynecology

## 2020-05-07 ENCOUNTER — Other Ambulatory Visit: Payer: Self-pay

## 2020-05-07 ENCOUNTER — Encounter (HOSPITAL_COMMUNITY): Payer: Self-pay | Admitting: Obstetrics & Gynecology

## 2020-05-07 ENCOUNTER — Inpatient Hospital Stay (HOSPITAL_COMMUNITY)
Admission: AD | Admit: 2020-05-07 | Discharge: 2020-05-08 | Disposition: A | Discharge status: Home / Self Care | Payer: Medicaid Other | Attending: Obstetrics & Gynecology | Admitting: Obstetrics & Gynecology

## 2020-05-07 ENCOUNTER — Inpatient Hospital Stay (HOSPITAL_COMMUNITY): Payer: Medicaid Other

## 2020-05-07 DIAGNOSIS — Z3A09 9 weeks gestation of pregnancy: Secondary | ICD-10-CM | POA: Diagnosis not present

## 2020-05-07 DIAGNOSIS — O209 Hemorrhage in early pregnancy, unspecified: Secondary | ICD-10-CM | POA: Diagnosis present

## 2020-05-07 DIAGNOSIS — Z87891 Personal history of nicotine dependence: Secondary | ICD-10-CM | POA: Diagnosis not present

## 2020-05-07 DIAGNOSIS — M545 Low back pain, unspecified: Secondary | ICD-10-CM | POA: Diagnosis not present

## 2020-05-07 DIAGNOSIS — O468X1 Other antepartum hemorrhage, first trimester: Secondary | ICD-10-CM | POA: Diagnosis not present

## 2020-05-07 DIAGNOSIS — Z3A10 10 weeks gestation of pregnancy: Secondary | ICD-10-CM | POA: Diagnosis not present

## 2020-05-07 DIAGNOSIS — O208 Other hemorrhage in early pregnancy: Secondary | ICD-10-CM | POA: Insufficient documentation

## 2020-05-07 DIAGNOSIS — O26891 Other specified pregnancy related conditions, first trimester: Secondary | ICD-10-CM | POA: Insufficient documentation

## 2020-05-07 DIAGNOSIS — O418X10 Other specified disorders of amniotic fluid and membranes, first trimester, not applicable or unspecified: Secondary | ICD-10-CM | POA: Diagnosis present

## 2020-05-07 NOTE — MAU Provider Note (Signed)
Chief Complaint: Vaginal Bleeding   Event Date/Time   First Provider Initiated Contact with Patient 05/07/20 2317        SUBJECTIVE HPI: Tracy [name] is a 25 y.o. G3P1011 at 6w3dby LMP who presents to maternity admissions reporting vaginal bleeding.  Has some low back pain at times. She denies vaginal itching/burning, urinary symptoms, h/a, dizziness, n/v, or fever/chills.    Vaginal Bleeding The patient's primary symptoms include vaginal bleeding. The patient's pertinent negatives include no genital itching, genital lesions, genital odor or pelvic pain. This is a new problem. The current episode started today. The problem occurs intermittently. The problem has been rapidly improving. The pain is mild. The problem affects both sides. She is pregnant. Associated symptoms include back pain. Pertinent negatives include no abdominal pain, constipation, diarrhea, dysuria, fever, nausea or vomiting. The vaginal discharge was bloody. The vaginal bleeding is spotting. She has not been passing clots. She has not been passing tissue. Nothing aggravates the symptoms. She has tried nothing for the symptoms.   RN Note: Pt reports that she had a bowel movement and after she wiped and saw some bright red blood on the tissue. Pt reports she wiped again and saw more.  Pt also reports back cramps that have been ongoing in the pregnancy.   Past Medical History:  Diagnosis Date  . Medical history non-contributory   . Supervision of other normal pregnancy, antepartum 04/06/2020   Past Surgical History:  Procedure Laterality Date  . WISDOM TOOTH EXTRACTION     Social History   Socioeconomic History  . Marital status: Single    Spouse name: Not on file  . Number of children: Not on file  . Years of education: Not on file  . Highest education level: Not on file  Occupational History  . Not on file  Tobacco Use  . Smoking status: Former SResearch scientist (life sciences) . Smokeless tobacco: Never Used  . Tobacco  comment: last used when confirmed pregnancy  Vaping Use  . Vaping Use: Never used  Substance and Sexual Activity  . Alcohol use: No    Alcohol/week: 0.0 standard drinks  . Drug use: No    Types: Marijuana    Comment: last used a year ago  . Sexual activity: Yes    Birth control/protection: None  Other Topics Concern  . Not on file  Social History Narrative  . Not on file   Social Determinants of Health   Financial Resource Strain: Not on file  Food Insecurity: Not on file  Transportation Needs: Not on file  Physical Activity: Not on file  Stress: Not on file  Social Connections: Not on file  Intimate Partner Violence: Not on file   No current facility-administered medications on file prior to encounter.   Current Outpatient Medications on File Prior to Encounter  Medication Sig Dispense Refill  . Prenatal Vit-Fe Fumarate-FA (PREPLUS) 27-1 MG TABS Take 1 tablet by mouth daily. 30 tablet 13  . Blood Pressure Monitoring (BLOOD PRESSURE KIT) DEVI Check BP readings at home regularly Large cuff z34.80 1 each 0  . Misc. Devices (GOJJI WEIGHT SCALE) MISC 1 Device by Does not apply route as needed. 1 each 0   No Known Allergies  I have reviewed patient's Past Medical Hx, Surgical Hx, Family Hx, Social Hx, medications and allergies.   ROS:  Review of Systems  Constitutional: Negative for fever.  Gastrointestinal: Negative for abdominal pain, constipation, diarrhea, nausea and vomiting.  Genitourinary: Positive for vaginal bleeding. Negative  for dysuria and pelvic pain.  Musculoskeletal: Positive for back pain.   Review of Systems  Other systems negative   Physical Exam  Physical Exam No data found. Constitutional: Well-developed, well-nourished female in no acute distress.  Cardiovascular: normal rate Respiratory: normal effort GI: Abd soft, non-tender.  MS: Extremities nontender, no edema, normal ROM Neurologic: Alert and oriented x 4.  GU: Neg CVAT.  PELVIC EXAM:  Cervix pink, visually closed, without lesion, scant light red discharge, vaginal walls and external genitalia normal   LAB RESULTS No results found for this or any previous visit (from the past 24 hour(s)).  Blood type A+   IMAGING US OB Comp Less 14 Wks  Result Date: 05/08/2020 CLINICAL DATA:  Early pregnancy with bleeding EXAM: OBSTETRIC <14 WK [place] AND TRANSVAGINAL OB US TECHNIQUE: Both transabdominal and transvaginal ultrasound examinations were performed for complete evaluation of the gestation as well as the maternal uterus, adnexal regions, and pelvic cul-de-sac. Transvaginal technique was performed to assess early pregnancy. COMPARISON:  04/16/2020 FINDINGS: Intrauterine gestational sac: Single, present. Yolk sac:  Not visible. Embryo:  Present Cardiac Activity: Present Heart Rate: 171 bpm CRL:  34.5 mm   10 w   2 d                  [place] EDC: 12/01/2020 Subchorionic hemorrhage:  Small amount of subchorionic hemorrhage. Maternal uterus/adnexae: Ovaries appear normal. Corpus luteum noted on the right. No free fluid. IMPRESSION: Single living intrauterine pregnancy at 10 weeks 2 days by crown-rump length. Appropriate interval growth. Small amount of subchorionic hemorrhage. Electronically Signed   By: Nelson Chimes M.D.   On: 05/08/2020 00:09     MAU Management/MDM: Ordered Ultrasound to rule out SAB  Reviewed findings of small subchorionic hemorrhage Discussed small and moderate ones are not statistically associated with SAB Her [facility] is small Recommend pelvic rest for 2 weeks Bleeding precautions  ASSESSMENT Single IUP at 49w4dFirst trimester bleeding Small subchorionic hemorrhage  PLAN Discharge home Pelvic rest Followup in office  Pt stable at time of discharge. Encouraged to return here if she develops worsening of symptoms, increase in pain, fever, or other concerning symptoms.    MHansel FeinsteinCNM, MSN Certified Nurse-Midwife 05/07/2020  11:17 PM

## 2020-05-07 NOTE — MAU Note (Addendum)
Pt reports that she had a bowel movement and after she wiped and saw some bright red blood on the tissue. Pt reports she wiped again and saw more.   Pt also reports back cramps that have been ongoing in the pregnancy.

## 2020-05-08 DIAGNOSIS — O468X1 Other antepartum hemorrhage, first trimester: Secondary | ICD-10-CM | POA: Diagnosis present

## 2020-05-08 DIAGNOSIS — O418X10 Other specified disorders of amniotic fluid and membranes, first trimester, not applicable or unspecified: Secondary | ICD-10-CM | POA: Diagnosis present

## 2020-05-08 NOTE — Discharge Instructions (Signed)
Activity Restriction During Pregnancy Your health care provider may recommend specific activity restrictions during pregnancy for a variety of reasons. Activity restriction may require that you limit activities that require great effort, such as exercise, lifting, or sex. The type of activity restriction will vary for each person, depending on your risk or the problems you are having. Activity restriction may be recommended for a period of time until your baby is delivered. Why are activity restrictions recommended? Activity restriction may be recommended if:  Your placenta is partially or completely covering the opening of your cervix (placenta previa).  There is bleeding between the wall of the uterus and the amniotic sac in the first trimester of pregnancy (subchorionic hemorrhage).  You went into labor too early (preterm labor).  You have a history of miscarriage.  You have a condition that causes high blood pressure during pregnancy (preeclampsia or eclampsia).  You are pregnant with more than one baby.  Your baby is not growing well. What are the risks? The risks depend on your specific restriction. Strict bed rest has the most physical and emotional risks and is no longer routinely recommended. Risks of strict bed rest include:  Loss of muscle conditioning from not moving.  Blood clots.  Social isolation.  Depression.  Loss of income. Talk with your health care team about activity restriction to decide if it is best for you and your baby. Even if you are having problems during your pregnancy, you may be able to continue with normal levels of activity with careful monitoring by your health care team. Follow these instructions at home: If needed, based on your overall health and the health of your baby, your health care provider will decide which type of activity restriction is right for you. Activity restrictions may include:  Not lifting anything heavier than 10 pounds (4.5  kg).  Avoiding activities that take a lot of physical effort.  No lifting or straining.  Resting in a sitting position or lying down for periods of time during the day. Pelvic rest may be recommended along with activity restrictions. If pelvic rest is recommended, then:  Do not have sex, an orgasm, or use sexual stimulation.  Do not use tampons. Do not douche. Do not put anything into your vagina.  Do not lift anything that is heavier than 10 lb (4.5 kg).  Avoid activities that require a lot of effort.  Avoid any activity in which your pelvic muscles could become strained, such as squatting.   Questions to ask your health care provider  Why is my activity being limited?  How will activity restrictions affect my body?  Why is rest helpful for me and my baby?  What activities can I do?  When can I return to normal activities? When should I seek immediate medical care? Seek immediate medical care if you have:  Vaginal bleeding.  Vaginal discharge.  Cramping pain in your lower abdomen.  Regular contractions.  A low, dull backache. Summary  Your health care provider may recommend specific activity restrictions during pregnancy for a variety of reasons.  Activity restriction may require that you limit activities such as exercise, lifting, sex, or any other activity that requires great effort.  Discuss the risks and benefits of activity restriction with your health care team to decide if it is best for you and your baby.  Contact your health care provider right away if you think you are having contractions, or if you notice vaginal bleeding, discharge, or cramping. This information   is not intended to replace advice given to you by your health care provider. Make sure you discuss any questions you have with your health care provider. Document Revised: 11/21/2018 Document Reviewed: 06/20/2017 Elsevier Patient Education  2021 Elsevier Inc. Subchorionic Hematoma  A  hematoma is a collection of blood outside of the blood vessels. A subchorionic hematoma is a collection of blood between the outer wall of the embryo (chorion) and the inner wall of the uterus. This condition can cause vaginal bleeding. Early small hematomas usually shrink on their own and do not affect your baby or pregnancy. When bleeding starts later in pregnancy, or if the hematoma is larger or occurs in older pregnant women, the condition may be more serious. Larger hematomas increase the chances of miscarriage. This condition also increases the risk of:  Premature separation of the placenta from the uterus.  Premature (preterm) labor.  Stillbirth. What are the causes? The exact cause of this condition is not known. It occurs when blood is trapped between the placenta and the uterine wall because the placenta has separated from the original site of implantation. What increases the risk? You are more likely to develop this condition if:  You were treated with fertility medicines.  You became pregnant through in vitro fertilization (IVF). What are the signs or symptoms? Symptoms of this condition include:  Vaginal spotting or bleeding.  Abdominal pain. This is rare. Sometimes you may have no symptoms and the bleeding may only be seen when ultrasound images are taken (transvaginal ultrasound). How is this diagnosed? This condition is diagnosed based on a physical exam. This includes a pelvic exam. You may also have other tests, including:  Blood tests.  Urine tests.  Ultrasound of the abdomen. How is this treated? Treatment for this condition can vary. Treatment may include:  Watchful waiting. You will be monitored closely for any changes in bleeding.  Medicines.  Activity restriction. This may be needed until the bleeding stops.  A medicine called Rh immunoglobulin. This is given if you have an Rh-negative blood type. It prevents Rh sensitization. Follow these instructions  at home:  Stay on bed rest if told to do so by your health care provider.  Do not lift anything that is heavier than 10 lb (4.5 kg), or the limit that you are told by your health care provider.  Track and write down the number of pads you use each day and how soaked (saturated) they are.  Do not use tampons.  Keep all follow-up visits. This is important. Your health care provider may ask you to have follow-up blood tests or ultrasound tests or both. Contact a health care provider if:  You have any vaginal bleeding.  You have a fever. Get help right away if:  You have severe cramps in your stomach, back, abdomen, or pelvis.  You pass large clots or tissue. Save any tissue for your health care provider to look at.  You faint.  You become light-headed or weak. Summary  A subchorionic hematoma is a collection of blood between the outer wall of the embryo (chorion) and the inner wall of the uterus.  This condition can cause vaginal bleeding.  Sometimes you may have no symptoms and the bleeding may only be seen when ultrasound images are taken.  Treatment may include watchful waiting, medicines, or activity restriction.  Keep all follow-up visits. Get help right away if you have severe cramps or heavy vaginal bleeding. This information is not intended to replace advice  given to you by your health care provider. Make sure you discuss any questions you have with your health care provider. Document Revised: 11/25/2019 Document Reviewed: 11/25/2019 Elsevier Patient Education  2021 [employer].

## 2020-05-18 ENCOUNTER — Telehealth: Payer: Self-pay

## 2020-05-18 NOTE — Telephone Encounter (Signed)
Pt voiced concerns about brown spotting in pad and light pink spotting after wiping.  Reports mild intermittent cramping, denies recent IC.  U/S on 05/08/20 reports small subchorionic hemorrhage.  Reassured patient subchorionic hemorrhage may cause spotting.  She may take Extra Strength Tylenol as directed for cramping. No IC until bleeding subsides. If spotting changes to flowing like a period, and/or the pain worsens, please contact the office or report to MAU after business hours.  Keep NOB appointment tomorrow.

## 2020-05-19 ENCOUNTER — Encounter: Payer: Self-pay | Admitting: Obstetrics and Gynecology

## 2020-05-19 ENCOUNTER — Ambulatory Visit (INDEPENDENT_AMBULATORY_CARE_PROVIDER_SITE_OTHER): Payer: Medicaid Other | Admitting: Obstetrics and Gynecology

## 2020-05-19 ENCOUNTER — Other Ambulatory Visit (HOSPITAL_COMMUNITY)
Admission: RE | Admit: 2020-05-19 | Discharge: 2020-05-19 | Disposition: A | Discharge status: Home / Self Care | Payer: Medicaid Other | Source: Ambulatory Visit | Attending: Obstetrics and Gynecology | Admitting: Obstetrics and Gynecology

## 2020-05-19 ENCOUNTER — Other Ambulatory Visit: Payer: Self-pay

## 2020-05-19 DIAGNOSIS — Z3481 Encounter for supervision of other normal pregnancy, first trimester: Secondary | ICD-10-CM

## 2020-05-19 DIAGNOSIS — O98811 Other maternal infectious and parasitic diseases complicating pregnancy, first trimester: Secondary | ICD-10-CM | POA: Diagnosis not present

## 2020-05-19 DIAGNOSIS — O98211 Gonorrhea complicating pregnancy, first trimester: Secondary | ICD-10-CM | POA: Diagnosis not present

## 2020-05-19 DIAGNOSIS — Z3480 Encounter for supervision of other normal pregnancy, unspecified trimester: Secondary | ICD-10-CM | POA: Insufficient documentation

## 2020-05-19 DIAGNOSIS — Z3A11 11 weeks gestation of pregnancy: Secondary | ICD-10-CM | POA: Diagnosis not present

## 2020-05-19 DIAGNOSIS — A749 Chlamydial infection, unspecified: Secondary | ICD-10-CM

## 2020-05-19 MED ORDER — PROMETHAZINE HCL 25 MG PO TABS: 25.0000 mg | ORAL_TABLET | Freq: Four times a day (QID) | ORAL | 2 refills | Status: DC | PRN

## 2020-05-19 MED ORDER — DOXYLAMINE-PYRIDOXINE 10-10 MG PO TBEC: 2.0000 | DELAYED_RELEASE_TABLET | Freq: Every day | ORAL | 5 refills | Status: DC

## 2020-05-19 NOTE — Progress Notes (Signed)
   Subjective:    Tracy [name] is a G3P1011 [redacted]w[redacted]d being seen today for her first obstetrical visit.  Her obstetrical history is significant for previously uncomplicated preganncy. Patient does intend to breast feed. Pregnancy history fully reviewed.  Patient reports vaginal spotting noted when wiping.  Vitals:   05/19/20 1310  BP: 99/67  Pulse: 73  Weight: 139 lb (63 kg)    HISTORY: OB History  Gravida Para Term Preterm AB Living  3 1 1   1 1   SAB IAB Ectopic Multiple Live Births    1   0 1    # Outcome Date GA Lbr Len/2nd Weight Sex Delivery Anes PTL Lv  3 Current           2 IAB 2020          1 Term 07/02/17 [redacted]w[redacted]d 20:00 / 01:49 7 lb 5.5 oz (3.331 kg) F Vag-Spont EPI  LIV   Past Medical History:  Diagnosis Date  . Medical history non-contributory   . Supervision of other normal pregnancy, antepartum 04/06/2020   Past Surgical History:  Procedure Laterality Date  . WISDOM TOOTH EXTRACTION     Family History  Problem Relation Age of Onset  . Thyroid disease Maternal Aunt   . Hypertension Maternal Aunt   . Thyroid disease Paternal Uncle   . Thyroid disease Paternal Grandmother   . Healthy Mother   . Healthy Father      Exam    Uterus:   12-weeks  Pelvic Exam:    Perineum: No Hemorrhoids   Vulva: normal   Vagina:  normal mucosa, normal discharge   pH:    Cervix: multiparous appearance and cervix is closed and long   Adnexa: no mass, fullness, tenderness   Bony Pelvis: gynecoid  System: Breast:  normal appearance, no masses or tenderness   Skin: normal coloration and turgor, no rashes    Neurologic: oriented, no focal deficits   Extremities: normal strength, tone, and muscle mass   HEENT extra ocular movement intact   Mouth/Teeth dental hygiene good   Neck supple and no masses   Cardiovascular: regular rate and rhythm   Respiratory:  appears well, vitals normal, no respiratory distress, acyanotic, normal RR, neck free of mass or lymphadenopathy,  chest clear, no wheezing, crepitations, rhonchi, normal symmetric air entry   Abdomen: soft, non-tender; bowel sounds normal; no masses,  no organomegaly   Urinary:       Assessment:    Pregnancy: 04/08/2020 Patient Active Problem List   Diagnosis Date Noted  . Subchorionic hematoma in first trimester 05/08/2020  . Supervision of other normal pregnancy, antepartum 04/06/2020  . Rubella non-immune status, antepartum 12/20/2016  . Maternal varicella, non-immune 12/20/2016  . Heart murmur previously undiagnosed 10/07/2014        Plan:     Initial labs drawn. Prenatal vitamins. Problem list reviewed and updated. Genetic Screening discussed : panorama ordered.  Ultrasound discussed; fetal survey: ordered.  Follow up in 4 weeks. 50% of 30 min visit spent on counseling and coordination of care.     Tracy [name] 05/19/2020

## 2020-05-19 NOTE — Patient Instructions (Signed)
 Obstetrics: Normal and Problem Pregnancies (7th ed., pp. 102-121). [place], [place]: [employer]."> Textbook of Family Medicine (9th ed., pp. 365-410). [place], [place]: [employer].">  First Trimester of Pregnancy  The first trimester of pregnancy starts on the first day of your last menstrual period until the end of week 12. This is months 1 through 3 of pregnancy. A week after a sperm fertilizes an egg, the egg will implant into the wall of the uterus and begin to develop into a baby. By the end of 12 weeks, all the baby's organs will be formed and the baby will be 2-3 inches in size. Body changes during your first trimester Your body goes through many changes during pregnancy. The changes vary and generally return to normal after your baby is born. Physical changes  You may gain or lose weight.  Your breasts may begin to grow larger and become tender. The tissue that surrounds your nipples (areola) may become darker.  Dark spots or blotches (chloasma or mask of pregnancy) may develop on your face.  You may have changes in your hair. These can include thickening or thinning of your hair or changes in texture. Health changes  You may feel nauseous, and you may vomit.  You may have heartburn.  You may develop headaches.  You may develop constipation.  Your gums may bleed and may be sensitive to brushing and flossing. Other changes  You may tire easily.  You may urinate more often.  Your menstrual periods will stop.  You may have a loss of appetite.  You may develop cravings for certain kinds of food.  You may have changes in your emotions from day to day.  You may have more vivid and strange dreams. Follow these instructions at home: Medicines  Follow your health care provider's instructions regarding medicine use. Specific medicines may be either safe or unsafe to take during pregnancy. Do not take any medicines unless told to by your health care provider.  Take  a prenatal vitamin that contains at least 600 micrograms (mcg) of folic acid. Eating and drinking  Eat a healthy diet that includes fresh fruits and vegetables, whole grains, good sources of protein such as meat, eggs, or tofu, and low-fat dairy products.  Avoid raw meat and unpasteurized juice, milk, and cheese. These carry germs that can harm you and your baby.  If you feel nauseous or you vomit: ? Eat 4 or 5 small meals a day instead of 3 large meals. ? Try eating a few soda crackers. ? Drink liquids between meals instead of during meals.  You may need to take these actions to prevent or treat constipation: ? Drink enough fluid to keep your urine pale yellow. ? Eat foods that are high in fiber, such as beans, whole grains, and fresh fruits and vegetables. ? Limit foods that are high in fat and processed sugars, such as fried or sweet foods. Activity  Exercise only as directed by your health care provider. Most people can continue their usual exercise routine during pregnancy. Try to exercise for 30 minutes at least 5 days a week.  Stop exercising if you develop pain or cramping in the lower abdomen or lower back.  Avoid exercising if it is very hot or humid or if you are at high altitude.  Avoid heavy lifting.  If you choose to, you may have sex unless your health care provider tells you not to. Relieving pain and discomfort  Wear a good support bra to relieve   breast tenderness.  Rest with your legs elevated if you have leg cramps or low back pain.  If you develop bulging veins (varicose veins) in your legs: ? Wear support hose as told by your health care provider. ? Elevate your feet for 15 minutes, 3-4 times a day. ? Limit salt in your diet. Safety  Wear your seat belt at all times when driving or riding in a car.  Talk with your health care provider if someone is verbally or physically abusive to you.  Talk with your health care provider if you are feeling sad or have  thoughts of hurting yourself. Lifestyle  Do not use hot tubs, steam rooms, or saunas.  Do not douche. Do not use tampons or scented sanitary pads.  Do not use herbal remedies, alcohol, illegal drugs, or medicines that are not approved by your health care provider. Chemicals in these products can harm your baby.  Do not use any products that contain nicotine or tobacco, such as cigarettes, e-cigarettes, and chewing tobacco. If you need help quitting, ask your health care provider.  Avoid cat litter boxes and soil used by cats. These carry germs that can cause birth defects in the baby and possibly loss of the unborn baby (fetus) by miscarriage or stillbirth. General instructions  During routine prenatal visits in the first trimester, your health care provider will do a physical exam, perform necessary tests, and ask you how things are going. Keep all follow-up visits. This is important.  Ask for help if you have counseling or nutritional needs during pregnancy. Your health care provider can offer advice or refer you to specialists for help with various needs.  Schedule a dentist appointment. At home, brush your teeth with a soft toothbrush. Floss gently.  Write down your questions. Take them to your prenatal visits. Where to find more information  American Pregnancy Association: americanpregnancy.org  American College of Obstetricians and Gynecologists: acog.org/en/Womens%20Health/Pregnancy  Office on Women's Health: womenshealth.gov/pregnancy Contact a health care provider if you have:  Dizziness.  A fever.  Mild pelvic cramps, pelvic pressure, or nagging pain in the abdominal area.  Nausea, vomiting, or diarrhea that lasts for 24 hours or longer.  A bad-smelling vaginal discharge.  Pain when you urinate.  Known exposure to a contagious illness, such as chickenpox, measles, Zika virus, HIV, or hepatitis. Get help right away if you have:  Spotting or bleeding from your  vagina.  Severe abdominal cramping or pain.  Shortness of breath or chest pain.  Any kind of trauma, such as from a fall or a car crash.  New or increased pain, swelling, or redness in an arm or leg. Summary  The first trimester of pregnancy starts on the first day of your last menstrual period until the end of week 12 (months 1 through 3).  Eating 4 or 5 small meals a day rather than 3 large meals may help to relieve nausea and vomiting.  Do not use any products that contain nicotine or tobacco, such as cigarettes, e-cigarettes, and chewing tobacco. If you need help quitting, ask your health care provider.  Keep all follow-up visits. This is important. This information is not intended to replace advice given to you by your health care provider. Make sure you discuss any questions you have with your health care provider. Document Revised: 08/07/2019 Document Reviewed: 06/13/2019 Else[employer] Patient Education  2021 [employer]   Second Trimester of Pregnancy  The second trimester of pregnancy is from week 13 through week   27. This is months 4 through 6 of pregnancy. The second trimester is often a time when you feel your best. Your body has adjusted to being pregnant, and you begin to feel better physically. During the second trimester:  Morning sickness has lessened or stopped completely.  You may have more energy.  You may have an increase in appetite. The second trimester is also a time when the unborn baby (fetus) is growing rapidly. At the end of the sixth month, the fetus may be up to 12 inches long and weigh about 1 pounds. You will likely begin to feel the baby move (quickening) between 16 and 20 weeks of pregnancy. Body changes during your second trimester Your body continues to go through many changes during your second trimester. The changes vary and generally return to normal after the baby is born. Physical changes  Your weight will continue to increase. You will  notice your lower abdomen bulging out.  You may begin to get stretch marks on your hips, abdomen, and breasts.  Your breasts will continue to grow and to become tender.  Dark spots or blotches (chloasma or mask of pregnancy) may develop on your face.  A dark line from your belly button to the pubic area (linea nigra) may appear.  You may have changes in your hair. These can include thickening of your hair, rapid growth, and changes in texture. Some people also have hair loss during or after pregnancy, or hair that feels dry or thin. Health changes  You may develop headaches.  You may have heartburn.  You may develop constipation.  You may develop hemorrhoids or swollen, bulging veins (varicose veins).  Your gums may bleed and may be sensitive to brushing and flossing.  You may urinate more often because the fetus is pressing on your bladder.  You may have back pain. This is caused by: ? Weight gain. ? Pregnancy hormones that are relaxing the joints in your pelvis. ? A shift in weight and the muscles that support your balance. Follow these instructions at home: Medicines  Follow your health care provider's instructions regarding medicine use. Specific medicines may be either safe or unsafe to take during pregnancy. Do not take any medicines unless approved by your health care provider.  Take a prenatal vitamin that contains at least 600 micrograms (mcg) of folic acid. Eating and drinking  Eat a healthy diet that includes fresh fruits and vegetables, whole grains, good sources of protein such as meat, eggs, or tofu, and low-fat dairy products.  Avoid raw meat and unpasteurized juice, milk, and cheese. These carry germs that can harm you and your baby.  You may need to take these actions to prevent or treat constipation: ? Drink enough fluid to keep your urine pale yellow. ? Eat foods that are high in fiber, such as beans, whole grains, and fresh fruits and  vegetables. ? Limit foods that are high in fat and processed sugars, such as fried or sweet foods. Activity  Exercise only as directed by your health care provider. Most people can continue their usual exercise routine during pregnancy. Try to exercise for 30 minutes at least 5 days a week. Stop exercising if you develop contractions in your uterus.  Stop exercising if you develop pain or cramping in the lower abdomen or lower back.  Avoid exercising if it is very hot or humid or if you are at a high altitude.  Avoid heavy lifting.  If you choose to, you may have   sex unless your health care provider tells you not to. Relieving pain and discomfort  Wear a supportive bra to prevent discomfort from breast tenderness.  Take warm sitz baths to soothe any pain or discomfort caused by hemorrhoids. Use hemorrhoid cream if your health care provider approves.  Rest with your legs raised (elevated) if you have leg cramps or low back pain.  If you develop varicose veins: ? Wear support hose as told by your health care provider. ? Elevate your feet for 15 minutes, 3-4 times a day. ? Limit salt in your diet. Safety  Wear your seat belt at all times when driving or riding in a car.  Talk with your health care provider if someone is verbally or physically abusive to you. Lifestyle  Do not use hot tubs, steam rooms, or saunas.  Do not douche. Do not use tampons or scented sanitary pads.  Avoid cat litter boxes and soil used by cats. These carry germs that can cause birth defects in the baby and possibly loss of the fetus by miscarriage or stillbirth.  Do not use herbal remedies, alcohol, illegal drugs, or medicines that are not approved by your health care provider. Chemicals in these products can harm your baby.  Do not use any products that contain nicotine or tobacco, such as cigarettes, e-cigarettes, and chewing tobacco. If you need help quitting, ask your health care provider. General  instructions  During a routine prenatal visit, your health care provider will do a physical exam and other tests. He or she will also discuss your overall health. Keep all follow-up visits. This is important.  Ask your health care provider for a referral to a local prenatal education class.  Ask for help if you have counseling or nutritional needs during pregnancy. Your health care provider can offer advice or refer you to specialists for help with various needs. Where to find more information  American Pregnancy Association: americanpregnancy.org  American College of Obstetricians and Gynecologists: acog.org/en/Womens%20Health/Pregnancy  Office on Women's Health: womenshealth.gov/pregnancy Contact a health care provider if you have:  A headache that does not go away when you take medicine.  Vision changes or you see spots in front of your eyes.  Mild pelvic cramps, pelvic pressure, or nagging pain in the abdominal area.  Persistent nausea, vomiting, or diarrhea.  A bad-smelling vaginal discharge or foul-smelling urine.  Pain when you urinate.  Sudden or extreme swelling of your face, hands, ankles, feet, or legs.  A fever. Get help right away if you:  Have fluid leaking from your vagina.  Have spotting or bleeding from your vagina.  Have severe abdominal cramping or pain.  Have difficulty breathing.  Have chest pain.  Have fainting spells.  Have not felt your baby move for the time period told by your health care provider.  Have new or increased pain, swelling, or redness in an arm or leg. Summary  The second trimester of pregnancy is from week 13 through week 27 (months 4 through 6).  Do not use herbal remedies, alcohol, illegal drugs, or medicines that are not approved by your health care provider. Chemicals in these products can harm your baby.  Exercise only as directed by your health care provider. Most people can continue their usual exercise routine  during pregnancy.  Keep all follow-up visits. This is important. This information is not intended to replace advice given to you by your health care provider. Make sure you discuss any questions you have with your health care   provider. Document Revised: 08/07/2019 Document Reviewed: 06/13/2019 [employer] Patient Education  2021 [employer]   Contraception Choices Contraception, also called birth control, refers to methods or devices that prevent pregnancy. Hormonal methods Contraceptive implant A contraceptive implant is a thin, plastic tube that contains a hormone that prevents pregnancy. It is different from an intrauterine device (IUD). It is inserted into the upper part of the arm by a health care provider. Implants can be effective for up to 3 years. Progestin-only injections Progestin-only injections are injections of progestin, a synthetic form of the hormone progesterone. They are given every 3 months by a health care provider. Birth control pills Birth control pills are pills that contain hormones that prevent pregnancy. They must be taken once a day, preferably at the same time each day. A prescription is needed to use this method of contraception. Birth control patch The birth control patch contains hormones that prevent pregnancy. It is placed on the skin and must be changed once a week for three weeks and removed on the fourth week. A prescription is needed to use this method of contraception. Vaginal ring A vaginal ring contains hormones that prevent pregnancy. It is placed in the vagina for three weeks and removed on the fourth week. After that, the process is repeated with a new ring. A prescription is needed to use this method of contraception. Emergency contraceptive Emergency contraceptives prevent pregnancy after unprotected sex. They come in pill form and can be taken up to 5 days after sex. They work best the sooner they are taken after having sex. Most emergency  contraceptives are available without a prescription. This method should not be used as your only form of birth control.   Barrier methods Female condom A female condom is a thin sheath that is worn over the penis during sex. Condoms keep sperm from going inside a woman's body. They can be used with a sperm-killing substance (spermicide) to increase their effectiveness. They should be thrown away after one use. Female condom A female condom is a soft, loose-fitting sheath that is put into the vagina before sex. The condom keeps sperm from going inside a woman's body. They should be thrown away after one use. Diaphragm A diaphragm is a soft, dome-shaped barrier. It is inserted into the vagina before sex, along with a spermicide. The diaphragm blocks sperm from entering the uterus, and the spermicide kills sperm. A diaphragm should be left in the vagina for 6-8 hours after sex and removed within 24 hours. A diaphragm is prescribed and fitted by a health care provider. A diaphragm should be replaced every 1-2 years, after giving birth, after gaining more than 15 lb (6.8 kg), and after pelvic surgery. Cervical cap A cervical cap is a round, soft latex or plastic cup that fits over the cervix. It is inserted into the vagina before sex, along with spermicide. It blocks sperm from entering the uterus. The cap should be left in place for 6-8 hours after sex and removed within 48 hours. A cervical cap must be prescribed and fitted by a health care provider. It should be replaced every 2 years. Sponge A sponge is a soft, circular piece of polyurethane foam with spermicide in it. The sponge helps block sperm from entering the uterus, and the spermicide kills sperm. To use it, you make it wet and then insert it into the vagina. It should be inserted before sex, left in for at least 6 hours after sex, and removed and thrown away   within 30 hours. Spermicides Spermicides are chemicals that kill or block sperm from  entering the cervix and uterus. They can come as a cream, jelly, suppository, foam, or tablet. A spermicide should be inserted into the vagina with an applicator at least 10-15 minutes before sex to allow time for it to work. The process must be repeated every time you have sex. Spermicides do not require a prescription.   Intrauterine contraception Intrauterine device (IUD) An IUD is a T-shaped device that is put in a woman's uterus. There are two types:  Hormone IUD.This type contains progestin, a synthetic form of the hormone progesterone. This type can stay in place for 3-5 years.  Copper IUD.This type is wrapped in copper wire. It can stay in place for 10 years. Permanent methods of contraception Female tubal ligation In this method, a woman's fallopian tubes are sealed, tied, or blocked during surgery to prevent eggs from traveling to the uterus. Hysteroscopic sterilization In this method, a small, flexible insert is placed into each fallopian tube. The inserts cause scar tissue to form in the fallopian tubes and block them, so sperm cannot reach an egg. The procedure takes about 3 months to be effective. Another form of birth control must be used during those 3 months. Female sterilization This is a procedure to tie off the tubes that carry sperm (vasectomy). After the procedure, the man can still ejaculate fluid (semen). Another form of birth control must be used for 3 months after the procedure. Natural planning methods Natural family planning In this method, a couple does not have sex on days when the woman could become pregnant. Calendar method In this method, the woman keeps track of the length of each menstrual cycle, identifies the days when pregnancy can happen, and does not have sex on those days. Ovulation method In this method, a couple avoids sex during ovulation. Symptothermal method This method involves not having sex during ovulation. The woman typically checks for  ovulation by watching changes in her temperature and in the consistency of cervical mucus. Post-ovulation method In this method, a couple waits to have sex until after ovulation. Where to find more information  Centers for Disease Control and Prevention: www.cdc.gov Summary  Contraception, also called birth control, refers to methods or devices that prevent pregnancy.  Hormonal methods of contraception include implants, injections, pills, patches, vaginal rings, and emergency contraceptives.  Barrier methods of contraception can include female condoms, female condoms, diaphragms, cervical caps, sponges, and spermicides.  There are two types of IUDs (intrauterine devices). An IUD can be put in a woman's uterus to prevent pregnancy for 3-5 years.  Permanent sterilization can be done through a procedure for males and females. Natural family planning methods involve nothaving sex on days when the woman could become pregnant. This information is not intended to replace advice given to you by your health care provider. Make sure you discuss any questions you have with your health care provider. Document Revised: 08/05/2019 Document Reviewed: 08/05/2019 Elsevier Patient Education  2021 [employer].   Breastfeeding  Choosing to breastfeed is one of the best decisions you can make for yourself and your baby. A change in hormones during pregnancy causes your breasts to make breast milk in your milk-producing glands. Hormones prevent breast milk from being released before your baby is born. They also prompt milk flow after birth. Once breastfeeding has begun, thoughts of your baby, as well as his or her sucking or crying, can stimulate the release of milk   from your milk-producing glands. Benefits of breastfeeding Research shows that breastfeeding offers many health benefits for infants and mothers. It also offers a cost-free and convenient way to feed your baby. For your baby  Your first milk  (colostrum) helps your baby's digestive system to function better.  Special cells in your milk (antibodies) help your baby to fight off infections.  Breastfed babies are less likely to develop asthma, allergies, obesity, or type 2 diabetes. They are also at lower risk for sudden infant death syndrome (SIDS).  Nutrients in breast milk are better able to meet your baby's needs compared to infant formula.  Breast milk improves your baby's brain development. For you  Breastfeeding helps to create a very special bond between you and your baby.  Breastfeeding is convenient. Breast milk costs nothing and is always available at the correct temperature.  Breastfeeding helps to burn calories. It helps you to lose the weight that you gained during pregnancy.  Breastfeeding makes your uterus return faster to its size before pregnancy. It also slows bleeding (lochia) after you give birth.  Breastfeeding helps to lower your risk of developing type 2 diabetes, osteoporosis, rheumatoid arthritis, cardiovascular disease, and breast, ovarian, uterine, and endometrial cancer later in life. Breastfeeding basics Starting breastfeeding  Find a comfortable place to sit or lie down, with your neck and back well-supported.  Place a pillow or a rolled-up blanket under your baby to bring him or her to the level of your breast (if you are seated). Nursing pillows are specially designed to help support your arms and your baby while you breastfeed.  Make sure that your baby's tummy (abdomen) is facing your abdomen.  Gently massage your breast. With your fingertips, massage from the outer edges of your breast inward toward the nipple. This encourages milk flow. If your milk flows slowly, you may need to continue this action during the feeding.  Support your breast with 4 fingers underneath and your thumb above your nipple (make the letter "C" with your hand). Make sure your fingers are well away from your nipple and  your baby's mouth.  Stroke your baby's lips gently with your finger or nipple.  When your baby's mouth is open wide enough, quickly bring your baby to your breast, placing your entire nipple and as much of the areola as possible into your baby's mouth. The areola is the colored area around your nipple. ? More areola should be visible above your baby's upper lip than below the lower lip. ? Your baby's lips should be opened and extended outward (flanged) to ensure an adequate, comfortable latch. ? Your baby's tongue should be between his or her lower gum and your breast.  Make sure that your baby's mouth is correctly positioned around your nipple (latched). Your baby's lips should create a seal on your breast and be turned out (everted).  It is common for your baby to suck about 2-3 minutes in order to start the flow of breast milk. Latching Teaching your baby how to latch onto your breast properly is very important. An improper latch can cause nipple pain, decreased milk supply, and poor weight gain in your baby. Also, if your baby is not latched onto your nipple properly, he or she may swallow some air during feeding. This can make your baby fussy. Burping your baby when you switch breasts during the feeding can help to get rid of the air. However, teaching your baby to latch on properly is still the best way to   prevent fussiness from swallowing air while breastfeeding. Signs that your baby has successfully latched onto your nipple  Silent tugging or silent sucking, without causing you pain. Infant's lips should be extended outward (flanged).  Swallowing heard between every 3-4 sucks once your milk has started to flow (after your let-down milk reflex occurs).  Muscle movement above and in front of his or her ears while sucking. Signs that your baby has not successfully latched onto your nipple  Sucking sounds or smacking sounds from your baby while breastfeeding.  Nipple pain. If you think  your baby has not latched on correctly, slip your finger into the corner of your baby's mouth to break the suction and place it between your baby's gums. Attempt to start breastfeeding again. Signs of successful breastfeeding Signs from your baby  Your baby will gradually decrease the number of sucks or will completely stop sucking.  Your baby will fall asleep.  Your baby's body will relax.  Your baby will retain a small amount of milk in his or her mouth.  Your baby will let go of your breast by himself or herself. Signs from you  Breasts that have increased in firmness, weight, and size 1-3 hours after feeding.  Breasts that are softer immediately after breastfeeding.  Increased milk volume, as well as a change in milk consistency and color by the fifth day of breastfeeding.  Nipples that are not sore, cracked, or bleeding. Signs that your baby is getting enough milk  Wetting at least 1-2 diapers during the first 24 hours after birth.  Wetting at least 5-6 diapers every 24 hours for the first week after birth. The urine should be clear or pale yellow by the age of 5 days.  Wetting 6-8 diapers every 24 hours as your baby continues to grow and develop.  At least 3 stools in a 24-hour period by the age of 5 days. The stool should be soft and yellow.  At least 3 stools in a 24-hour period by the age of 7 days. The stool should be seedy and yellow.  No loss of weight greater than 10% of birth weight during the first 3 days of life.  Average weight gain of 4-7 oz (113-198 g) per week after the age of 4 days.  Consistent daily weight gain by the age of 5 days, without weight loss after the age of 2 weeks. After a feeding, your baby may spit up a small amount of milk. This is normal. Breastfeeding frequency and duration Frequent feeding will help you make more milk and can prevent sore nipples and extremely full breasts (breast engorgement). Breastfeed when you feel the need to  reduce the fullness of your breasts or when your baby shows signs of hunger. This is called "breastfeeding on demand." Signs that your baby is hungry include:  Increased alertness, activity, or restlessness.  Movement of the head from side to side.  Opening of the mouth when the corner of the mouth or cheek is stroked (rooting).  Increased sucking sounds, smacking lips, cooing, sighing, or squeaking.  Hand-to-mouth movements and sucking on fingers or hands.  Fussing or crying. Avoid introducing a pacifier to your baby in the first 4-6 weeks after your baby is born. After this time, you may choose to use a pacifier. Research has shown that pacifier use during the first year of a baby's life decreases the risk of sudden infant death syndrome (SIDS). Allow your baby to feed on each breast as long as   he or she wants. When your baby unlatches or falls asleep while feeding from the first breast, offer the second breast. Because newborns are often sleepy in the first few weeks of life, you may need to awaken your baby to get him or her to feed. Breastfeeding times will vary from baby to baby. However, the following rules can serve as a guide to help you make sure that your baby is properly fed:  Newborns (babies 4 weeks of age or younger) may breastfeed every 1-3 hours.  Newborns should not go without breastfeeding for longer than 3 hours during the day or 5 hours during the night.  You should breastfeed your baby a minimum of 8 times in a 24-hour period. Breast milk pumping Pumping and storing breast milk allows you to make sure that your baby is exclusively fed your breast milk, even at times when you are unable to breastfeed. This is especially important if you go back to work while you are still breastfeeding, or if you are not able to be present during feedings. Your lactation consultant can help you find a method of pumping that works best for you and give you guidelines about how long it is  safe to store breast milk.      Caring for your breasts while you breastfeed Nipples can become dry, cracked, and sore while breastfeeding. The following recommendations can help keep your breasts moisturized and healthy:  Avoid using soap on your nipples.  Wear a supportive bra designed especially for nursing. Avoid wearing underwire-style bras or extremely tight bras (sports bras).  Air-dry your nipples for 3-4 minutes after each feeding.  Use only cotton bra pads to absorb leaked breast milk. Leaking of breast milk between feedings is normal.  Use lanolin on your nipples after breastfeeding. Lanolin helps to maintain your skin's normal moisture barrier. Pure lanolin is not harmful (not toxic) to your baby. You may also hand express a few drops of breast milk and gently massage that milk into your nipples and allow the milk to air-dry. In the first few weeks after giving birth, some women experience breast engorgement. Engorgement can make your breasts feel heavy, warm, and tender to the touch. Engorgement peaks within 3-5 days after you give birth. The following recommendations can help to ease engorgement:  Completely empty your breasts while breastfeeding or pumping. You may want to start by applying warm, moist heat (in the shower or with warm, water-soaked hand towels) just before feeding or pumping. This increases circulation and helps the milk flow. If your baby does not completely empty your breasts while breastfeeding, pump any extra milk after he or she is finished.  Apply ice packs to your breasts immediately after breastfeeding or pumping, unless this is too uncomfortable for you. To do this: ? Put ice in a plastic bag. ? Place a towel between your skin and the bag. ? Leave the ice on for 20 minutes, 2-3 times a day.  Make sure that your baby is latched on and positioned properly while breastfeeding. If engorgement persists after 48 hours of following these recommendations,  contact your health care provider or a lactation consultant. Overall health care recommendations while breastfeeding  Eat 3 healthy meals and 3 snacks every day. Well-nourished mothers who are breastfeeding need an additional 450-500 calories a day. You can meet this requirement by increasing the amount of a balanced diet that you eat.  Drink enough water to keep your urine pale yellow or clear.  Rest   often, relax, and continue to take your prenatal vitamins to prevent fatigue, stress, and low vitamin and mineral levels in your body (nutrient deficiencies).  Do not use any products that contain nicotine or tobacco, such as cigarettes and e-cigarettes. Your baby may be harmed by chemicals from cigarettes that pass into breast milk and exposure to secondhand smoke. If you need help quitting, ask your health care provider.  Avoid alcohol.  Do not use illegal drugs or marijuana.  Talk with your health care provider before taking any medicines. These include over-the-counter and prescription medicines as well as vitamins and herbal supplements. Some medicines that may be harmful to your baby can pass through breast milk.  It is possible to become pregnant while breastfeeding. If birth control is desired, ask your health care provider about options that will be safe while breastfeeding your baby. Where to find more information: [employer]: www.llli.org Contact a health care provider if:  You feel like you want to stop breastfeeding or have become frustrated with breastfeeding.  Your nipples are cracked or bleeding.  Your breasts are red, tender, or warm.  You have: ? Painful breasts or nipples. ? A swollen area on either breast. ? A fever or chills. ? Nausea or vomiting. ? Drainage other than breast milk from your nipples.  Your breasts do not become full before feedings by the fifth day after you give birth.  You feel sad and depressed.  Your baby is: ? Too  sleepy to eat well. ? Having trouble sleeping. ? More than 1 week old and wetting fewer than 6 diapers in a 24-hour period. ? Not gaining weight by 5 days of age.  Your baby has fewer than 3 stools in a 24-hour period.  Your baby's skin or the white parts of his or her eyes become yellow. Get help right away if:  Your baby is overly tired (lethargic) and does not want to wake up and feed.  Your baby develops an unexplained fever. Summary  Breastfeeding offers many health benefits for infant and mothers.  Try to breastfeed your infant when he or she shows early signs of hunger.  Gently tickle or stroke your baby's lips with your finger or nipple to allow the baby to open his or her mouth. Bring the baby to your breast. Make sure that much of the areola is in your baby's mouth. Offer one side and burp the baby before you offer the other side.  Talk with your health care provider or lactation consultant if you have questions or you face problems as you breastfeed. This information is not intended to replace advice given to you by your health care provider. Make sure you discuss any questions you have with your health care provider. Document Revised: 05/25/2017 Document Reviewed: 04/01/2016 Elsevier Patient Education  2021 Elsevier Inc.  

## 2020-05-19 NOTE — Progress Notes (Signed)
Pt states she is still having small amount of bleeding, usually when she uses the restroom.

## 2020-05-20 LAB — CBC/D/PLT+RPR+RH+ABO+RUB AB...
Antibody Screen: NEGATIVE
Basophils Absolute: 0 10*3/uL (ref 0.0–0.2)
Basos: 0 %
EOS (ABSOLUTE): 0.1 10*3/uL (ref 0.0–0.4)
Eos: 1 %
HCV Ab: <0.1 s/co ratio (ref 0.0–0.9)
HIV Screen 4th Generation wRfx: NONREACTIVE
Hematocrit: 34.6 % (ref 34.0–46.6)
Hemoglobin: 11.9 g/dL (ref 11.1–15.9)
Hepatitis B Surface Ag: NEGATIVE
Immature Grans (Abs): 0.1 10*3/uL (ref 0.0–0.1)
Immature Granulocytes: 1 %
Lymphocytes Absolute: 1.9 10*3/uL (ref 0.7–3.1)
Lymphs: 19 %
MCH: 30.8 pg (ref 26.6–33.0)
MCHC: 34.4 g/dL (ref 31.5–35.7)
MCV: 90 fL (ref 79–97)
Monocytes Absolute: 0.6 10*3/uL (ref 0.1–0.9)
Monocytes: 6 %
Neutrophils Absolute: 7.3 10*3/uL — ABNORMAL HIGH (ref 1.4–7.0)
Neutrophils: 73 %
Platelets: 254 10*3/uL (ref 150–450)
RBC: 3.86 x10E6/uL (ref 3.77–5.28)
RDW: 11.8 % (ref 11.7–15.4)
RPR Ser Ql: NONREACTIVE
Rh Factor: POSITIVE
Rubella Antibodies, IGG: 1.41 index (ref >0.99)
WBC: 10 10*3/uL (ref 3.4–10.8)

## 2020-05-20 LAB — CERVICOVAGINAL ANCILLARY ONLY
Chlamydia: POSITIVE — AB
Comment: NEGATIVE
Comment: NORMAL
Neisseria Gonorrhea: POSITIVE — AB

## 2020-05-20 LAB — HCV INTERPRETATION

## 2020-05-21 ENCOUNTER — Other Ambulatory Visit: Payer: Self-pay

## 2020-05-21 ENCOUNTER — Ambulatory Visit (INDEPENDENT_AMBULATORY_CARE_PROVIDER_SITE_OTHER): Payer: Medicaid Other

## 2020-05-21 VITALS — BP 100/62 | HR 66 | Wt 138.0 lb

## 2020-05-21 DIAGNOSIS — O98211 Gonorrhea complicating pregnancy, first trimester: Secondary | ICD-10-CM | POA: Diagnosis not present

## 2020-05-21 DIAGNOSIS — O98219 Gonorrhea complicating pregnancy, unspecified trimester: Secondary | ICD-10-CM | POA: Insufficient documentation

## 2020-05-21 DIAGNOSIS — O98819 Other maternal infectious and parasitic diseases complicating pregnancy, unspecified trimester: Secondary | ICD-10-CM | POA: Insufficient documentation

## 2020-05-21 LAB — URINE CULTURE, OB REFLEX

## 2020-05-21 LAB — CULTURE, OB URINE

## 2020-05-21 MED ORDER — CEFTRIAXONE SODIUM 500 MG IJ SOLR
500.0000 mg | Freq: Once | INTRAMUSCULAR | Status: AC
Start: 2020-05-21 — End: 2020-05-21
  Administered 2020-05-21: 500 mg via INTRAMUSCULAR

## 2020-05-21 MED ORDER — AZITHROMYCIN 500 MG PO TABS: 1000.0000 mg | ORAL_TABLET | Freq: Once | ORAL | 1 refills | Status: AC

## 2020-05-21 MED ORDER — CEFTRIAXONE SODIUM 250 MG IJ SOLR: 500.0000 mg | Freq: Once | INTRAMUSCULAR | Status: DC

## 2020-05-21 NOTE — Progress Notes (Signed)
Administrations This Visit    cefTRIAXone (ROCEPHIN) injection 500 mg    Admin Date 05/21/2020 Action Given Dose 500 mg Route Intramuscular Administered By Maretta Bees, RMA         OB presents for Rocephin Injection for +Gonorrhea, injection given in RUOQ, tolerated well.   TOC 4-6 weeks.  Patient advised to abstain from sexual intercourse until 1 week after her partner has been treated and both parties needs to be rechecked in 4-6 weeks.  Patient verbalized understanding and agreement.

## 2020-05-21 NOTE — Progress Notes (Signed)
Chart reviewed for nurse visit. Agree with plan of care.   Sharyon Cable, CNM 05/21/2020 1:40 PM

## 2020-05-21 NOTE — Addendum Note (Signed)
Addended by: Catalina Antigua on: 05/21/2020 09:07 AM   Modules accepted: Orders

## 2020-05-24 LAB — CYTOLOGY - PAP
Comment: NEGATIVE
Diagnosis: UNDETERMINED — AB
High risk HPV: POSITIVE — AB

## 2020-05-25 ENCOUNTER — Encounter: Payer: Self-pay | Admitting: Obstetrics and Gynecology

## 2020-05-25 DIAGNOSIS — R87610 Atypical squamous cells of undetermined significance on cytologic smear of cervix (ASC-US): Secondary | ICD-10-CM

## 2020-05-25 HISTORY — DX: Atypical squamous cells of undetermined significance on cytologic smear of cervix (ASC-US): R87.610

## 2020-06-01 ENCOUNTER — Encounter: Payer: Self-pay | Admitting: Obstetrics and Gynecology

## 2020-06-16 ENCOUNTER — Encounter: Payer: Medicaid Other | Admitting: Advanced Practice Midwife

## 2020-06-18 ENCOUNTER — Encounter: Payer: Self-pay | Admitting: Obstetrics and Gynecology

## 2020-06-18 ENCOUNTER — Other Ambulatory Visit (HOSPITAL_COMMUNITY)
Admission: RE | Admit: 2020-06-18 | Discharge: 2020-06-18 | Disposition: A | Discharge status: Home / Self Care | Payer: Medicaid Other | Source: Ambulatory Visit | Attending: Obstetrics and Gynecology | Admitting: Obstetrics and Gynecology

## 2020-06-18 ENCOUNTER — Other Ambulatory Visit: Payer: Self-pay

## 2020-06-18 ENCOUNTER — Ambulatory Visit (INDEPENDENT_AMBULATORY_CARE_PROVIDER_SITE_OTHER): Payer: Medicaid Other | Admitting: Obstetrics and Gynecology

## 2020-06-18 VITALS — BP 100/61 | HR 78 | Wt 137.0 lb

## 2020-06-18 DIAGNOSIS — Z3480 Encounter for supervision of other normal pregnancy, unspecified trimester: Secondary | ICD-10-CM

## 2020-06-18 DIAGNOSIS — O98812 Other maternal infectious and parasitic diseases complicating pregnancy, second trimester: Secondary | ICD-10-CM | POA: Insufficient documentation

## 2020-06-18 DIAGNOSIS — A749 Chlamydial infection, unspecified: Secondary | ICD-10-CM | POA: Diagnosis present

## 2020-06-18 DIAGNOSIS — R87810 Cervical high risk human papillomavirus (HPV) DNA test positive: Secondary | ICD-10-CM

## 2020-06-18 DIAGNOSIS — O98212 Gonorrhea complicating pregnancy, second trimester: Secondary | ICD-10-CM | POA: Diagnosis present

## 2020-06-18 DIAGNOSIS — R87610 Atypical squamous cells of undetermined significance on cytologic smear of cervix (ASC-US): Secondary | ICD-10-CM

## 2020-06-18 MED ORDER — POLYMYXIN B-TRIMETHOPRIM 10000-0.1 UNIT/ML-% OP SOLN: 2.0000 [drp] | Freq: Four times a day (QID) | OPHTHALMIC | 0 refills | Status: AC

## 2020-06-18 NOTE — Progress Notes (Signed)
ROB c/o pressure in right eye.

## 2020-06-18 NOTE — Progress Notes (Signed)
   PRENATAL VISIT NOTE  Subjective:  Tracy [name] is a 25 y.o. G3P1011 at [redacted]w[redacted]d being seen today for ongoing prenatal care.  She is currently monitored for the following issues for this low-risk pregnancy and has Heart murmur previously undiagnosed; Maternal varicella, non-immune; Supervision of other normal pregnancy, antepartum; Subchorionic hematoma in first trimester; Gonorrhea affecting pregnancy; Chlamydia infection affecting pregnancy; and ASCUS with positive high risk HPV cervical on their problem list.  Patient reports left eye conjuntivitis since this morning.  Contractions: Not present. Vag. Bleeding: None.   . Denies leaking of fluid.   The following portions of the patient's history were reviewed and updated as appropriate: allergies, current medications, past family history, past medical history, past social history, past surgical history and problem list.   Objective:   Vitals:   06/18/20 0824  BP: 100/61  Pulse: 78  Weight: 137 lb (62.1 kg)    Fetal Status: Fetal Heart Rate (bpm): 156         General:  Alert, oriented and cooperative. Patient is in no acute distress.  Skin: Skin is warm and dry. No rash noted.   Cardiovascular: Normal heart rate noted  Respiratory: Normal respiratory effort, no problems with respiration noted  Abdomen: Soft, gravid, appropriate for gestational age.  Pain/Pressure: Present     Pelvic: Cervical exam deferred        Extremities: Normal range of motion.  Edema: None  Mental Status: Normal mood and affect. Normal behavior. Normal judgment and thought content.   Assessment and Plan:  Pregnancy: G3P1011 at [redacted]w[redacted]d 1. Supervision of other normal pregnancy, antepartum Patient is doing well Antibiotic eye drops prescribed AFP today  2. ASCUS with positive high risk HPV cervical Colpo today  3. Chlamydia infection affecting pregnancy in second trimester TOC today  4. Gonorrhea affecting pregnancy in second trimester TOC  today  Preterm labor symptoms and general obstetric precautions including but not limited to vaginal bleeding, contractions, leaking of fluid and fetal movement were reviewed in detail with the patient. Please refer to After Visit Summary for other counseling recommendations.   Return in about 4 weeks (around 07/16/2020) for in person, ROB, Low risk.  Future Appointments  Date Time Provider Department Center  07/13/2020 10:15 AM [facility]    Catalina Antigua, MD

## 2020-06-18 NOTE — Progress Notes (Signed)
Patient given informed consent, signed copy in the chart, time out was performed.  Placed in lithotomy position. Cervix viewed with speculum and colposcope after application of acetic acid.   Colposcopy adequate?  yes Acetowhite lesions? no Punctation? no Mosaicism?  no Abnormal vasculature?  no Biopsies? no ECC? no  COMMENTS:  Patient was given post procedure instructions.    Catalina Antigua, MD

## 2020-06-19 LAB — CERVICOVAGINAL ANCILLARY ONLY
Chlamydia: NEGATIVE
Comment: NEGATIVE
Comment: NORMAL
Neisseria Gonorrhea: NEGATIVE

## 2020-06-20 LAB — AFP, SERUM, OPEN SPINA BIFIDA
AFP MoM: 1.1
AFP Value: 40.8 ng/mL
Gest. Age on Collection Date: 15.3 weeks
Maternal Age At EDD: 25.8 yr
OSBR Risk 1 IN: 10000
Test Results:: NEGATIVE
Weight: 137 [lb_av]

## 2020-06-24 ENCOUNTER — Encounter: Payer: Self-pay | Admitting: Obstetrics

## 2020-06-24 ENCOUNTER — Other Ambulatory Visit (HOSPITAL_COMMUNITY)
Admission: RE | Admit: 2020-06-24 | Discharge: 2020-06-24 | Disposition: A | Discharge status: Home / Self Care | Payer: Medicaid Other | Source: Ambulatory Visit | Attending: Obstetrics | Admitting: Obstetrics

## 2020-06-24 ENCOUNTER — Ambulatory Visit (INDEPENDENT_AMBULATORY_CARE_PROVIDER_SITE_OTHER): Payer: Medicaid Other | Admitting: Obstetrics

## 2020-06-24 ENCOUNTER — Other Ambulatory Visit: Payer: Self-pay

## 2020-06-24 VITALS — BP 98/49 | HR 67 | Wt 139.0 lb

## 2020-06-24 DIAGNOSIS — N898 Other specified noninflammatory disorders of vagina: Secondary | ICD-10-CM | POA: Insufficient documentation

## 2020-06-24 DIAGNOSIS — Z3480 Encounter for supervision of other normal pregnancy, unspecified trimester: Secondary | ICD-10-CM

## 2020-06-24 DIAGNOSIS — N930 Postcoital and contact bleeding: Secondary | ICD-10-CM

## 2020-06-24 NOTE — Progress Notes (Signed)
Subjective:  Tracy [name] is a 25 y.o. G3P1011 at [redacted]w[redacted]d being seen today for ongoing prenatal care.  She is currently monitored for the following issues for this low-risk pregnancy and has Heart murmur previously undiagnosed; Maternal varicella, non-immune; Supervision of other normal pregnancy, antepartum; Subchorionic hematoma in first trimester; Gonorrhea affecting pregnancy; Chlamydia infection affecting pregnancy; and ASCUS with positive high risk HPV cervical on their problem list.  Patient reports vaginal bleeding after intercourse.  Contractions: Not present. Vag. Bleeding: None.   . Denies leaking of fluid.   The following portions of the patient's history were reviewed and updated as appropriate: allergies, current medications, past family history, past medical history, past social history, past surgical history and problem list. Problem list updated.  Objective:   Vitals:   06/24/20 0923  BP: (!) 98/49  Pulse: 67  Weight: 139 lb (63 kg)    Fetal Status:           General:  Alert, oriented and cooperative. Patient is in no acute distress.  Skin: Skin is warm and dry. No rash noted.   Cardiovascular: Normal heart rate noted  Respiratory: Normal respiratory effort, no problems with respiration noted  Abdomen: Soft, gravid, appropriate for gestational age. Pain/Pressure: Present     Pelvic:  Cervical exam performed      Cvx long/ closed / no active bleeding  Extremities: Normal range of motion.  Edema: None  Mental Status: Normal mood and affect. Normal behavior. Normal judgment and thought content.   Urinalysis:      Assessment and Plan:  Pregnancy: G3P1011 at [redacted]w[redacted]d  1. Supervision of other normal pregnancy, antepartum  2. Postcoital bleeding - no further bleeding noted - recommended abstinence from intercourse until after ultrasound for anatomy.  She agrees.  3. Vaginal discharge -  Cervicovaginal ancillary only [facility]   Preterm labor symptoms and general  obstetric precautions including but not lcervicovaginalimited to vaginal bleeding, contractions, leaking of fluid and fetal movement were reviewed in detail with the patient. Please refer to After Visit Summary for other counseling recommendations.   Return if symptoms worsen or fail to improve.   Brock Bad, MD  06/24/20

## 2020-06-24 NOTE — Progress Notes (Signed)
OB presents for heavy vaginal bleeding yesterday, cramping 7/10 x 2 days.  Cultures done 06/18/20 are Negative.  Denies discharge, odor.

## 2020-06-25 ENCOUNTER — Other Ambulatory Visit: Payer: Self-pay

## 2020-06-25 ENCOUNTER — Telehealth: Payer: Self-pay

## 2020-06-25 DIAGNOSIS — A599 Trichomoniasis, unspecified: Secondary | ICD-10-CM

## 2020-06-25 LAB — CERVICOVAGINAL ANCILLARY ONLY
Bacterial Vaginitis (gardnerella): POSITIVE — AB
Candida Glabrata: NEGATIVE
Candida Vaginitis: NEGATIVE
Comment: NEGATIVE
Comment: NEGATIVE
Comment: NEGATIVE
Comment: NEGATIVE
Trichomonas: POSITIVE — AB

## 2020-06-25 MED ORDER — METRONIDAZOLE 500 MG PO TABS
500.0000 mg | ORAL_TABLET | Freq: Two times a day (BID) | ORAL | 0 refills | Status: DC
Start: 2020-06-25 — End: 2020-07-16

## 2020-06-25 NOTE — Telephone Encounter (Signed)
Pt called and spoke w/ front desk regarding results she saw in Mychart. +TRICH and +BV provider had not reviewed chart just yet. I consulted w/ provider in the office ok to send treatment medication

## 2020-06-25 NOTE — Progress Notes (Signed)
Pt contacted office regarding recent results seen in Mychart Provider had not reviewed just yet. Consulted with provider in office ok to send Treatment Rx Flagyl 500 mg BID for 7 days.

## 2020-06-27 ENCOUNTER — Other Ambulatory Visit: Payer: Self-pay | Admitting: Obstetrics

## 2020-07-13 ENCOUNTER — Other Ambulatory Visit: Payer: Self-pay | Admitting: *Deleted

## 2020-07-13 ENCOUNTER — Ambulatory Visit: Payer: Medicaid Other | Attending: Obstetrics and Gynecology

## 2020-07-13 ENCOUNTER — Other Ambulatory Visit: Payer: Self-pay

## 2020-07-13 DIAGNOSIS — Z3480 Encounter for supervision of other normal pregnancy, unspecified trimester: Secondary | ICD-10-CM | POA: Diagnosis present

## 2020-07-13 DIAGNOSIS — Z362 Encounter for other antenatal screening follow-up: Secondary | ICD-10-CM

## 2020-07-16 ENCOUNTER — Other Ambulatory Visit: Payer: Self-pay

## 2020-07-16 ENCOUNTER — Ambulatory Visit (INDEPENDENT_AMBULATORY_CARE_PROVIDER_SITE_OTHER): Payer: Medicaid Other | Admitting: Nurse Practitioner

## 2020-07-16 VITALS — BP 95/56 | HR 71 | Wt 140.0 lb

## 2020-07-16 DIAGNOSIS — Z3A19 19 weeks gestation of pregnancy: Secondary | ICD-10-CM

## 2020-07-16 DIAGNOSIS — A599 Trichomoniasis, unspecified: Secondary | ICD-10-CM

## 2020-07-16 DIAGNOSIS — Z3480 Encounter for supervision of other normal pregnancy, unspecified trimester: Secondary | ICD-10-CM

## 2020-07-16 DIAGNOSIS — R87810 Cervical high risk human papillomavirus (HPV) DNA test positive: Secondary | ICD-10-CM

## 2020-07-16 DIAGNOSIS — R87610 Atypical squamous cells of undetermined significance on cytologic smear of cervix (ASC-US): Secondary | ICD-10-CM

## 2020-07-16 NOTE — Progress Notes (Signed)
Pt had u/s this week and is scheduled for f/u on anatomy.

## 2020-07-16 NOTE — Progress Notes (Signed)
    Subjective:  Tracy [name] is a 25 y.o. G3P1011 at [redacted]w[redacted]d being seen today for ongoing prenatal care.  She is currently monitored for the following issues for this low-risk pregnancy and has Heart murmur previously undiagnosed; Maternal varicella, non-immune; Supervision of other normal pregnancy, antepartum; Subchorionic hematoma in first trimester; Gonorrhea affecting pregnancy; Chlamydia infection affecting pregnancy; and ASCUS with positive high risk HPV cervical on their problem list.  Patient reports no complaints.  Contractions: Not present. Vag. Bleeding: None.  Movement: Present. Denies leaking of fluid.   The following portions of the patient's history were reviewed and updated as appropriate: allergies, current medications, past family history, past medical history, past social history, past surgical history and problem list. Problem list updated.  Objective:   Vitals:   07/16/20 0930  BP: (!) 95/56  Pulse: 71  Weight: 140 lb (63.5 kg)    Fetal Status: Fetal Heart Rate (bpm): 165 Fundal Height: 20 cm Movement: Present     General:  Alert, oriented and cooperative. Patient is in no acute distress.  Skin: Skin is warm and dry. No rash noted.   Cardiovascular: Normal heart rate noted  Respiratory: Normal respiratory effort, no problems with respiration noted  Abdomen: Soft, gravid, appropriate for gestational age. Pain/Pressure: Absent     Pelvic:  Cervical exam deferred        Extremities: Normal range of motion.  Edema: None  Mental Status: Normal mood and affect. Normal behavior. Normal judgment and thought content.   Urinalysis:      Assessment and Plan:  Pregnancy: G3P1011 at [redacted]w[redacted]d  1. Supervision of other normal pregnancy, antepartum Doing well Has child with her today No questions Reminded of [place] appointment  2. Trichimoniasis Took medication beginning 06-25-20- not quite time for [facility] today - will get at next visit Does not currently had contact with  her partner - unsure if he was treated.  Advised no intercourse with him unless he has been treated to avoid a reinfection  3. ASCUS with positive high risk HPV cervical Had colpo in pregnancy on 05-18-20 Needs colpo again postpartum - client is aware   Preterm labor symptoms and general obstetric precautions including but not limited to vaginal bleeding, contractions, leaking of fluid and fetal movement were reviewed in detail with the patient. Please refer to After Visit Summary for other counseling recommendations.  Return in about 4 weeks (around 08/13/2020) for in person ROB.  Nolene Bernheim, RN, MSN, NP-BC Nurse Practitioner, [facility] for [employer], [facility] 07/16/2020 10:01 AM

## 2020-08-11 ENCOUNTER — Ambulatory Visit: Payer: Medicaid Other | Admitting: *Deleted

## 2020-08-11 ENCOUNTER — Encounter: Payer: Self-pay | Admitting: *Deleted

## 2020-08-11 ENCOUNTER — Ambulatory Visit: Payer: Medicaid Other | Attending: Obstetrics

## 2020-08-11 ENCOUNTER — Other Ambulatory Visit: Payer: Self-pay

## 2020-08-11 DIAGNOSIS — O321XX0 Maternal care for breech presentation, not applicable or unspecified: Secondary | ICD-10-CM | POA: Diagnosis not present

## 2020-08-11 DIAGNOSIS — Z3480 Encounter for supervision of other normal pregnancy, unspecified trimester: Secondary | ICD-10-CM | POA: Diagnosis present

## 2020-08-11 DIAGNOSIS — O98313 Other infections with a predominantly sexual mode of transmission complicating pregnancy, third trimester: Secondary | ICD-10-CM

## 2020-08-11 DIAGNOSIS — A599 Trichomoniasis, unspecified: Secondary | ICD-10-CM

## 2020-08-11 DIAGNOSIS — Z362 Encounter for other antenatal screening follow-up: Secondary | ICD-10-CM | POA: Diagnosis present

## 2020-08-11 DIAGNOSIS — Z3A23 23 weeks gestation of pregnancy: Secondary | ICD-10-CM

## 2020-08-13 ENCOUNTER — Other Ambulatory Visit: Payer: Self-pay

## 2020-08-13 ENCOUNTER — Other Ambulatory Visit (HOSPITAL_COMMUNITY)
Admission: RE | Admit: 2020-08-13 | Discharge: 2020-08-13 | Disposition: A | Discharge status: Home / Self Care | Payer: Medicaid Other | Source: Ambulatory Visit | Attending: Advanced Practice Midwife | Admitting: Advanced Practice Midwife

## 2020-08-13 ENCOUNTER — Ambulatory Visit (INDEPENDENT_AMBULATORY_CARE_PROVIDER_SITE_OTHER): Payer: Medicaid Other | Admitting: Advanced Practice Midwife

## 2020-08-13 VITALS — BP 108/63 | HR 78 | Wt 145.2 lb

## 2020-08-13 DIAGNOSIS — O99891 Other specified diseases and conditions complicating pregnancy: Secondary | ICD-10-CM

## 2020-08-13 DIAGNOSIS — O23592 Infection of other part of genital tract in pregnancy, second trimester: Secondary | ICD-10-CM | POA: Diagnosis not present

## 2020-08-13 DIAGNOSIS — Z3480 Encounter for supervision of other normal pregnancy, unspecified trimester: Secondary | ICD-10-CM

## 2020-08-13 DIAGNOSIS — M549 Dorsalgia, unspecified: Secondary | ICD-10-CM

## 2020-08-13 DIAGNOSIS — A5901 Trichomonal vulvovaginitis: Secondary | ICD-10-CM | POA: Diagnosis present

## 2020-08-13 DIAGNOSIS — Z3A23 23 weeks gestation of pregnancy: Secondary | ICD-10-CM

## 2020-08-13 HISTORY — DX: Trichomonal vulvovaginitis: A59.01

## 2020-08-13 HISTORY — DX: Infection of other part of genital tract in pregnancy, second trimester: O23.592

## 2020-08-13 NOTE — Progress Notes (Signed)
   PRENATAL VISIT NOTE  Subjective:  Tracy [name] is a 25 y.o. G3P1011 at [redacted]w[redacted]d being seen today for ongoing prenatal care.  She is currently monitored for the following issues for this low-risk pregnancy and has Heart murmur previously undiagnosed; Maternal varicella, non-immune; Supervision of other normal pregnancy, antepartum; Subchorionic hematoma in first trimester; Gonorrhea affecting pregnancy; Chlamydia infection affecting pregnancy; and ASCUS with positive high risk HPV cervical on their problem list.  Patient reports constant back pain.  Contractions: Not present. Vag. Bleeding: None.  Movement: Present. Denies leaking of fluid.   The following portions of the patient's history were reviewed and updated as appropriate: allergies, current medications, past family history, past medical history, past social history, past surgical history and problem list.   Objective:   Vitals:   08/13/20 1024  BP: 108/63  Pulse: 78  Weight: 145 lb 3.2 oz (65.9 kg)    Fetal Status: Fetal Heart Rate (bpm): 161 Fundal Height: 24 cm Movement: Present     General:  Alert, oriented and cooperative. Patient is in no acute distress.  Skin: Skin is warm and dry. No rash noted.   Cardiovascular: Normal heart rate noted  Respiratory: Normal respiratory effort, no problems with respiration noted  Abdomen: Soft, gravid, appropriate for gestational age.  Pain/Pressure: Present     Pelvic: Cervical exam deferred        Extremities: Normal range of motion.  Edema: None  Mental Status: Normal mood and affect. Normal behavior. Normal judgment and thought content.   Assessment and Plan:  Pregnancy: G3P1011 at [redacted]w[redacted]d 1. Supervision of other normal pregnancy, antepartum --Anticipatory guidance about next visits/weeks of pregnancy given. --Next visit in 4 weeks for GTT  2. [redacted] weeks gestation of pregnancy   3. Trichomonal vaginitis during pregnancy, antepartum, second trimester --Pt treated 4/13, was  with partner with condom initially but reports no recent intercourse with partner due to concerns about infection.  - Cervicovaginal ancillary only( [place])  4. Back pain affecting pregnancy in second trimester --Rest/ice/heat/warm bath/increase PO fluids/Tylenol/pregnancy support belt --Pt to leave urine sample today --PTL precautions reviewed - Culture, OB Urine  Preterm labor symptoms and general obstetric precautions including but not limited to vaginal bleeding, contractions, leaking of fluid and fetal movement were reviewed in detail with the patient. Please refer to After Visit Summary for other counseling recommendations.   Return in about 4 weeks (around 09/10/2020).  Future Appointments  Date Time Provider Department Center  09/10/2020  9:00 AM [facility] None  09/10/2020 10:45 AM Conan Bowens, MD [facility] None    Sharen Counter, CNM

## 2020-08-13 NOTE — Progress Notes (Signed)
Pt reports fetal movement and complains of back pain, just started wearing maternity belt today.

## 2020-08-13 NOTE — Patient Instructions (Signed)

## 2020-08-14 ENCOUNTER — Other Ambulatory Visit: Payer: Self-pay | Admitting: Advanced Practice Midwife

## 2020-08-14 DIAGNOSIS — O23592 Infection of other part of genital tract in pregnancy, second trimester: Secondary | ICD-10-CM | POA: Insufficient documentation

## 2020-08-14 DIAGNOSIS — A749 Chlamydial infection, unspecified: Secondary | ICD-10-CM

## 2020-08-14 DIAGNOSIS — O98211 Gonorrhea complicating pregnancy, first trimester: Secondary | ICD-10-CM

## 2020-08-14 DIAGNOSIS — O98811 Other maternal infectious and parasitic diseases complicating pregnancy, first trimester: Secondary | ICD-10-CM

## 2020-08-14 DIAGNOSIS — A5901 Trichomonal vulvovaginitis: Secondary | ICD-10-CM

## 2020-08-14 LAB — CERVICOVAGINAL ANCILLARY ONLY
Chlamydia: NEGATIVE
Comment: NEGATIVE
Comment: NEGATIVE
Comment: NORMAL
Neisseria Gonorrhea: NEGATIVE
Trichomonas: POSITIVE — AB

## 2020-08-15 LAB — URINE CULTURE, OB REFLEX

## 2020-08-15 LAB — CULTURE, OB URINE

## 2020-08-17 ENCOUNTER — Other Ambulatory Visit: Payer: Self-pay | Admitting: Advanced Practice Midwife

## 2020-08-17 DIAGNOSIS — A5901 Trichomonal vulvovaginitis: Secondary | ICD-10-CM

## 2020-08-17 DIAGNOSIS — O23592 Infection of other part of genital tract in pregnancy, second trimester: Secondary | ICD-10-CM

## 2020-08-17 MED ORDER — METRONIDAZOLE 500 MG PO TABS: ORAL_TABLET | ORAL | 1 refills | Status: DC

## 2020-08-22 ENCOUNTER — Inpatient Hospital Stay (HOSPITAL_COMMUNITY)
Admission: AD | Admit: 2020-08-22 | Discharge: 2020-08-22 | Disposition: A | Discharge status: Home / Self Care | Payer: Medicaid Other | Attending: Obstetrics and Gynecology | Admitting: Obstetrics and Gynecology

## 2020-08-22 ENCOUNTER — Other Ambulatory Visit: Payer: Self-pay

## 2020-08-22 ENCOUNTER — Encounter (HOSPITAL_COMMUNITY): Payer: Self-pay | Admitting: Obstetrics and Gynecology

## 2020-08-22 DIAGNOSIS — M549 Dorsalgia, unspecified: Secondary | ICD-10-CM

## 2020-08-22 DIAGNOSIS — N898 Other specified noninflammatory disorders of vagina: Secondary | ICD-10-CM | POA: Diagnosis not present

## 2020-08-22 DIAGNOSIS — Z0371 Encounter for suspected problem with amniotic cavity and membrane ruled out: Secondary | ICD-10-CM | POA: Diagnosis not present

## 2020-08-22 DIAGNOSIS — O99892 Other specified diseases and conditions complicating childbirth: Secondary | ICD-10-CM

## 2020-08-22 DIAGNOSIS — O26892 Other specified pregnancy related conditions, second trimester: Secondary | ICD-10-CM | POA: Insufficient documentation

## 2020-08-22 DIAGNOSIS — O99891 Other specified diseases and conditions complicating pregnancy: Secondary | ICD-10-CM

## 2020-08-22 DIAGNOSIS — Z3A24 24 weeks gestation of pregnancy: Secondary | ICD-10-CM | POA: Insufficient documentation

## 2020-08-22 DIAGNOSIS — Z87891 Personal history of nicotine dependence: Secondary | ICD-10-CM | POA: Diagnosis not present

## 2020-08-22 LAB — URINALYSIS, ROUTINE W REFLEX MICROSCOPIC
Bilirubin Urine: NEGATIVE
Glucose, UA: NEGATIVE mg/dL
Hgb urine dipstick: NEGATIVE
Ketones, ur: NEGATIVE mg/dL
Nitrite: NEGATIVE
Protein, ur: NEGATIVE mg/dL
Specific Gravity, Urine: 1.028 (ref 1.005–1.030)
pH: 6 (ref 5.0–8.0)

## 2020-08-22 LAB — POCT FERN TEST: POCT Fern Test: NEGATIVE

## 2020-08-22 MED ORDER — ACETAMINOPHEN 500 MG PO TABS
1000.0000 mg | ORAL_TABLET | Freq: Once | ORAL | Status: AC
  Administered 2020-08-22: 14:00:00 1000 mg via ORAL
  Filled 2020-08-22: qty 2

## 2020-08-22 MED ORDER — CYCLOBENZAPRINE HCL 5 MG PO TABS: 5.0000 mg | ORAL_TABLET | Freq: Three times a day (TID) | ORAL | 0 refills | Status: DC | PRN

## 2020-08-22 MED ORDER — CYCLOBENZAPRINE HCL 5 MG PO TABS
10.0000 mg | ORAL_TABLET | Freq: Once | ORAL | Status: AC
  Administered 2020-08-22: 14:00:00 10 mg via ORAL
  Filled 2020-08-22: qty 2

## 2020-08-22 NOTE — MAU Note (Signed)
Tracy [name] is a 25 y.o. at [redacted]w[redacted]d here in MAU reporting: last night when she farted she noticed some dripping of fluid. Fluid was clear and she is not leaking anymore today. Today is having really bad back pain. No bleeding. +FM  Onset of complaint: last night  Pain score: 8/10  Vitals:   08/22/20 1239  BP: 113/62  Pulse: 76  Resp: 16  Temp: 98 F (36.7 C)  SpO2: 99%     FHT:168  Lab orders placed from triage: UA

## 2020-08-22 NOTE — MAU Provider Note (Signed)
History     CSN: [id number]  Arrival date and time: 08/22/20 1214   Event Date/Time   First Provider Initiated Contact with Patient 08/22/20 1314      Chief Complaint  Patient presents with   Back Pain   Rupture of Membranes   HPI Tracy [name] is a 25 y.o. G3P1011 at 30w5dwho presents with back pain & leaking fluid.  Reports constant low back pain for the last 2 weeks that is worse with walking. Rates pain 8/10. Has been using maternity support belt & tylenol without relief. Denies n/v/d, dysuria, hematuria, vaginal bleeding, or abdominal pain.  Reports 1 episode of clear watery discharge last night. Leaking has not continued. Was diagnosed & treated for trichomonas on 6/2. Has not had intercourse since then.   OB History     Gravida  3   Para  1   Term  1   Preterm      AB  1   Living  1      SAB      IAB  1   Ectopic      Multiple  0   Live Births  1           Past Medical History:  Diagnosis Date   ASCUS with positive high risk HPV cervical 05/25/2020   Trichomonal vaginitis during pregnancy in second trimester 08/13/2020    Past Surgical History:  Procedure Laterality Date   WISDOM TOOTH EXTRACTION      Family History  Problem Relation Age of Onset   Thyroid disease Maternal Aunt    Hypertension Maternal Aunt    Thyroid disease Paternal Uncle    Thyroid disease Paternal Grandmother    Healthy Mother    Healthy Father     Social History   Tobacco Use   Smoking status: Former    Pack years: 0.00   Smokeless tobacco: Never   Tobacco comments:    last used when confirmed pregnancy  Vaping Use   Vaping Use: Never used  Substance Use Topics   Alcohol use: No    Alcohol/week: 0.0 standard drinks   Drug use: No    Types: Marijuana    Comment: last used a year ago    Allergies: No Known Allergies  Facility-Administered Medications Prior to Admission  Medication Dose Route Frequency Provider Last Rate Last Admin    cefTRIAXone (ROCEPHIN) injection 500 mg  500 mg Intramuscular Once Constant, Peggy, MD       Medications Prior to Admission  Medication Sig Dispense Refill Last Dose   Prenatal Vit-Fe Fumarate-FA (PREPLUS) 27-1 MG TABS Take 1 tablet by mouth daily. 30 tablet 13 08/22/2020   Blood Pressure Monitoring (BLOOD PRESSURE KIT) DEVI Check BP readings at home regularly Large cuff z34.80 1 each 0    Doxylamine-Pyridoxine (DICLEGIS) 10-10 MG TBEC Take 2 tablets by mouth at bedtime. If symptoms persist, add one tablet in the morning and one in the afternoon (Patient not taking: No sig reported) 100 tablet 5    metroNIDAZOLE (FLAGYL) 500 MG tablet Take two tablets by mouth twice a day, for one day.  Or you can take all four tablets at once if you can tolerate it. 4 tablet 1    Misc. Devices (GOJJI WEIGHT SCALE) MISC 1 Device by Does not apply route as needed. 1 each 0    promethazine (PHENERGAN) 25 MG tablet Take 1 tablet (25 mg total) by mouth every 6 (six) hours as needed for  nausea or vomiting. 30 tablet 2     Review of Systems  Constitutional: Negative.   Gastrointestinal: Negative.   Genitourinary:  Positive for vaginal discharge. Negative for dysuria and vaginal bleeding.  Musculoskeletal:  Positive for back pain.  Physical Exam   Blood pressure 109/60, pulse 68, temperature 97.6 F (36.4 C), temperature source Oral, resp. rate 14, height '5\' 3"'  (1.6 m), weight 66.7 kg, last menstrual period 02/02/2020, SpO2 100 %.  Physical Exam Vitals and nursing note reviewed.  Constitutional:      General: She is not in acute distress.    Appearance: Normal appearance. She is normal weight. She is not ill-appearing.  HENT:     Head: Normocephalic and atraumatic.  Eyes:     General: No scleral icterus. Pulmonary:     Effort: Pulmonary effort is normal. No respiratory distress.  Abdominal:     Palpations: Abdomen is soft.     Tenderness: There is no abdominal tenderness.     Comments: Gravid uterus   Genitourinary:    Comments: Sterile speculum performed. Cervix visually closed. No abnormal vaginal discharge. No pooling of fluid.  Skin:    General: Skin is warm and dry.  Neurological:     Mental Status: She is alert.  Psychiatric:        Mood and Affect: Mood normal.        Behavior: Behavior normal.   NST:  Baseline: 160 bpm, Variability: Good {> 6 bpm), Accelerations: Non-reactive but appropriate for gestational age, and Decelerations: Absent  MAU Course  Procedures Results for orders placed or performed during the hospital encounter of 08/22/20 (from the past 24 hour(s))  Urinalysis, Routine w reflex microscopic Urine, Clean Catch     Status: Abnormal   Collection Time: 08/22/20 12:37 PM  Result Value Ref Range   Color, Urine YELLOW YELLOW   APPearance HAZY (A) CLEAR   Specific Gravity, Urine 1.028 1.005 - 1.030   pH 6.0 5.0 - 8.0   Glucose, UA NEGATIVE NEGATIVE mg/dL   Hgb urine dipstick NEGATIVE NEGATIVE   Bilirubin Urine NEGATIVE NEGATIVE   Ketones, ur NEGATIVE NEGATIVE mg/dL   Protein, ur NEGATIVE NEGATIVE mg/dL   Nitrite NEGATIVE NEGATIVE   Leukocytes,Ua LARGE (A) NEGATIVE   RBC / HPF 0-5 0 - 5 RBC/hpf   WBC, UA 11-20 0 - 5 WBC/hpf   Bacteria, UA RARE (A) NONE SEEN   Squamous Epithelial / LPF 6-10 0 - 5   Mucus PRESENT   POCT fern test     Status: Normal   Collection Time: 08/22/20  1:50 PM  Result Value Ref Range   POCT Fern Test Negative = intact amniotic membranes     MDM Patient presents for possible leaking fluids. SSE performed - no pooling of fluid & fern negative. Recently treated for trichomonas - GC/CT negative at that time - too soon for TOC.   Low back pain x 2 weeks. Has been using maternity support belt & using tylenol sporadically without relief. Hasn't taken tylenol in the last few day. No urinary complaints. U/a with large leuks - will send for urine culture.  Tylenol & flexeril given in MAU today with some relief - pt would like some  flexeril for home  Assessment and Plan   1. Encounter for suspected PROM, with rupture of membranes not found   2. Back pain affecting pregnancy in second trimester   3. [redacted] weeks gestation of pregnancy    -Rx flexeril -continue maternity support belt &  tylenol prn -urine culture pending -reviewed reasons to return to [place] 08/22/2020, 2:55 PM

## 2020-08-23 LAB — CULTURE, OB URINE
Culture: 50000 — AB
Special Requests: NORMAL

## 2020-09-10 ENCOUNTER — Other Ambulatory Visit: Payer: Self-pay

## 2020-09-10 ENCOUNTER — Ambulatory Visit (INDEPENDENT_AMBULATORY_CARE_PROVIDER_SITE_OTHER): Payer: Medicaid Other | Admitting: Obstetrics and Gynecology

## 2020-09-10 ENCOUNTER — Other Ambulatory Visit (HOSPITAL_COMMUNITY)
Admission: RE | Admit: 2020-09-10 | Discharge: 2020-09-10 | Disposition: A | Discharge status: Home / Self Care | Payer: Medicaid Other | Source: Ambulatory Visit | Attending: Obstetrics and Gynecology | Admitting: Obstetrics and Gynecology

## 2020-09-10 ENCOUNTER — Encounter: Payer: Self-pay | Admitting: Obstetrics and Gynecology

## 2020-09-10 ENCOUNTER — Other Ambulatory Visit: Payer: Medicaid Other

## 2020-09-10 VITALS — BP 109/68 | HR 77 | Wt 152.0 lb

## 2020-09-10 DIAGNOSIS — R87610 Atypical squamous cells of undetermined significance on cytologic smear of cervix (ASC-US): Secondary | ICD-10-CM

## 2020-09-10 DIAGNOSIS — A5901 Trichomonal vulvovaginitis: Secondary | ICD-10-CM

## 2020-09-10 DIAGNOSIS — Z23 Encounter for immunization: Secondary | ICD-10-CM

## 2020-09-10 DIAGNOSIS — O23592 Infection of other part of genital tract in pregnancy, second trimester: Secondary | ICD-10-CM | POA: Diagnosis present

## 2020-09-10 DIAGNOSIS — A749 Chlamydial infection, unspecified: Secondary | ICD-10-CM

## 2020-09-10 DIAGNOSIS — O98219 Gonorrhea complicating pregnancy, unspecified trimester: Secondary | ICD-10-CM

## 2020-09-10 DIAGNOSIS — O98811 Other maternal infectious and parasitic diseases complicating pregnancy, first trimester: Secondary | ICD-10-CM

## 2020-09-10 DIAGNOSIS — Z3A27 27 weeks gestation of pregnancy: Secondary | ICD-10-CM

## 2020-09-10 DIAGNOSIS — R87810 Cervical high risk human papillomavirus (HPV) DNA test positive: Secondary | ICD-10-CM

## 2020-09-10 DIAGNOSIS — Z3480 Encounter for supervision of other normal pregnancy, unspecified trimester: Secondary | ICD-10-CM

## 2020-09-10 NOTE — Progress Notes (Signed)
   PRENATAL VISIT NOTE  Subjective:  Tracy [name] is a 25 y.o. G3P1011 at [redacted]w[redacted]d being seen today for ongoing prenatal care.  She is currently monitored for the following issues for this low-risk pregnancy and has Heart murmur previously undiagnosed; Maternal varicella, non-immune; Supervision of other normal pregnancy, antepartum; Subchorionic hematoma in first trimester; Gonorrhea affecting pregnancy; Chlamydia infection affecting pregnancy; ASCUS with positive high risk HPV cervical; and Trichomonal vaginitis during pregnancy, antepartum, second trimester on their problem list.  Patient reports no complaints.  Contractions: Not present. Vag. Bleeding: None.  Movement: Present. Denies leaking of fluid.   The following portions of the patient's history were reviewed and updated as appropriate: allergies, current medications, past family history, past medical history, past social history, past surgical history and problem list.   Objective:   Vitals:   09/10/20 0842  BP: 109/68  Pulse: 77  Weight: 152 lb (68.9 kg)    Fetal Status: Fetal Heart Rate (bpm): 155   Movement: Present     General:  Alert, oriented and cooperative. Patient is in no acute distress.  Skin: Skin is warm and dry. No rash noted.   Cardiovascular: Normal heart rate noted  Respiratory: Normal respiratory effort, no problems with respiration noted  Abdomen: Soft, gravid, appropriate for gestational age.  Pain/Pressure: Absent     Pelvic: Cervical exam deferred        Extremities: Normal range of motion.     Mental Status: Normal mood and affect. Normal behavior. Normal judgment and thought content.   Assessment and Plan:  Pregnancy: G3P1011 at [redacted]w[redacted]d 1. Gonorrhea affecting pregnancy, antepartum Neg TOC  2. Supervision of other normal pregnancy, antepartum Had extensive conversation about contraception options, expressed an interest in BTL but declines to sign papers today, interested in LARC, reviewed info  and gave information - Glucose Tolerance, 2 Hours w/1 Hour - CBC - HIV Antibody (routine testing w rflx) - RPR - Tdap  3. Chlamydia infection affecting pregnancy in first trimester Neg TOC  4. Trichomonal vaginitis during pregnancy, antepartum, second trimester TOC today - Cervicovaginal ancillary only  5. [redacted] weeks gestation of pregnancy  6. ASCUS with positive high risk HPV cervical [facility] pp  Preterm labor symptoms and general obstetric precautions including but not limited to vaginal bleeding, contractions, leaking of fluid and fetal movement were reviewed in detail with the patient. Please refer to After Visit Summary for other counseling recommendations.   Return in about 2 weeks (around 09/24/2020) for low OB.  Future Appointments  Date Time Provider Department Center  09/10/2020 10:45 AM Conan Bowens, MD [facility] None    Conan Bowens, MD

## 2020-09-10 NOTE — Patient Instructions (Signed)
Go to [email or web address] for more information!  Contraception Choices Contraception, also called birth control, refers to methods or devices that prevent pregnancy. Hormonal methods Contraceptive implant A contraceptive implant is a thin, plastic tube that contains a hormone. It is inserted into the upper part of the arm. It can remain in place for up to 3 years. Progestin-only injections Progestin-only injections are injections of progestin, a synthetic form of the hormone progesterone. They are given every 3 months by a health care provider. Birth control pills Birth control pills are pills that contain hormones that prevent pregnancy. They must be taken once a day, preferably at the same time each day. Birth control patch The birth control patch contains hormones that prevent pregnancy. It is placed on the skin and must be changed once a week for three weeks and removed on the fourth week. A prescription is needed to use this method of contraception. Vaginal ring A vaginal ring contains hormones that prevent pregnancy. It is placed in the vagina for three weeks and removed on the fourth week. After that, the process is repeated with a new ring. A prescription is needed to use this method of contraception. Emergency contraceptive Emergency contraceptives prevent pregnancy after unprotected sex. They come in pill form and can be taken up to 5 days after sex. They work best the sooner they are taken after having sex. Most emergency contraceptives are available without a prescription. This method should not be used as your only form of birth control. Barrier methods Female condom A female condom is a thin sheath that is worn over the penis during sex. Condoms keep sperm from going inside a woman's body. They can be used with a spermicide to increase their effectiveness. They should be disposed after a single use. Female condom A female condom is a soft, loose-fitting sheath that is put into the vagina  before sex. The condom keeps sperm from going inside a woman's body. They should be disposed after a single use.  Intrauterine contraception Intrauterine device (IUD) An IUD is a T-shaped device that is put in a woman's uterus. There are two types:  Hormone IUD.This type contains progestin, a synthetic form of the hormone progesterone. This type can stay in place for 3-5 years.  Copper IUD.This type is wrapped in copper wire. It can stay in place for 10 years.  Permanent methods of contraception Female tubal ligation In this method, a woman's fallopian tubes are sealed, tied, or blocked during surgery to prevent eggs from traveling to the uterus.  Female sterilization This is a procedure to tie off the tubes that carry sperm (vasectomy). After the procedure, the man can still ejaculate fluid (semen).  Summary  Contraception, also called birth control, means methods or devices that prevent pregnancy.  Hormonal methods of contraception include implants, injections, pills, patches, vaginal rings, and emergency contraceptives.  Barrier methods of contraception can include female condoms, female condoms, diaphragms, cervical caps, sponges, and spermicides.  There are two types of IUDs (intrauterine devices). An IUD can be put in a woman's uterus to prevent pregnancy for 3-5 years.  Permanent sterilization can be done through a procedure for males, females, or both. This information is not intended to replace advice given to you by your health care provider. Make sure you discuss any questions you have with your health care provider. Document Released: 02/28/2005 Document Revised: 04/02/2016 Document Reviewed: 04/02/2016 Elsevier Interactive Patient Education  2018 Elsevier Inc.  

## 2020-09-11 ENCOUNTER — Other Ambulatory Visit: Payer: Self-pay | Admitting: Family Medicine

## 2020-09-11 LAB — HIV ANTIBODY (ROUTINE TESTING W REFLEX): HIV Screen 4th Generation wRfx: NONREACTIVE

## 2020-09-11 LAB — CERVICOVAGINAL ANCILLARY ONLY
Bacterial Vaginitis (gardnerella): NEGATIVE
Candida Glabrata: NEGATIVE
Candida Vaginitis: POSITIVE — AB
Comment: NEGATIVE
Comment: NEGATIVE
Comment: NEGATIVE
Comment: NEGATIVE
Trichomonas: POSITIVE — AB

## 2020-09-11 LAB — CBC
Hematocrit: 31.1 % — ABNORMAL LOW (ref 34.0–46.6)
Hemoglobin: 10.2 g/dL — ABNORMAL LOW (ref 11.1–15.9)
MCH: 30.8 pg (ref 26.6–33.0)
MCHC: 32.8 g/dL (ref 31.5–35.7)
MCV: 94 fL (ref 79–97)
Platelets: 178 10*3/uL (ref 150–450)
RBC: 3.31 x10E6/uL — ABNORMAL LOW (ref 3.77–5.28)
RDW: 11.9 % (ref 11.7–15.4)
WBC: 7.7 10*3/uL (ref 3.4–10.8)

## 2020-09-11 LAB — GLUCOSE TOLERANCE, 2 HOURS W/ 1HR
Glucose, 1 hour: 101 mg/dL (ref 65–179)
Glucose, 2 hour: 78 mg/dL (ref 65–152)
Glucose, Fasting: 80 mg/dL (ref 65–91)

## 2020-09-11 LAB — RPR: RPR Ser Ql: NONREACTIVE

## 2020-09-11 MED ORDER — METRONIDAZOLE 500 MG PO TABS: 500.0000 mg | ORAL_TABLET | Freq: Two times a day (BID) | ORAL | 0 refills | Status: DC

## 2020-09-15 ENCOUNTER — Encounter (HOSPITAL_COMMUNITY): Payer: Self-pay | Admitting: Obstetrics and Gynecology

## 2020-09-15 ENCOUNTER — Inpatient Hospital Stay (HOSPITAL_COMMUNITY)
Admission: AD | Admit: 2020-09-15 | Discharge: 2020-09-15 | Disposition: A | Discharge status: Home / Self Care | Payer: Medicaid Other | Attending: Obstetrics and Gynecology | Admitting: Obstetrics and Gynecology

## 2020-09-15 ENCOUNTER — Other Ambulatory Visit: Payer: Self-pay

## 2020-09-15 DIAGNOSIS — A5901 Trichomonal vulvovaginitis: Secondary | ICD-10-CM | POA: Diagnosis not present

## 2020-09-15 DIAGNOSIS — Z3A28 28 weeks gestation of pregnancy: Secondary | ICD-10-CM | POA: Diagnosis not present

## 2020-09-15 DIAGNOSIS — Z3483 Encounter for supervision of other normal pregnancy, third trimester: Secondary | ICD-10-CM

## 2020-09-15 DIAGNOSIS — R350 Frequency of micturition: Secondary | ICD-10-CM | POA: Insufficient documentation

## 2020-09-15 DIAGNOSIS — N898 Other specified noninflammatory disorders of vagina: Secondary | ICD-10-CM | POA: Insufficient documentation

## 2020-09-15 DIAGNOSIS — Z3480 Encounter for supervision of other normal pregnancy, unspecified trimester: Secondary | ICD-10-CM

## 2020-09-15 DIAGNOSIS — O98313 Other infections with a predominantly sexual mode of transmission complicating pregnancy, third trimester: Secondary | ICD-10-CM

## 2020-09-15 DIAGNOSIS — O26893 Other specified pregnancy related conditions, third trimester: Secondary | ICD-10-CM | POA: Insufficient documentation

## 2020-09-15 DIAGNOSIS — O23592 Infection of other part of genital tract in pregnancy, second trimester: Secondary | ICD-10-CM

## 2020-09-15 LAB — WET PREP, GENITAL
Clue Cells Wet Prep HPF POC: NONE SEEN
Sperm: NONE SEEN
Yeast Wet Prep HPF POC: NONE SEEN

## 2020-09-15 LAB — URINALYSIS, ROUTINE W REFLEX MICROSCOPIC
Bilirubin Urine: NEGATIVE
Glucose, UA: NEGATIVE mg/dL
Hgb urine dipstick: NEGATIVE
Ketones, ur: NEGATIVE mg/dL
Nitrite: NEGATIVE
Protein, ur: 30 mg/dL — AB
RBC / HPF: >50 RBC/hpf — ABNORMAL HIGH (ref 0–5)
Specific Gravity, Urine: 1.019 (ref 1.005–1.030)
WBC, UA: >50 WBC/hpf — ABNORMAL HIGH (ref 0–5)
pH: 7 (ref 5.0–8.0)

## 2020-09-15 LAB — AMNISURE RUPTURE OF MEMBRANE (ROM) NOT AT ARMC: Amnisure ROM: NEGATIVE

## 2020-09-15 NOTE — MAU Provider Note (Signed)
History     CSN: [id number]  Arrival date and time: 09/15/20 1320   Event Date/Time   First Provider Initiated Contact with Patient 09/15/20 1345      Chief Complaint  Patient presents with   Rupture of Membranes   HPI  At 12:30pm today she had a "gush" of fluid and has continuous leakage since. Fluid was clearish yellow on arrival, but clear and watery when her gush at work. 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 This has happened before, which she states was due to STIs. H/O GC/CT/Trich s/p treatment from early in pregnancy. 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 Denies CTX, +FM. Denies urinary complaints but does state frequent urination.   OB History     Gravida  3   Para  1   Term  1   Preterm      AB  1   Living  1      SAB      IAB  1   Ectopic      Multiple  0   Live Births  1           Past Medical History:  Diagnosis Date   ASCUS with positive high risk HPV cervical 05/25/2020   Trichomonal vaginitis during pregnancy in second trimester 08/13/2020    Past Surgical History:  Procedure Laterality Date   WISDOM TOOTH EXTRACTION      Family History  Problem Relation Age of Onset   Thyroid disease Maternal Aunt    Hypertension Maternal Aunt    Thyroid disease Paternal Uncle    Thyroid disease Paternal Grandmother    Healthy Mother    Healthy Father     Social History   Tobacco Use   Smoking status: Former    Pack years: 0.00   Smokeless tobacco: Never   Tobacco comments:    last used when confirmed pregnancy  Vaping Use   Vaping Use: Never used  Substance Use Topics   Alcohol use: No    Alcohol/week: 0.0 standard drinks   Drug use: No    Types: Marijuana    Comment: last used a year ago    Allergies: No Known Allergies  Medications Prior to Admission  Medication Sig Dispense Refill Last Dose   metroNIDAZOLE (FLAGYL) 500 MG tablet Take 1 tablet (500 mg  total) by mouth 2 (two) times daily. 14 tablet 0    Blood Pressure Monitoring (BLOOD PRESSURE KIT) DEVI Check BP readings at home regularly Large cuff z34.80 1 each 0    cyclobenzaprine (FLEXERIL) 5 MG tablet Take 1 tablet (5 mg total) by mouth 3 (three) times daily as needed for muscle spasms. 20 tablet 0    Misc. Devices (GOJJI WEIGHT SCALE) MISC 1 Device by Does not apply route as needed. 1 each 0    Prenatal Vit-Fe Fumarate-FA (PREPLUS) 27-1 MG TABS Take 1 tablet by mouth daily. 30 tablet 13    promethazine (PHENERGAN) 25 MG tablet Take 1 tablet (25 mg total) by mouth every 6 (six) hours as needed for nausea or vomiting. 30 tablet 2     Review of Systems  Constitutional:  Negative for chills and fever.  Respiratory:  Negative for cough and shortness of breath.   Cardiovascular:  Negative for chest pain.  Gastrointestinal:  Negative for abdominal pain.  Genitourinary:  Positive for vaginal discharge.  Skin:  Negative for rash.  Neurological:  Negative for  headaches.  Physical Exam   Blood pressure (!) 106/53, pulse 71, temperature 98.2 F (36.8 C), temperature source Oral, resp. rate 16, last menstrual period 02/02/2020, SpO2 100 %.  Physical Exam Exam conducted with a chaperone present.  Constitutional:      General: She is not in acute distress.    Appearance: She is well-developed.  HENT:     Head: Normocephalic and atraumatic.  Cardiovascular:     Rate and Rhythm: Normal rate.  Pulmonary:     Effort: Pulmonary effort is normal. No respiratory distress.  Genitourinary:    Exam position: Lithotomy position.     Labia:        Right: No rash or tenderness.        Left: No rash or tenderness.      Vagina: No signs of injury. Vaginal discharge present. No bleeding.     Cervix: No cervical motion tenderness, friability or erythema.     Uterus: Not tender.      Comments: Copious amounts of yellowish cloudy frothy discharge. No pooling. No fluid from cervix. No  bleeding. Musculoskeletal:        General: No tenderness.  Skin:    General: Skin is warm and dry.     Findings: No erythema or rash.  Neurological:     Mental Status: She is alert and oriented to person, place, and time.    MAU Course  Procedures  MDM Fern NEG SSE: No pooling, copious yellow frothy milky discharge. No VB. Cervix appears closed. Samples collected, amnisure collected. Amnisure NEG +TRICH +BV FHT appropriate for GA, reactive and reassuring NST: 150bpm, mod var, +accels, no decels. No CTX. OK to d/c home.   Assessment and Plan  Tracy [name] is a 25 y.o. at G3P1011 at 56w1dhere for vaginal discharge/leaking fluid.  1. Trichomonas vaginitis - Discharge patient  2. Encounter for supervision of other normal pregnancy, third trimester - Discharge patient  3. Vaginal discharge during pregnancy in third trimester - Discharge patient  4. Trichomonal vaginitis during pregnancy, antepartum, second trimester  5. Supervision of other normal pregnancy, antepartum   - Metronidazole already sent to pharmacy on 09/11/20, patient has not picked up and will do so today. 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 - Discussed with patient again to avoid intercourse with partner until BOTH have completed treatment and for over 1 week after treatment for both. - Amnisure neg, fern neg, pooling neg, very unlikely ROM, NST reactive and reassuring for GA.  - D/C home with return precautions.  ElKatherine BassetDO 09/15/2020, 1:46 PM

## 2020-09-15 NOTE — MAU Note (Signed)
Pt reports feeling a gush of fluid 1200 today. Pt reports continued leaking of fluid since.   Denies vaginal bleeding.

## 2020-09-16 LAB — GC/CHLAMYDIA PROBE AMP (~~LOC~~) NOT AT ARMC
Chlamydia: NEGATIVE
Comment: NEGATIVE
Comment: NORMAL
Neisseria Gonorrhea: NEGATIVE

## 2020-09-18 ENCOUNTER — Telehealth: Payer: Self-pay | Admitting: Advanced Practice Midwife

## 2020-09-18 NOTE — Telephone Encounter (Signed)
Patient called stating she had and episode of spotting.  She denies any recent intercourse.  When asked about fetal movements she states they are decreased.  Advised patient to come in to office now to be evaluated.    Patient stated she was at work and could not come until afternoon.  Notified patient we close at noon and we advise she be evaluated at MAU at [facility]

## 2020-09-24 ENCOUNTER — Encounter: Payer: Self-pay | Admitting: Advanced Practice Midwife

## 2020-09-24 ENCOUNTER — Ambulatory Visit (INDEPENDENT_AMBULATORY_CARE_PROVIDER_SITE_OTHER): Payer: Medicaid Other | Admitting: Advanced Practice Midwife

## 2020-09-24 ENCOUNTER — Other Ambulatory Visit: Payer: Self-pay

## 2020-09-24 VITALS — BP 101/59 | HR 75 | Wt 153.0 lb

## 2020-09-24 DIAGNOSIS — A5901 Trichomonal vulvovaginitis: Secondary | ICD-10-CM

## 2020-09-24 DIAGNOSIS — Z3A29 29 weeks gestation of pregnancy: Secondary | ICD-10-CM

## 2020-09-24 DIAGNOSIS — Z3480 Encounter for supervision of other normal pregnancy, unspecified trimester: Secondary | ICD-10-CM

## 2020-09-24 DIAGNOSIS — O23593 Infection of other part of genital tract in pregnancy, third trimester: Secondary | ICD-10-CM

## 2020-09-24 NOTE — Progress Notes (Signed)
ROB [redacted]w[redacted]d  + TRICH on 09/10/20. Pt notes still taking Tx medication.

## 2020-09-24 NOTE — Progress Notes (Signed)
   PRENATAL VISIT NOTE  Subjective:  Tracy [name] is a 25 y.o. G3P1011 at [redacted]w[redacted]d being seen today for ongoing prenatal care.  She is currently monitored for the following issues for this low-risk pregnancy and has Heart murmur previously undiagnosed; Maternal varicella, non-immune; Supervision of other normal pregnancy, antepartum; Subchorionic hematoma in first trimester; Gonorrhea affecting pregnancy; Chlamydia infection affecting pregnancy; ASCUS with positive high risk HPV cervical; and Trichomonal vaginitis during pregnancy, antepartum, second trimester on their problem list.  Patient reports no complaints.  Contractions: Not present. Vag. Bleeding: None.  Movement: Present. Denies leaking of fluid.   The following portions of the patient's history were reviewed and updated as appropriate: allergies, current medications, past family history, past medical history, past social history, past surgical history and problem list.   Objective:   Vitals:   09/24/20 0924  BP: (!) 101/59  Pulse: 75  Weight: 153 lb (69.4 kg)    Fetal Status: Fetal Heart Rate (bpm): 155   Movement: Present     General:  Alert, oriented and cooperative. Patient is in no acute distress.  Skin: Skin is warm and dry. No rash noted.   Cardiovascular: Normal heart rate noted  Respiratory: Normal respiratory effort, no problems with respiration noted  Abdomen: Soft, gravid, appropriate for gestational age.  Pain/Pressure: Absent     Pelvic: Cervical exam deferred        Extremities: Normal range of motion.  Edema: Trace  Mental Status: Normal mood and affect. Normal behavior. Normal judgment and thought content.   Assessment and Plan:  Pregnancy: G3P1011 at [redacted]w[redacted]d 1. Supervision of other normal pregnancy, antepartum --Anticipatory guidance about next visits/weeks of pregnancy given. --Reviewed 28 week labs, normal GTT  2. [redacted] weeks gestation of pregnancy   3. Trichomonal vaginitis during pregnancy in  third trimester --Positive on 6/2, 6/30, and 7/5.   Rx on 6/6 and again on 7/1. --Pt currently completing Flagyl course.  --Pt reports she has not been sexually active since early pregnancy so is not being reinfected.  --TOC for recent 7 day treatment at next visit, if still positive, consider alternative treatments including IV Flagyl or Tinidazole depending on insurance coverage.   Preterm labor symptoms and general obstetric precautions including but not limited to vaginal bleeding, contractions, leaking of fluid and fetal movement were reviewed in detail with the patient. Please refer to After Visit Summary for other counseling recommendations.   No follow-ups on file.  No future appointments.  Sharen Counter, CNM

## 2020-10-08 ENCOUNTER — Other Ambulatory Visit: Payer: Self-pay

## 2020-10-08 ENCOUNTER — Encounter: Payer: Self-pay | Admitting: Obstetrics

## 2020-10-08 ENCOUNTER — Other Ambulatory Visit (HOSPITAL_COMMUNITY)
Admission: RE | Admit: 2020-10-08 | Discharge: 2020-10-08 | Disposition: A | Discharge status: Home / Self Care | Payer: Medicaid Other | Source: Ambulatory Visit | Attending: Obstetrics | Admitting: Obstetrics

## 2020-10-08 ENCOUNTER — Ambulatory Visit (INDEPENDENT_AMBULATORY_CARE_PROVIDER_SITE_OTHER): Payer: Medicaid Other | Admitting: Obstetrics

## 2020-10-08 VITALS — BP 112/78 | HR 80 | Wt 154.0 lb

## 2020-10-08 DIAGNOSIS — N898 Other specified noninflammatory disorders of vagina: Secondary | ICD-10-CM | POA: Insufficient documentation

## 2020-10-08 DIAGNOSIS — O23593 Infection of other part of genital tract in pregnancy, third trimester: Secondary | ICD-10-CM | POA: Insufficient documentation

## 2020-10-08 DIAGNOSIS — A5901 Trichomonal vulvovaginitis: Secondary | ICD-10-CM

## 2020-10-08 DIAGNOSIS — Z3480 Encounter for supervision of other normal pregnancy, unspecified trimester: Secondary | ICD-10-CM

## 2020-10-08 NOTE — Progress Notes (Signed)
Subjective:  Tracy [name] is a 25 y.o. G3P1011 at [redacted]w[redacted]d being seen today for ongoing prenatal care.  She is currently monitored for the following issues for this low-risk pregnancy and has Heart murmur previously undiagnosed; Maternal varicella, non-immune; Supervision of other normal pregnancy, antepartum; Subchorionic hematoma in first trimester; Gonorrhea affecting pregnancy; Chlamydia infection affecting pregnancy; ASCUS with positive high risk HPV cervical; and Trichomonal vaginitis during pregnancy, antepartum, second trimester on their problem list.  Patient reports no complaints.  Contractions: Not present. Vag. Bleeding: None.  Movement: Present. Denies leaking of fluid.   The following portions of the patient's history were reviewed and updated as appropriate: allergies, current medications, past family history, past medical history, past social history, past surgical history and problem list. Problem list updated.  Objective:   Vitals:   10/08/20 0914  BP: 112/78  Pulse: 80  Weight: 154 lb (69.9 kg)    Fetal Status: Fetal Heart Rate (bpm): 148   Movement: Present     General:  Alert, oriented and cooperative. Patient is in no acute distress.  Skin: Skin is warm and dry. No rash noted.   Cardiovascular: Normal heart rate noted  Respiratory: Normal respiratory effort, no problems with respiration noted  Abdomen: Soft, gravid, appropriate for gestational age. Pain/Pressure: Absent     Pelvic:  Cervical exam deferred        Extremities: Normal range of motion.  Edema: Trace  Mental Status: Normal mood and affect. Normal behavior. Normal judgment and thought content.   Urinalysis:      Assessment and Plan:  Pregnancy: G3P1011 at [redacted]w[redacted]d  1. Supervision of other normal pregnancy, antepartum  2. Trichomonal vaginitis during pregnancy in third trimester, treated. - needs TOC cultures today Rx: - Cervicovaginal ancillary only( [place])  3. Vaginal discharge Rx: -  Cervicovaginal ancillary only( )   Preterm labor symptoms and general obstetric precautions including but not limited to vaginal bleeding, contractions, leaking of fluid and fetal movement were reviewed in detail with the patient. Please refer to After Visit Summary for other counseling recommendations.   Return in about 2 weeks (around 10/22/2020) for ROB.   Brock Bad, MD  10/08/20

## 2020-10-08 NOTE — Progress Notes (Signed)
Pt presents for ROB TOC trich due today - pt completed ABx

## 2020-10-09 LAB — CERVICOVAGINAL ANCILLARY ONLY
Bacterial Vaginitis (gardnerella): NEGATIVE
Candida Glabrata: NEGATIVE
Candida Vaginitis: NEGATIVE
Chlamydia: NEGATIVE
Comment: NEGATIVE
Comment: NEGATIVE
Comment: NEGATIVE
Comment: NEGATIVE
Comment: NEGATIVE
Comment: NORMAL
Neisseria Gonorrhea: NEGATIVE
Trichomonas: POSITIVE — AB

## 2020-10-12 ENCOUNTER — Telehealth: Payer: Self-pay

## 2020-10-12 ENCOUNTER — Other Ambulatory Visit: Payer: Self-pay | Admitting: Obstetrics

## 2020-10-12 DIAGNOSIS — A599 Trichomoniasis, unspecified: Secondary | ICD-10-CM

## 2020-10-12 MED ORDER — METRONIDAZOLE 500 MG PO TABS
500.0000 mg | ORAL_TABLET | Freq: Two times a day (BID) | ORAL | 0 refills | Status: DC
Start: 2020-10-12 — End: 2020-10-22

## 2020-10-12 NOTE — Telephone Encounter (Signed)
-----   Message from Brock Bad, MD sent at 10/12/2020  8:24 AM EDT ----- Flagyl Rx for [employer]

## 2020-10-12 NOTE — Telephone Encounter (Signed)
Patient reviewed test result via my-chart.

## 2020-10-22 ENCOUNTER — Ambulatory Visit (INDEPENDENT_AMBULATORY_CARE_PROVIDER_SITE_OTHER): Payer: Medicaid Other | Admitting: Obstetrics

## 2020-10-22 ENCOUNTER — Other Ambulatory Visit: Payer: Self-pay

## 2020-10-22 ENCOUNTER — Encounter: Payer: Self-pay | Admitting: Obstetrics

## 2020-10-22 VITALS — BP 111/62 | HR 62 | Wt 154.3 lb

## 2020-10-22 DIAGNOSIS — Z3480 Encounter for supervision of other normal pregnancy, unspecified trimester: Secondary | ICD-10-CM

## 2020-10-22 DIAGNOSIS — A5901 Trichomonal vulvovaginitis: Secondary | ICD-10-CM

## 2020-10-22 MED ORDER — TINIDAZOLE 500 MG PO TABS: 2.0000 g | ORAL_TABLET | Freq: Every day | ORAL | 0 refills | Status: DC

## 2020-10-22 NOTE — Progress Notes (Signed)
Subjective:  Tracy [name] is a 25 y.o. G3P1011 at [redacted]w[redacted]d being seen today for ongoing prenatal care.  She is currently monitored for the following issues for this low-risk pregnancy and has Heart murmur previously undiagnosed; Maternal varicella, non-immune; Supervision of other normal pregnancy, antepartum; Subchorionic hematoma in first trimester; Gonorrhea affecting pregnancy; Chlamydia infection affecting pregnancy; ASCUS with positive high risk HPV cervical; and Trichomonal vaginitis during pregnancy, antepartum, second trimester on their problem list.  Patient reports  concern about recurrent Trichomonas, despite several treatments with Flagyl .  Contractions: Not present. Vag. Bleeding: None.  Movement: Present. Denies leaking of fluid.   The following portions of the patient's history were reviewed and updated as appropriate: allergies, current medications, past family history, past medical history, past social history, past surgical history and problem list. Problem list updated.  Objective:   Vitals:   10/22/20 0911  BP: 111/62  Pulse: 62  Weight: 154 lb 4.8 oz (70 kg)    Fetal Status:     Movement: Present     General:  Alert, oriented and cooperative. Patient is in no acute distress.  Skin: Skin is warm and dry. No rash noted.   Cardiovascular: Normal heart rate noted  Respiratory: Normal respiratory effort, no problems with respiration noted  Abdomen: Soft, gravid, appropriate for gestational age. Pain/Pressure: Absent     Pelvic:  Cervical exam deferred        Extremities: Normal range of motion.  Edema: Trace  Mental Status: Normal mood and affect. Normal behavior. Normal judgment and thought content.   Urinalysis:      Assessment and Plan:  Pregnancy: G3P1011 at [redacted]w[redacted]d  1. Supervision of other normal pregnancy, antepartum  2. Trichomonal vulvovaginitis Rx: - tinidazole (TINDAMAX) 500 MG tablet; Take 4 tablets (2,000 mg total) by mouth daily with breakfast.   Dispense: 8 tablet; Refill: 0  Preterm labor symptoms and general obstetric precautions including but not limited to vaginal bleeding, contractions, leaking of fluid and fetal movement were reviewed in detail with the patient. Please refer to After Visit Summary for other counseling recommendations.   Return in about 2 weeks (around 11/05/2020) for ROB.   Brock Bad, MD  10/22/20

## 2020-10-22 NOTE — Progress Notes (Signed)
Pt presents for ROB.  Recent Trich culture positive again. Pt concerned Abx for Ivery Quale is not working.

## 2020-11-05 ENCOUNTER — Other Ambulatory Visit: Payer: Self-pay

## 2020-11-05 ENCOUNTER — Ambulatory Visit (INDEPENDENT_AMBULATORY_CARE_PROVIDER_SITE_OTHER): Payer: Medicaid Other

## 2020-11-05 VITALS — BP 114/73 | HR 84 | Wt 157.0 lb

## 2020-11-05 DIAGNOSIS — Z3480 Encounter for supervision of other normal pregnancy, unspecified trimester: Secondary | ICD-10-CM

## 2020-11-05 DIAGNOSIS — R12 Heartburn: Secondary | ICD-10-CM

## 2020-11-05 DIAGNOSIS — Z3A35 35 weeks gestation of pregnancy: Secondary | ICD-10-CM

## 2020-11-05 DIAGNOSIS — O26899 Other specified pregnancy related conditions, unspecified trimester: Secondary | ICD-10-CM

## 2020-11-05 MED ORDER — FAMOTIDINE 20 MG PO TABS: 20.0000 mg | ORAL_TABLET | Freq: Every day | ORAL | 1 refills | Status: DC

## 2020-11-05 NOTE — Progress Notes (Signed)
LOW-RISK PREGNANCY OFFICE VISIT  Patient name: Tracy [name] MRN [id number]  Date of birth: [birth year] Chief Complaint:   Routine Prenatal Visit  Subjective:   Tracy [name] is a 25 y.o. G22P1011 female at [redacted]w[redacted]d with an Estimated Date of Delivery: 12/07/20 being seen today for ongoing management of a low-risk pregnancy aeb has Heart murmur previously undiagnosed; Maternal varicella, non-immune; Supervision of other normal pregnancy, antepartum; Subchorionic hematoma in first trimester; Gonorrhea affecting pregnancy; Chlamydia infection affecting pregnancy; ASCUS with positive high risk HPV cervical; and Trichomonal vaginitis during pregnancy, antepartum, second trimester on their problem list.  Patient presents today with backache, heartburn, nausea, occasional contractions, vomiting, and insomnia .  Patient endorses fetal movement. Patient denies abdominal cramping or contractions.  Patient denies vaginal concerns including abnormal discharge, leaking of fluid, and bleeding.  Contractions: Not present. Vag. Bleeding: None.  Movement: Present.  Patient states that she drinks about 3 bottles of water daily.  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  Patient states her heartburn has worsened and is not responsive to tums. She states she has been waking up at 2 or 5 am in the morning and not been able to return to sleep.  She reports pelvic pain and soreness.  She states she has contractions throughout [place] [employer], but has not had any since.   Reviewed past medical,surgical, social, obstetrical and family history as well as problem list, medications and allergies.  Objective   Vitals:   11/05/20 0948  BP: 114/73  Pulse: 84  Weight: 157 lb (71.2 kg)  Body mass index is 27.81 kg/m.  Total Weight Gain:29 lb (13.2 kg)         Physical Examination:   General appearance: Well appearing, and in  no distress  Mental status: Alert, oriented to person, place, and time  Skin: Warm & dry  Cardiovascular: Normal heart rate noted  Respiratory: Normal respiratory effort, no distress  Abdomen: Soft, gravid, nontender, AGA with Fundal Height: 36 cm  Pelvic: Cervical exam deferred           Extremities: Edema: None  Fetal Status: Fetal Heart Rate (bpm): 150  Movement: Present   No results found for this or any previous visit (from the past 24 hour(s)).  Assessment & Plan:  Low-risk pregnancy of a 25 y.o., G3P1011 at [redacted]w[redacted]d with an Estimated Date of Delivery: 12/07/20   1. Supervision of other normal pregnancy, antepartum -Anticipatory guidance for upcoming appts. -Patient to schedule next appt in 1 weeks for an in-person visit. -Educated on GBS bacteria including what it is, why we test, and how and when we treat if needed. -Informed that this testing, along with GC/CT and TOC for Trich to be performed next week. -Discussed how time is still needed to allow for recent treatment with Tindamax.   2. [redacted] weeks gestation of pregnancy -Addressed complaints. -Instructed to increase water intake to reduce incident of contractions/cramping.  -Encouraged to continue usage of maternity belt especially while working.  -Reassured that pelvic pain and soreness is common and expected complaint during pregnancy.  -Informed that cervical exam would be deferred today as no recent contractions.  -Addressed insomnia and instructed to avoid stimulation upon waking (I.e. looking at clock, watching tv). -Discussed that provider does not recommend sleep aides since she has to care for her toddler alone at night.   3. Heartburn during pregnancy,  antepartum -Educated on physiologic changes that occur during pregnancy that results in heartburn. -Discussed usage of medication for heartburn and rx for Pepcid sent to pharmacy on file.  -Will start with daily dosing, but can increase to twice daily if necessary.      Meds:  Meds ordered this encounter  Medications   famotidine (PEPCID) 20 MG tablet    Sig: Take 1 tablet (20 mg total) by mouth daily. May increase to twice daily if needed.    Dispense:  45 tablet    Refill:  1    Order Specific Question:   Supervising Provider    Answer:   Reva Bores [[id number]]   Labs/procedures today:  Lab Orders  No laboratory test(s) ordered today     Reviewed: Preterm labor symptoms and general obstetric precautions including but not limited to vaginal bleeding, contractions, leaking of fluid and fetal movement were reviewed in detail with the patient.  All questions were answered.  Follow-up: No follow-ups on file.  No orders of the defined types were placed in this encounter.  Cherre Robins MSN, CNM 11/05/2020

## 2020-11-12 ENCOUNTER — Ambulatory Visit (INDEPENDENT_AMBULATORY_CARE_PROVIDER_SITE_OTHER): Payer: Medicaid Other | Admitting: Women's Health

## 2020-11-12 ENCOUNTER — Other Ambulatory Visit (HOSPITAL_COMMUNITY)
Admission: RE | Admit: 2020-11-12 | Discharge: 2020-11-12 | Disposition: A | Discharge status: Home / Self Care | Payer: Medicaid Other | Source: Ambulatory Visit | Attending: Women's Health | Admitting: Women's Health

## 2020-11-12 ENCOUNTER — Other Ambulatory Visit: Payer: Self-pay

## 2020-11-12 VITALS — BP 103/57 | HR 79 | Wt 158.0 lb

## 2020-11-12 DIAGNOSIS — Z3480 Encounter for supervision of other normal pregnancy, unspecified trimester: Secondary | ICD-10-CM | POA: Insufficient documentation

## 2020-11-12 DIAGNOSIS — Z3A36 36 weeks gestation of pregnancy: Secondary | ICD-10-CM

## 2020-11-12 DIAGNOSIS — R87810 Cervical high risk human papillomavirus (HPV) DNA test positive: Secondary | ICD-10-CM

## 2020-11-12 DIAGNOSIS — A5901 Trichomonal vulvovaginitis: Secondary | ICD-10-CM

## 2020-11-12 DIAGNOSIS — R011 Cardiac murmur, unspecified: Secondary | ICD-10-CM

## 2020-11-12 DIAGNOSIS — O23592 Infection of other part of genital tract in pregnancy, second trimester: Secondary | ICD-10-CM

## 2020-11-12 DIAGNOSIS — R87610 Atypical squamous cells of undetermined significance on cytologic smear of cervix (ASC-US): Secondary | ICD-10-CM

## 2020-11-12 NOTE — Patient Instructions (Signed)
Maternity Assessment Unit (MAU)  The Maternity Assessment Unit (MAU) is located at the [facility] at [facility]. The address is: [address], [place], [place], [place] [address]. Please see map below for additional directions.    The Maternity Assessment Unit is designed to help you during your pregnancy, and for up to 6 weeks after delivery, with any pregnancy- or postpartum-related emergencies, if you think you are in labor, or if your water has broken. For example, if you experience nausea and vomiting, vaginal bleeding, severe abdominal or pelvic pain, elevated blood pressure or other problems related to your pregnancy or postpartum time, please come to the Maternity Assessment Unit for assistance.       Signs and Symptoms of Labor Labor is the body's natural process of moving the baby and the placenta out of the uterus. The process of labor usually starts when the baby is full-term, between 34 and 40 weeks of pregnancy. Signs and symptoms that you are close to going into labor As your body prepares for labor and the birth of your baby, you may notice the following symptoms in the weeks and days before true labor starts: Passing a small amount of thick, bloody mucus from your vagina. This is called normal bloody show or losing your mucus plug. This may happen more than a week before labor begins, or right before labor begins, as the opening of the cervix starts to widen (dilate). For some women, the entire mucus plug passes at once. For others, pieces of the mucus plug may gradually pass over several days. Your baby moving (dropping) lower in your pelvis to get into position for birth (lightening). When this happens, you may feel more pressure on your bladder and pelvic bone and less pressure on your ribs. This may make it easier to breathe. It may also cause you to need to urinate more often and have problems with bowel movements. Having "practice  contractions," also called Braxton Hicks contractions or false labor. These occur at irregular (unevenly spaced) intervals that are more than 10 minutes apart. False labor contractions are common after exercise or sexual activity. They will stop if you change position, rest, or drink fluids. These contractions are usually mild and do not get stronger over time. They may feel like: A backache or back pain. Mild cramps, similar to menstrual cramps. Tightening or pressure in your abdomen. Other early symptoms include: Nausea or loss of appetite. Diarrhea. Having a sudden burst of energy, or feeling very tired. Mood changes. Having trouble sleeping. Signs and symptoms that labor has begun Signs that you are in labor may include: Having contractions that come at regular (evenly spaced) intervals and increase in intensity. This may feel like more intense tightening or pressure in your abdomen that moves to your back. Contractions may also feel like rhythmic pain in your upper thighs or back that comes and goes at regular intervals. For first-time mothers, this change in intensity of contractions often occurs at a more gradual pace. Women who have given birth before may notice a more rapid progression of contraction changes. Feeling pressure in the vaginal area. Your water breaking (rupture of membranes). This is when the sac of fluid that surrounds your baby breaks. Fluid leaking from your vagina may be clear or blood-tinged. Labor usually starts within 24 hours of your water breaking, but it may take longer to begin. Some women may feel a sudden gush of fluid. Others notice that their underwear repeatedly  damp. Follow these instructions at home:  When labor starts, or if your water breaks, call your health care provider or nurse care line. Based on your situation, they will determine when you should go in for an exam. During early labor, you may be able to rest and manage symptoms at home.  Some strategies to try at home include: Breathing and relaxation techniques. Taking a warm bath or shower. Listening to music. Using a heating pad on the lower back for pain. If you are directed to use heat: Place a towel between your skin and the heat source. Leave the heat on for 20-30 minutes. Remove the heat if your skin turns bright red. This is especially important if you are unable to feel pain, heat, or cold. You may have a greater risk of getting burned. Contact a health care provider if: Your labor has started. Your water breaks. Get help right away if: You have painful, regular contractions that are 5 minutes apart or less. Labor starts before you are [redacted] weeks along in your pregnancy. You have a fever. You have bright red blood coming from your vagina. You do not feel your baby moving. You have a severe headache with or without vision problems. You have severe nausea, vomiting, or diarrhea. You have chest pain or shortness of breath. These symptoms may represent a serious problem that is an emergency. Do not wait to see if the symptoms will go away. Get medical help right away. Call your local emergency services (911 in the [place] Do not drive yourself to the hospital. Summary Labor is your body's natural process of moving your baby and the placenta out of your uterus. The process of labor usually starts when your baby is full-term, between 37 and 40 weeks of pregnancy. When labor starts, or if your water breaks, call your health care provider or nurse care line. Based on your situation, they will determine when you should go in for an exam. This information is not intended to replace advice given to you by your health care provider. Make sure you discuss any questions you have with your healthcare provider. Document Revised: 12/21/2019 Document Reviewed: 12/21/2019 Elsevier Patient Education  2022 Elsevier Inc.  

## 2020-11-12 NOTE — Progress Notes (Signed)
Subjective:  Tracy [name] is a 25 y.o. G3P1011 at 73w3dbeing seen today for ongoing prenatal care.  She is currently monitored for the following issues for this low-risk pregnancy and has Heart murmur previously undiagnosed; Maternal varicella, non-immune; Supervision of other normal pregnancy, antepartum; Gonorrhea affecting pregnancy; Chlamydia infection affecting pregnancy; ASCUS with positive high risk HPV cervical; and Trichomonal vaginitis during pregnancy, antepartum, second trimester on their problem list.  Patient reports no complaints.  Contractions: Irregular. Vag. Bleeding: None.  Movement: Present. Denies leaking of fluid.   The following portions of the patient's history were reviewed and updated as appropriate: allergies, current medications, past family history, past medical history, past social history, past surgical history and problem list. Problem list updated.  Objective:   Vitals:   11/12/20 0959  BP: (!) 103/57  Pulse: 79  Weight: 158 lb (71.7 kg)    Fetal Status: Fetal Heart Rate (bpm): 146 Fundal Height: 37 cm Movement: Present  Presentation: Undeterminable  General:  Alert, oriented and cooperative. Patient is in no acute distress.  Skin: Skin is warm and dry. No rash noted.   Cardiovascular: Normal heart rate noted  Respiratory: Normal respiratory effort, no problems with respiration noted  Abdomen: Soft, gravid, appropriate for gestational age. Pain/Pressure: Present     Pelvic: Vag. Bleeding: None     Cervical exam deferred Dilation: 2 Effacement (%): Thick Station: Ballotable  Extremities: Normal range of motion.  Edema: Trace  Mental Status: Normal mood and affect. Normal behavior. Normal judgment and thought content.   Urinalysis:      Assessment and Plan:  Pregnancy: G3P1011 at 367w3d1. Trichomonal vaginitis during pregnancy, antepartum, second trimester -no TOC today, pt took meds for retreatment 10/23/2020, need to retest 4 weeks  later.  2. ASCUS with positive high risk HPV cervical -colpo ppartum  3. Supervision of other normal pregnancy, antepartum - Culture, beta strep (group b only) - Cervicovaginal ancillary only( [place])  4. Heart murmur previously undiagnosed -pt states is unaware of heart murmur diagnosis, has never been told she has this diagnosis and never met with cardiology. Patient denies palpitations or syncopal episodes.  5. [redacted] weeks gestation of pregnancy  Term labor symptoms and general obstetric precautions including but not limited to vaginal bleeding, contractions, leaking of fluid and fetal movement were reviewed in detail with the patient. I discussed the assessment and treatment plan with the patient. The patient was provided an opportunity to ask questions and all were answered. The patient agreed with the plan and demonstrated an understanding of the instructions. The patient was advised to call back or seek an in-person office evaluation[facility]. Please refer to After Visit Summary for other counseling recommendations.  Return in about 1 week (around 11/19/2020) for in-person LOB/APP OK.   Karolynn Infantino, NiGerrie NordmannNP

## 2020-11-12 NOTE — Progress Notes (Signed)
ct+ Fetal movement. Pt c/o frequent heartburn with some N/V. Pt would like to have cervical check today.

## 2020-11-13 LAB — CERVICOVAGINAL ANCILLARY ONLY
Chlamydia: NEGATIVE
Comment: NEGATIVE
Comment: NORMAL
Neisseria Gonorrhea: NEGATIVE

## 2020-11-16 LAB — CULTURE, BETA STREP (GROUP B ONLY): Strep Gp B Culture: NEGATIVE

## 2020-11-19 ENCOUNTER — Other Ambulatory Visit (HOSPITAL_COMMUNITY)
Admission: RE | Admit: 2020-11-19 | Discharge: 2020-11-19 | Disposition: A | Discharge status: Home / Self Care | Payer: Medicaid Other | Source: Ambulatory Visit

## 2020-11-19 ENCOUNTER — Other Ambulatory Visit: Payer: Self-pay

## 2020-11-19 ENCOUNTER — Ambulatory Visit (INDEPENDENT_AMBULATORY_CARE_PROVIDER_SITE_OTHER): Payer: Medicaid Other

## 2020-11-19 VITALS — BP 102/65 | HR 76 | Wt 159.0 lb

## 2020-11-19 DIAGNOSIS — A5901 Trichomonal vulvovaginitis: Secondary | ICD-10-CM

## 2020-11-19 DIAGNOSIS — O23592 Infection of other part of genital tract in pregnancy, second trimester: Secondary | ICD-10-CM | POA: Insufficient documentation

## 2020-11-19 DIAGNOSIS — Z3A37 37 weeks gestation of pregnancy: Secondary | ICD-10-CM

## 2020-11-19 DIAGNOSIS — Z3480 Encounter for supervision of other normal pregnancy, unspecified trimester: Secondary | ICD-10-CM

## 2020-11-19 NOTE — Patient Instructions (Signed)

## 2020-11-19 NOTE — Progress Notes (Signed)
   PRENATAL VISIT NOTE  Subjective:  Tracy [name] is a 25 y.o. G3P1011 at [redacted]w[redacted]d being seen today for ongoing prenatal care.  She is currently monitored for the following issues for this low-risk pregnancy and has Heart murmur previously undiagnosed; Maternal varicella, non-immune; Supervision of other normal pregnancy, antepartum; Gonorrhea affecting pregnancy; Chlamydia infection affecting pregnancy; ASCUS with positive high risk HPV cervical; and Trichomonal vaginitis during pregnancy, antepartum, second trimester on their problem list.  Patient reports no complaints.  Contractions: Irritability. Vag. Bleeding: None.  Movement: Present. Denies leaking of fluid.   The following portions of the patient's history were reviewed and updated as appropriate: allergies, current medications, past family history, past medical history, past social history, past surgical history and problem list.   Objective:   Vitals:   11/19/20 1120  BP: 102/65  Pulse: 76  Weight: 159 lb (72.1 kg)    Fetal Status: Fetal Heart Rate (bpm): 142 Fundal Height: 38 cm Movement: Present  Presentation: Vertex  General:  Alert, oriented and cooperative. Patient is in no acute distress.  Skin: Skin is warm and dry. No rash noted.   Cardiovascular: Normal heart rate noted  Respiratory: Normal respiratory effort, no problems with respiration noted  Abdomen: Soft, gravid, appropriate for gestational age.  Pain/Pressure: Present     Pelvic: Cervical exam performed in the presence of a chaperone Dilation: 2 Effacement (%): Thick Station: Ballotable  Extremities: Normal range of motion.  Edema: Trace  Mental Status: Normal mood and affect. Normal behavior. Normal judgment and thought content.   Assessment and Plan:  Pregnancy: G3P1011 at [redacted]w[redacted]d 1. Supervision of other normal pregnancy, antepartum - No complaints. Routine care - Trich test of cure today  2. [redacted] weeks gestation of pregnancy   Term labor symptoms  and general obstetric precautions including but not limited to vaginal bleeding, contractions, leaking of fluid and fetal movement were reviewed in detail with the patient. Please refer to After Visit Summary for other counseling recommendations.   Return in about 1 week (around 11/26/2020) for Return OB visit.  No future appointments.  Rolm Bookbinder, CNM

## 2020-11-19 NOTE — Progress Notes (Signed)
ROB [redacted]w[redacted]d  [id number] discharge.   Pt wants cervix check. Per pt provider told pt to get TOC on 11/12/20 visit

## 2020-11-20 LAB — CERVICOVAGINAL ANCILLARY ONLY
Chlamydia: NEGATIVE
Comment: NEGATIVE
Comment: NEGATIVE
Comment: NORMAL
Neisseria Gonorrhea: NEGATIVE
Trichomonas: NEGATIVE

## 2020-11-27 ENCOUNTER — Ambulatory Visit (INDEPENDENT_AMBULATORY_CARE_PROVIDER_SITE_OTHER): Payer: Medicaid Other | Admitting: Obstetrics

## 2020-11-27 ENCOUNTER — Encounter: Payer: Self-pay | Admitting: Obstetrics

## 2020-11-27 ENCOUNTER — Other Ambulatory Visit: Payer: Self-pay

## 2020-11-27 VITALS — BP 108/65 | HR 80 | Wt 159.4 lb

## 2020-11-27 DIAGNOSIS — Z3480 Encounter for supervision of other normal pregnancy, unspecified trimester: Secondary | ICD-10-CM

## 2020-11-27 DIAGNOSIS — R87810 Cervical high risk human papillomavirus (HPV) DNA test positive: Secondary | ICD-10-CM

## 2020-11-27 DIAGNOSIS — A5901 Trichomonal vulvovaginitis: Secondary | ICD-10-CM

## 2020-11-27 DIAGNOSIS — R87610 Atypical squamous cells of undetermined significance on cytologic smear of cervix (ASC-US): Secondary | ICD-10-CM

## 2020-11-27 DIAGNOSIS — O23592 Infection of other part of genital tract in pregnancy, second trimester: Secondary | ICD-10-CM

## 2020-11-27 NOTE — Progress Notes (Signed)
ROB [redacted]w[redacted]d  CC: Pelvic pain, nothing complaints at this time

## 2020-11-27 NOTE — Progress Notes (Signed)
Subjective:  Tracy [name] is a 25 y.o. G3P1011 at [redacted]w[redacted]d being seen today for ongoing prenatal care.  She is currently monitored for the following issues for this low-risk pregnancy and has Heart murmur previously undiagnosed; Maternal varicella, non-immune; Supervision of other normal pregnancy, antepartum; Gonorrhea affecting pregnancy; Chlamydia infection affecting pregnancy; ASCUS with positive high risk HPV cervical; and Trichomonal vaginitis during pregnancy, antepartum, second trimester on their problem list.  Patient reports  pelvic pressure and pain .  Contractions: Irritability. Vag. Bleeding: None.  Movement: Absent. Denies leaking of fluid.   The following portions of the patient's history were reviewed and updated as appropriate: allergies, current medications, past family history, past medical history, past social history, past surgical history and problem list. Problem list updated.  Objective:   Vitals:   11/27/20 1017  BP: 108/65  Pulse: 80  Weight: 159 lb 6.4 oz (72.3 kg)    Fetal Status:     Movement: Absent     General:  Alert, oriented and cooperative. Patient is in no acute distress.  Skin: Skin is warm and dry. No rash noted.   Cardiovascular: Normal heart rate noted  Respiratory: Normal respiratory effort, no problems with respiration noted  Abdomen: Soft, gravid, appropriate for gestational age. Pain/Pressure: Present     Pelvic:  Cervical exam performed     2cm / 50% / -3 / VTX   Extremities: Normal range of motion.  Edema: Trace  Mental Status: Normal mood and affect. Normal behavior. Normal judgment and thought content.   Urinalysis:      Assessment and Plan:  Pregnancy: G3P1011 at [redacted]w[redacted]d  1. Supervision of other normal pregnancy, antepartum  2. Trichomonal vaginitis during pregnancy, antepartum, second trimester. treated - negative TOC  3. ASCUS with positive high risk HPV cervical - colposcopy 3-4 months postpartum   Term labor symptoms and  general obstetric precautions including but not limited to vaginal bleeding, contractions, leaking of fluid and fetal movement were reviewed in detail with the patient. Please refer to After Visit Summary for other counseling recommendations.   Return in about 1 week (around 12/04/2020) for ROB.   Brock Bad, MD  11/27/20

## 2020-12-03 ENCOUNTER — Other Ambulatory Visit: Payer: Self-pay

## 2020-12-03 ENCOUNTER — Ambulatory Visit (INDEPENDENT_AMBULATORY_CARE_PROVIDER_SITE_OTHER): Payer: Medicaid Other | Admitting: Obstetrics

## 2020-12-03 ENCOUNTER — Encounter: Payer: Self-pay | Admitting: Obstetrics

## 2020-12-03 VITALS — BP 112/67 | HR 75 | Wt 162.7 lb

## 2020-12-03 DIAGNOSIS — Z3A39 39 weeks gestation of pregnancy: Secondary | ICD-10-CM

## 2020-12-03 DIAGNOSIS — R87610 Atypical squamous cells of undetermined significance on cytologic smear of cervix (ASC-US): Secondary | ICD-10-CM

## 2020-12-03 DIAGNOSIS — Z3480 Encounter for supervision of other normal pregnancy, unspecified trimester: Secondary | ICD-10-CM

## 2020-12-03 DIAGNOSIS — A599 Trichomoniasis, unspecified: Secondary | ICD-10-CM

## 2020-12-03 DIAGNOSIS — Z3483 Encounter for supervision of other normal pregnancy, third trimester: Secondary | ICD-10-CM

## 2020-12-03 NOTE — Progress Notes (Signed)
Patient presents for ROB. 

## 2020-12-03 NOTE — Progress Notes (Signed)
ROB 39.3wks No complaints Requests SVE and membrane sweep.

## 2020-12-03 NOTE — Progress Notes (Signed)
Subjective:  Tracy [name] is a 26 y.o. G3P1011 at 102w3d being seen today for ongoing prenatal care.  She is currently monitored for the following issues for this low-risk pregnancy and has Heart murmur previously undiagnosed; Maternal varicella, non-immune; Supervision of other normal pregnancy, antepartum; Gonorrhea affecting pregnancy; Chlamydia infection affecting pregnancy; ASCUS with positive high risk HPV cervical; and Trichomonal vaginitis during pregnancy, antepartum, second trimester on their problem list.  Patient reports occasional contractions, mild.  Contractions: Irritability. Vag. Bleeding: None.  Movement: Present. Denies leaking of fluid.   The following portions of the patient's history were reviewed and updated as appropriate: allergies, current medications, past family history, past medical history, past social history, past surgical history and problem list. Problem list updated.  Objective:   Vitals:   12/03/20 1122  BP: 112/67  Pulse: 75  Weight: 162 lb 11.2 oz (73.8 kg)    Fetal Status: Fetal Heart Rate (bpm): 155   Movement: Present     General:  Alert, oriented and cooperative. Patient is in no acute distress.  Skin: Skin is warm and dry. No rash noted.   Cardiovascular: Normal heart rate noted  Respiratory: Normal respiratory effort, no problems with respiration noted  Abdomen: Soft, gravid, appropriate for gestational age. Pain/Pressure: Present     Pelvic:  Cervical exam performed      2 cm / 50% / -1 / Vtx  Extremities: Normal range of motion.  Edema: Trace  Mental Status: Normal mood and affect. Normal behavior. Normal judgment and thought content.   Urinalysis:      Results for Tracy [name] (MRN [id number]) as of 12/03/2020 11:40  Ref. Range 11/19/2020 11:43  Chlamydia Unknown Negative  Neisseria Gonorrhea Unknown Negative  Trichomonas Unknown Negative   Assessment and Plan:  Pregnancy: G3P1011 at [redacted]w[redacted]d  1. Supervision of other normal  pregnancy, antepartum  2. ASCUS with positive high risk HPV cervical  3. Trichomonas infection, negative TOC on 11-19-20   Term labor symptoms and general obstetric precautions including but not limited to vaginal bleeding, contractions, leaking of fluid and fetal movement were reviewed in detail with the patient. Please refer to After Visit Summary for other counseling recommendations.   Return in about 4 weeks (around 12/31/2020) for postpartum visit.   Brock Bad, MD

## 2020-12-04 ENCOUNTER — Other Ambulatory Visit: Payer: Self-pay | Admitting: Advanced Practice Midwife

## 2020-12-04 ENCOUNTER — Encounter (HOSPITAL_COMMUNITY): Payer: Self-pay | Admitting: *Deleted

## 2020-12-04 ENCOUNTER — Encounter (HOSPITAL_COMMUNITY): Payer: Self-pay

## 2020-12-04 ENCOUNTER — Telehealth (HOSPITAL_COMMUNITY): Payer: Self-pay | Admitting: *Deleted

## 2020-12-04 NOTE — Telephone Encounter (Signed)
Preadmission screen  

## 2020-12-05 ENCOUNTER — Other Ambulatory Visit: Payer: Self-pay | Admitting: Advanced Practice Midwife

## 2020-12-06 ENCOUNTER — Telehealth: Payer: Self-pay | Admitting: Advanced Practice Midwife

## 2020-12-06 NOTE — Telephone Encounter (Signed)
Call to move IOL up to today, 12/06/20. No answer. Left VM to cal L&D.

## 2020-12-07 ENCOUNTER — Other Ambulatory Visit: Payer: Self-pay | Admitting: Obstetrics and Gynecology

## 2020-12-07 LAB — SARS CORONAVIRUS 2 (TAT 6-24 HRS): SARS Coronavirus 2: NEGATIVE

## 2020-12-08 ENCOUNTER — Encounter (HOSPITAL_COMMUNITY): Payer: Self-pay | Admitting: Obstetrics & Gynecology

## 2020-12-08 ENCOUNTER — Inpatient Hospital Stay (HOSPITAL_COMMUNITY)
Admission: AD | Admit: 2020-12-08 | Discharge: 2020-12-10 | DRG: 787 | Disposition: A | Discharge status: Home / Self Care | Payer: Medicaid Other | Attending: Obstetrics & Gynecology | Admitting: Obstetrics & Gynecology

## 2020-12-08 ENCOUNTER — Encounter (HOSPITAL_COMMUNITY)
Admission: AD | Disposition: A | Discharge status: Home / Self Care | Payer: Self-pay | Source: Home / Self Care | Attending: Obstetrics & Gynecology

## 2020-12-08 ENCOUNTER — Ambulatory Visit (HOSPITAL_COMMUNITY): Payer: Medicaid Other | Attending: Obstetrics and Gynecology

## 2020-12-08 ENCOUNTER — Inpatient Hospital Stay (HOSPITAL_COMMUNITY): Payer: Medicaid Other | Admitting: Anesthesiology

## 2020-12-08 ENCOUNTER — Other Ambulatory Visit: Payer: Self-pay

## 2020-12-08 DIAGNOSIS — O26893 Other specified pregnancy related conditions, third trimester: Secondary | ICD-10-CM | POA: Diagnosis present

## 2020-12-08 DIAGNOSIS — Z30017 Encounter for initial prescription of implantable subdermal contraceptive: Secondary | ICD-10-CM | POA: Diagnosis not present

## 2020-12-08 DIAGNOSIS — Z87891 Personal history of nicotine dependence: Secondary | ICD-10-CM

## 2020-12-08 DIAGNOSIS — O9081 Anemia of the puerperium: Secondary | ICD-10-CM | POA: Diagnosis not present

## 2020-12-08 DIAGNOSIS — D62 Acute posthemorrhagic anemia: Secondary | ICD-10-CM | POA: Diagnosis not present

## 2020-12-08 DIAGNOSIS — Z3490 Encounter for supervision of normal pregnancy, unspecified, unspecified trimester: Secondary | ICD-10-CM | POA: Insufficient documentation

## 2020-12-08 DIAGNOSIS — Z3A40 40 weeks gestation of pregnancy: Secondary | ICD-10-CM

## 2020-12-08 DIAGNOSIS — Z98891 History of uterine scar from previous surgery: Secondary | ICD-10-CM

## 2020-12-08 LAB — CBC
HCT: 30.8 % — ABNORMAL LOW (ref 36.0–46.0)
Hemoglobin: 10.1 g/dL — ABNORMAL LOW (ref 12.0–15.0)
MCH: 31.2 pg (ref 26.0–34.0)
MCHC: 32.8 g/dL (ref 30.0–36.0)
MCV: 95.1 fL (ref 80.0–100.0)
Platelets: 168 10*3/uL (ref 150–400)
RBC: 3.24 MIL/uL — ABNORMAL LOW (ref 3.87–5.11)
RDW: 13.2 % (ref 11.5–15.5)
WBC: 8.9 10*3/uL (ref 4.0–10.5)
nRBC: 0 % (ref 0.0–0.2)

## 2020-12-08 LAB — RPR: RPR Ser Ql: NONREACTIVE

## 2020-12-08 SURGERY — Surgical Case
Anesthesia: Epidural

## 2020-12-08 MED ORDER — SCOPOLAMINE 1 MG/3DAYS TD PT72
1.0000 | MEDICATED_PATCH | Freq: Once | TRANSDERMAL | Status: DC
  Administered 2020-12-08: 1.5 mg via TRANSDERMAL

## 2020-12-08 MED ORDER — FENTANYL-BUPIVACAINE-NACL 0.5-0.125-0.9 MG/250ML-% EP SOLN
EPIDURAL | Status: DC | PRN
  Administered 2020-12-08: 12 mL/h via EPIDURAL

## 2020-12-08 MED ORDER — OXYTOCIN-SODIUM CHLORIDE 30-0.9 UT/500ML-% IV SOLN
INTRAVENOUS | Status: AC
  Filled 2020-12-08: qty 500

## 2020-12-08 MED ORDER — SODIUM CHLORIDE 0.9 % IV SOLN
INTRAVENOUS | Status: DC | PRN
  Administered 2020-12-08: 500 mg via INTRAVENOUS

## 2020-12-08 MED ORDER — TERBUTALINE SULFATE 1 MG/ML IJ SOLN: 0.2500 mg | Freq: Once | INTRAMUSCULAR | Status: DC | PRN

## 2020-12-08 MED ORDER — ENOXAPARIN SODIUM 40 MG/0.4ML IJ SOSY
40.0000 mg | PREFILLED_SYRINGE | INTRAMUSCULAR | Status: DC
  Administered 2020-12-08: 40 mg via SUBCUTANEOUS
  Filled 2020-12-08 (×2): qty 0.4

## 2020-12-08 MED ORDER — SCOPOLAMINE 1 MG/3DAYS TD PT72
MEDICATED_PATCH | TRANSDERMAL | Status: AC
  Filled 2020-12-08: qty 1

## 2020-12-08 MED ORDER — LACTATED RINGERS IV SOLN: INTRAVENOUS | Status: DC | PRN

## 2020-12-08 MED ORDER — LACTATED RINGERS IV SOLN: 500.0000 mL | INTRAVENOUS | Status: DC | PRN

## 2020-12-08 MED ORDER — EPHEDRINE 5 MG/ML INJ: 10.0000 mg | INTRAVENOUS | Status: DC | PRN

## 2020-12-08 MED ORDER — ONDANSETRON HCL 4 MG/2ML IJ SOLN
INTRAMUSCULAR | Status: DC | PRN
  Administered 2020-12-08: 4 mg via INTRAVENOUS

## 2020-12-08 MED ORDER — DEXAMETHASONE SODIUM PHOSPHATE 10 MG/ML IJ SOLN
INTRAMUSCULAR | Status: DC | PRN
  Administered 2020-12-08: 10 mg via INTRAVENOUS

## 2020-12-08 MED ORDER — FENTANYL CITRATE (PF) 100 MCG/2ML IJ SOLN: 100.0000 ug | INTRAMUSCULAR | Status: DC | PRN

## 2020-12-08 MED ORDER — MISOPROSTOL 25 MCG QUARTER TABLET: 25.0000 ug | ORAL_TABLET | ORAL | Status: DC | PRN

## 2020-12-08 MED ORDER — FERROUS SULFATE 325 (65 FE) MG PO TABS
325.0000 mg | ORAL_TABLET | ORAL | Status: DC
  Administered 2020-12-09: 325 mg via ORAL
  Filled 2020-12-08: qty 1

## 2020-12-08 MED ORDER — NALBUPHINE HCL 10 MG/ML IJ SOLN: 5.0000 mg | Freq: Once | INTRAMUSCULAR | Status: DC | PRN

## 2020-12-08 MED ORDER — ONDANSETRON HCL 4 MG/2ML IJ SOLN: 4.0000 mg | Freq: Four times a day (QID) | INTRAMUSCULAR | Status: DC | PRN

## 2020-12-08 MED ORDER — ONDANSETRON HCL 4 MG/2ML IJ SOLN
INTRAMUSCULAR | Status: AC
  Filled 2020-12-08: qty 2

## 2020-12-08 MED ORDER — LIDOCAINE HCL (PF) 1 % IJ SOLN
INTRAMUSCULAR | Status: DC | PRN
  Administered 2020-12-08: 10 mL via EPIDURAL
  Administered 2020-12-08: 2 mL via EPIDURAL

## 2020-12-08 MED ORDER — MORPHINE SULFATE (PF) 0.5 MG/ML IJ SOLN
INTRAMUSCULAR | Status: DC | PRN
  Administered 2020-12-08: 3 mg via EPIDURAL

## 2020-12-08 MED ORDER — DIPHENHYDRAMINE HCL 50 MG/ML IJ SOLN: 12.5000 mg | INTRAMUSCULAR | Status: DC | PRN

## 2020-12-08 MED ORDER — ZOLPIDEM TARTRATE 5 MG PO TABS: 5.0000 mg | ORAL_TABLET | Freq: Every evening | ORAL | Status: DC | PRN

## 2020-12-08 MED ORDER — DIBUCAINE (PERIANAL) 1 % EX OINT: 1.0000 "application " | TOPICAL_OINTMENT | CUTANEOUS | Status: DC | PRN

## 2020-12-08 MED ORDER — LACTATED RINGERS IV SOLN: INTRAVENOUS | Status: DC

## 2020-12-08 MED ORDER — FENTANYL CITRATE (PF) 100 MCG/2ML IJ SOLN
INTRAMUSCULAR | Status: AC
  Filled 2020-12-08: qty 2

## 2020-12-08 MED ORDER — MEASLES, MUMPS & RUBELLA VAC IJ SOLR: 0.5000 mL | Freq: Once | INTRAMUSCULAR | Status: DC

## 2020-12-08 MED ORDER — LIDOCAINE-EPINEPHRINE (PF) 2 %-1:200000 IJ SOLN
INTRAMUSCULAR | Status: DC | PRN
  Administered 2020-12-08: 3 mL via PERINEURAL
  Administered 2020-12-08: 10 mL via PERINEURAL
  Administered 2020-12-08: 2 mL via PERINEURAL

## 2020-12-08 MED ORDER — WITCH HAZEL-GLYCERIN EX PADS: 1.0000 "application " | MEDICATED_PAD | CUTANEOUS | Status: DC | PRN

## 2020-12-08 MED ORDER — FENTANYL-BUPIVACAINE-NACL 0.5-0.125-0.9 MG/250ML-% EP SOLN
EPIDURAL | Status: AC
  Filled 2020-12-08: qty 250

## 2020-12-08 MED ORDER — OXYCODONE HCL 5 MG/5ML PO SOLN: 5.0000 mg | Freq: Once | ORAL | Status: DC | PRN

## 2020-12-08 MED ORDER — CEFAZOLIN SODIUM-DEXTROSE 2-3 GM-%(50ML) IV SOLR
INTRAVENOUS | Status: DC | PRN
  Administered 2020-12-08: 2 g via INTRAVENOUS

## 2020-12-08 MED ORDER — HYDROMORPHONE HCL 1 MG/ML IJ SOLN: 0.2500 mg | INTRAMUSCULAR | Status: DC | PRN

## 2020-12-08 MED ORDER — OXYTOCIN-SODIUM CHLORIDE 30-0.9 UT/500ML-% IV SOLN
2.5000 [IU]/h | INTRAVENOUS | Status: AC
  Administered 2020-12-08: 2.5 [IU]/h via INTRAVENOUS
  Filled 2020-12-08: qty 500

## 2020-12-08 MED ORDER — CEFAZOLIN SODIUM-DEXTROSE 2-4 GM/100ML-% IV SOLN: 2.0000 g | INTRAVENOUS | Status: DC

## 2020-12-08 MED ORDER — DEXAMETHASONE SODIUM PHOSPHATE 10 MG/ML IJ SOLN
INTRAMUSCULAR | Status: AC
  Filled 2020-12-08: qty 1

## 2020-12-08 MED ORDER — DIPHENHYDRAMINE HCL 25 MG PO CAPS: 25.0000 mg | ORAL_CAPSULE | ORAL | Status: DC | PRN

## 2020-12-08 MED ORDER — FENTANYL CITRATE (PF) 100 MCG/2ML IJ SOLN: INTRAMUSCULAR | Status: DC | PRN

## 2020-12-08 MED ORDER — SODIUM CHLORIDE 0.9% FLUSH: 3.0000 mL | INTRAVENOUS | Status: DC | PRN

## 2020-12-08 MED ORDER — SIMETHICONE 80 MG PO CHEW: 80.0000 mg | CHEWABLE_TABLET | ORAL | Status: DC | PRN

## 2020-12-08 MED ORDER — MENTHOL 3 MG MT LOZG: 1.0000 | LOZENGE | OROMUCOSAL | Status: DC | PRN

## 2020-12-08 MED ORDER — OXYTOCIN BOLUS FROM INFUSION: 333.0000 mL | Freq: Once | INTRAVENOUS | Status: DC

## 2020-12-08 MED ORDER — PRENATAL MULTIVITAMIN CH
1.0000 | ORAL_TABLET | Freq: Every day | ORAL | Status: DC
  Administered 2020-12-09 – 2020-12-10 (×2): 1 via ORAL
  Filled 2020-12-08 (×2): qty 1

## 2020-12-08 MED ORDER — SODIUM CHLORIDE 0.9 % IR SOLN
Status: DC | PRN
  Administered 2020-12-08: 1

## 2020-12-08 MED ORDER — MEPERIDINE HCL 25 MG/ML IJ SOLN: 6.2500 mg | INTRAMUSCULAR | Status: DC | PRN

## 2020-12-08 MED ORDER — SENNOSIDES-DOCUSATE SODIUM 8.6-50 MG PO TABS
2.0000 | ORAL_TABLET | Freq: Every day | ORAL | Status: DC
  Administered 2020-12-09 – 2020-12-10 (×2): 2 via ORAL
  Filled 2020-12-08 (×2): qty 2

## 2020-12-08 MED ORDER — PHENYLEPHRINE 40 MCG/ML (10ML) SYRINGE FOR IV PUSH (FOR BLOOD PRESSURE SUPPORT): 80.0000 ug | PREFILLED_SYRINGE | INTRAVENOUS | Status: DC | PRN

## 2020-12-08 MED ORDER — ACETAMINOPHEN 500 MG PO TABS: 1000.0000 mg | ORAL_TABLET | Freq: Four times a day (QID) | ORAL | Status: DC

## 2020-12-08 MED ORDER — NALBUPHINE HCL 10 MG/ML IJ SOLN: 5.0000 mg | INTRAMUSCULAR | Status: DC | PRN

## 2020-12-08 MED ORDER — FENTANYL-BUPIVACAINE-NACL 0.5-0.125-0.9 MG/250ML-% EP SOLN: 12.0000 mL/h | EPIDURAL | Status: DC | PRN

## 2020-12-08 MED ORDER — GABAPENTIN 100 MG PO CAPS
300.0000 mg | ORAL_CAPSULE | Freq: Two times a day (BID) | ORAL | Status: DC
  Administered 2020-12-09 – 2020-12-10 (×3): 300 mg via ORAL
  Filled 2020-12-08 (×4): qty 3

## 2020-12-08 MED ORDER — ACETAMINOPHEN 500 MG PO TABS
1000.0000 mg | ORAL_TABLET | Freq: Four times a day (QID) | ORAL | Status: DC
  Administered 2020-12-08 – 2020-12-10 (×7): 1000 mg via ORAL
  Filled 2020-12-08 (×10): qty 2

## 2020-12-08 MED ORDER — KETOROLAC TROMETHAMINE 30 MG/ML IJ SOLN: 30.0000 mg | Freq: Once | INTRAMUSCULAR | Status: DC | PRN

## 2020-12-08 MED ORDER — SODIUM CHLORIDE 0.9 % IV SOLN
INTRAVENOUS | Status: AC
  Filled 2020-12-08: qty 500

## 2020-12-08 MED ORDER — OXYTOCIN-SODIUM CHLORIDE 30-0.9 UT/500ML-% IV SOLN: 2.5000 [IU]/h | INTRAVENOUS | Status: DC

## 2020-12-08 MED ORDER — KETOROLAC TROMETHAMINE 30 MG/ML IJ SOLN
30.0000 mg | Freq: Four times a day (QID) | INTRAMUSCULAR | Status: AC
  Administered 2020-12-08 (×2): 30 mg via INTRAVENOUS
  Filled 2020-12-08 (×4): qty 1

## 2020-12-08 MED ORDER — OXYCODONE HCL 5 MG PO TABS
5.0000 mg | ORAL_TABLET | ORAL | Status: DC | PRN
  Administered 2020-12-09: 5 mg via ORAL
  Filled 2020-12-08: qty 1

## 2020-12-08 MED ORDER — KETOROLAC TROMETHAMINE 30 MG/ML IJ SOLN: 30.0000 mg | Freq: Four times a day (QID) | INTRAMUSCULAR | Status: DC | PRN

## 2020-12-08 MED ORDER — DIPHENHYDRAMINE HCL 25 MG PO CAPS: 25.0000 mg | ORAL_CAPSULE | Freq: Four times a day (QID) | ORAL | Status: DC | PRN

## 2020-12-08 MED ORDER — SOD CITRATE-CITRIC ACID 500-334 MG/5ML PO SOLN
30.0000 mL | ORAL | Status: DC | PRN
  Administered 2020-12-08: 30 mL via ORAL
  Filled 2020-12-08: qty 30

## 2020-12-08 MED ORDER — IBUPROFEN 600 MG PO TABS
600.0000 mg | ORAL_TABLET | Freq: Four times a day (QID) | ORAL | Status: DC
  Administered 2020-12-09 – 2020-12-10 (×5): 600 mg via ORAL
  Filled 2020-12-08 (×7): qty 1

## 2020-12-08 MED ORDER — NALOXONE HCL 0.4 MG/ML IJ SOLN: 0.4000 mg | INTRAMUSCULAR | Status: DC | PRN

## 2020-12-08 MED ORDER — COCONUT OIL OIL: 1.0000 "application " | TOPICAL_OIL | Status: DC | PRN

## 2020-12-08 MED ORDER — ONDANSETRON HCL 4 MG/2ML IJ SOLN
4.0000 mg | Freq: Three times a day (TID) | INTRAMUSCULAR | Status: DC | PRN
  Administered 2020-12-08: 4 mg via INTRAVENOUS
  Filled 2020-12-08: qty 2

## 2020-12-08 MED ORDER — FENTANYL CITRATE (PF) 100 MCG/2ML IJ SOLN
INTRAMUSCULAR | Status: DC | PRN
  Administered 2020-12-08: 100 ug via EPIDURAL

## 2020-12-08 MED ORDER — PROMETHAZINE HCL 25 MG/ML IJ SOLN: 6.2500 mg | INTRAMUSCULAR | Status: DC | PRN

## 2020-12-08 MED ORDER — MAGNESIUM HYDROXIDE 400 MG/5ML PO SUSP: 30.0000 mL | ORAL | Status: DC | PRN

## 2020-12-08 MED ORDER — STERILE WATER FOR IRRIGATION IR SOLN
Status: DC | PRN
  Administered 2020-12-08: 1

## 2020-12-08 MED ORDER — SODIUM CHLORIDE 0.9 % IV SOLN: INTRAVENOUS | Status: DC | PRN

## 2020-12-08 MED ORDER — ACETAMINOPHEN 325 MG PO TABS: 650.0000 mg | ORAL_TABLET | ORAL | Status: DC | PRN

## 2020-12-08 MED ORDER — NALOXONE HCL 4 MG/10ML IJ SOLN
1.0000 ug/kg/h | INTRAVENOUS | Status: DC | PRN
  Filled 2020-12-08: qty 5

## 2020-12-08 MED ORDER — TETANUS-DIPHTH-ACELL PERTUSSIS 5-2.5-18.5 LF-MCG/0.5 IM SUSY: 0.5000 mL | PREFILLED_SYRINGE | Freq: Once | INTRAMUSCULAR | Status: DC

## 2020-12-08 MED ORDER — OXYTOCIN-SODIUM CHLORIDE 30-0.9 UT/500ML-% IV SOLN
INTRAVENOUS | Status: DC | PRN
  Administered 2020-12-08: 30 [IU] via INTRAVENOUS

## 2020-12-08 MED ORDER — LIDOCAINE HCL (PF) 1 % IJ SOLN: 30.0000 mL | INTRAMUSCULAR | Status: DC | PRN

## 2020-12-08 MED ORDER — OXYCODONE-ACETAMINOPHEN 5-325 MG PO TABS
2.0000 | ORAL_TABLET | ORAL | Status: DC | PRN
  Administered 2020-12-10: 2 via ORAL
  Filled 2020-12-08: qty 2

## 2020-12-08 MED ORDER — OXYCODONE-ACETAMINOPHEN 5-325 MG PO TABS
1.0000 | ORAL_TABLET | ORAL | Status: DC | PRN
Start: 2020-12-08 — End: 2020-12-08

## 2020-12-08 MED ORDER — HYDROMORPHONE HCL 1 MG/ML IJ SOLN: 1.0000 mg | INTRAMUSCULAR | Status: DC | PRN

## 2020-12-08 MED ORDER — OXYCODONE-ACETAMINOPHEN 5-325 MG PO TABS: 2.0000 | ORAL_TABLET | ORAL | Status: DC | PRN

## 2020-12-08 MED ORDER — MORPHINE SULFATE (PF) 0.5 MG/ML IJ SOLN
INTRAMUSCULAR | Status: AC
  Filled 2020-12-08: qty 10

## 2020-12-08 MED ORDER — LACTATED RINGERS IV SOLN
500.0000 mL | Freq: Once | INTRAVENOUS | Status: AC
  Administered 2020-12-08: 500 mL via INTRAVENOUS

## 2020-12-08 MED ORDER — OXYCODONE HCL 5 MG PO TABS: 5.0000 mg | ORAL_TABLET | Freq: Once | ORAL | Status: DC | PRN

## 2020-12-08 MED ORDER — SODIUM CHLORIDE 0.9 % IV SOLN: 500.0000 mg | INTRAVENOUS | Status: DC

## 2020-12-08 SURGICAL SUPPLY — 31 items
CHLORAPREP W/TINT 26ML (MISCELLANEOUS) ×2 IMPLANT
CLAMP CORD UMBIL (MISCELLANEOUS) IMPLANT
CLOTH BEACON ORANGE TIMEOUT ST (SAFETY) ×2 IMPLANT
DRSG OPSITE POSTOP 4X10 (GAUZE/BANDAGES/DRESSINGS) ×2 IMPLANT
ELECT REM PT RETURN 9FT ADLT (ELECTROSURGICAL) ×2
ELECTRODE REM PT RTRN 9FT ADLT (ELECTROSURGICAL) ×1 IMPLANT
EXTRACTOR VACUUM M CUP 4 TUBE (SUCTIONS) IMPLANT
GAUZE SPONGE 4X4 12PLY STRL LF (GAUZE/BANDAGES/DRESSINGS) ×2 IMPLANT
GLOVE BIOGEL PI IND STRL 7.0 (GLOVE) ×3 IMPLANT
GLOVE BIOGEL PI INDICATOR 7.0 (GLOVE) ×3
GLOVE ECLIPSE 7.0 STRL STRAW (GLOVE) ×2 IMPLANT
GOWN STRL REUS W/TWL LRG LVL3 (GOWN DISPOSABLE) ×4 IMPLANT
KIT ABG SYR 3ML LUER SLIP (SYRINGE) IMPLANT
NDL HYPO 25X5/8 SAFETYGLIDE (NEEDLE) ×1 IMPLANT
NEEDLE HYPO 22GX1.5 SAFETY (NEEDLE) ×2 IMPLANT
NEEDLE HYPO 25X5/8 SAFETYGLIDE (NEEDLE) ×2 IMPLANT
NS IRRIG 1000ML POUR BTL (IV SOLUTION) ×2 IMPLANT
PACK C SECTION WH (CUSTOM PROCEDURE TRAY) ×2 IMPLANT
PAD ABD 7.5X8 STRL (GAUZE/BANDAGES/DRESSINGS) ×2 IMPLANT
PAD OB MATERNITY 4.3X12.25 (PERSONAL CARE ITEMS) ×2 IMPLANT
PENCIL SMOKE EVAC W/HOLSTER (ELECTROSURGICAL) ×2 IMPLANT
RTRCTR C-SECT PINK 25CM LRG (MISCELLANEOUS) ×1 IMPLANT
SUT PDS AB 0 CTX 36 PDP370T (SUTURE) IMPLANT
SUT PLAIN 2 0 XLH (SUTURE) IMPLANT
SUT VIC AB 0 CTX 36 (SUTURE) ×4
SUT VIC AB 0 CTX36XBRD ANBCTRL (SUTURE) ×2 IMPLANT
SUT VIC AB 4-0 KS 27 (SUTURE) ×2 IMPLANT
SYR CONTROL 10ML LL (SYRINGE) ×2 IMPLANT
TOWEL OR 17X24 6PK STRL BLUE (TOWEL DISPOSABLE) ×2 IMPLANT
TRAY FOLEY W/BAG SLVR 14FR LF (SET/KITS/TRAYS/PACK) ×2 IMPLANT
WATER STERILE IRR 1000ML POUR (IV SOLUTION) ×2 IMPLANT

## 2020-12-08 NOTE — MAU Note (Signed)
..  Tracy [name] is a 25 y.o. at [redacted]w[redacted]d here in MAU reporting: contractions since 11:30pm that are now every 3 minutes. Denies leaking of fluid. Reports some bloody show. +FM 2cm on Thursday  Pain score: 10/10

## 2020-12-08 NOTE — Anesthesia Postprocedure Evaluation (Signed)
Anesthesia Post Note  Patient: Tracy [name]  Procedure(s) Performed: CESAREAN SECTION     Patient location during evaluation: PACU Anesthesia Type: Epidural Level of consciousness: awake and alert and oriented Pain management: pain level controlled Vital Signs Assessment: post-procedure vital signs reviewed and stable Respiratory status: spontaneous breathing, nonlabored ventilation and respiratory function stable Cardiovascular status: blood pressure returned to baseline and stable Postop Assessment: no headache, no backache, epidural receding and no apparent nausea or vomiting Anesthetic complications: no   No notable events documented.  Last Vitals:  Vitals:   12/08/20 1137 12/08/20 1617  BP: 116/66 120/66  Pulse: (!) 53 (!) 51  Resp: 18 18  Temp: 36.6 C 37.1 C  SpO2: 100% 100%    Last Pain:  Vitals:   12/08/20 1617  TempSrc: Oral  PainSc: 0-No pain   Pain Goal: Patients Stated Pain Goal: 0 (12/08/20 0058)                 [name]

## 2020-12-08 NOTE — Anesthesia Preprocedure Evaluation (Signed)
Anesthesia Evaluation  Patient identified by MRN, date of birth, ID band Patient awake    Reviewed: Allergy & Precautions, Patient's Chart, lab work & pertinent test results  Airway Mallampati: II  TM Distance: >3 FB Neck ROM: Full    Dental no notable dental hx.    Pulmonary former smoker,    Pulmonary exam normal breath sounds clear to auscultation       Cardiovascular negative cardio ROS Normal cardiovascular exam Rhythm:Regular Rate:Normal     Neuro/Psych negative neurological ROS  negative psych ROS   GI/Hepatic negative GI ROS, Neg liver ROS,   Endo/Other  negative endocrine ROS  Renal/GU negative Renal ROS  negative genitourinary   Musculoskeletal negative musculoskeletal ROS (+)   Abdominal   Peds negative pediatric ROS (+)  Hematology  (+) Blood dyscrasia, anemia , hct 30.8, plt 168   Anesthesia Other Findings   Reproductive/Obstetrics (+) Pregnancy                             Anesthesia Physical Anesthesia Plan  ASA: 2  Anesthesia Plan: Epidural   Post-op Pain Management:    Induction:   PONV Risk Score and Plan: 2  Airway Management Planned: Natural Airway  Additional Equipment: None  Intra-op Plan:   Post-operative Plan:   Informed Consent: I have reviewed the patients History and Physical, chart, labs and discussed the procedure including the risks, benefits and alternatives for the proposed anesthesia with the patient or authorized representative who has indicated his/her understanding and acceptance.       Plan Discussed with:   Anesthesia Plan Comments:         Anesthesia Quick Evaluation

## 2020-12-08 NOTE — Discharge Summary (Addendum)
Postpartum Discharge Summary     Patient Name: Tracy [name] DOB: 1995/06/30 MRN: [id number]  Date of admission: 12/08/2020 Delivery date:12/08/2020  Delivering provider: Verita Schneiders A  Date of discharge: 12/10/2020  Admitting diagnosis: Encounter for elective induction of labor [Z34.90] Intrauterine pregnancy: [redacted]w[redacted]d    Secondary diagnosis:  Principal Problem:   S/P cesarean section Active Problems:   Acute blood loss anemia   Labor and delivery indication for care or intervention  Additional problems: none    Discharge diagnosis: Term Pregnancy Delivered and Anemia                                              Post partum procedures:blood transfusion and Nexplanon placement Augmentation: AROM Complications: None  Hospital course: Onset of Labor With Unplanned C/S   25y.o. yo G3P1011 at 438w1das admitted in [facility] 12/08/2020. Patient had a presented at 4.5 cm.  However, fetal tracing remained reassuring, but non reactive.  Patient with c/o LOF and AROM of forebag performed with placement of FSE. Negative Scalp Stimulation and continued minimal to no variability.  Recommendation made for c/s.  The patient went for cesarean section due to Non-Reassuring FHR. Delivery details as follows: Membrane Rupture Time/Date: 3:22 AM ,12/08/2020   Delivery Method:C-Section, Low Transverse  Details of operation can be found in separate operative note. Patient had a postpartum course remarkable for having a POD#1 Hgb of 6.7; she was consented for a blood tx of 1uPRBC with f/u Hgb 8.8.  She is ambulating,tolerating a regular diet, passing flatus, and urinating well on POD#2. She had a Nexplanon placed prior to discharge. Patient is discharged home in stable condition 12/10/20.  Newborn Data: Birth date:12/08/2020  Birth time:4:26 AM  Gender:Female  Living status:Living  Apgars:8 ,9  Weight:3751 g   Magnesium Sulfate received: No BMZ received:  No Rhophylac:N/A MMR:N/A T-DaP:Given prenatally Flu: No Transfusion:Yes  Physical exam  Vitals:   12/09/20 1334 12/09/20 1520 12/09/20 2205 12/10/20 0606  BP: (!) 114/55 110/62 102/69 (!) 97/58  Pulse: 64 (!) 51 (!) 57 (!) 50  Resp: _0 Temp: 98.4 F (36.9 C) 98.4 F (36.9 C) 97.8 F (36.6 C) 98.2 F (36.8 C)  TempSrc: Oral Oral Oral Oral  SpO2: 100% 100% 100% 100%  Weight:      Height:       General: alert and cooperative Lochia: appropriate Uterine Fundus: firm Incision: honeycomb marked and unchanged DVT Evaluation: No evidence of DVT seen on physical exam. Labs: Lab Results  Component Value Date   WBC 11.7 (H) 12/09/2020   HGB 8.8 (L) 12/09/2020   HCT 26.9 (L) 12/09/2020   MCV 94.9 12/09/2020   PLT 137 (L) 12/09/2020   CMP Latest Ref Rng & Units 12/09/2020  Glucose 65 - 99 mg/dL -  BUN 6 - 20 mg/dL -  Creatinine 0.44 - 1.00 mg/dL 0.76  Sodium 135 - 145 mmol/L -  Potassium 3.5 - 5.1 mmol/L -  Chloride 101 - 111 mmol/L -  CO2 22 - 32 mmol/L -  Calcium 8.9 - 10.3 mg/dL -  Total Protein 6.5 - 8.1 g/dL -  Total Bilirubin 0.3 - 1.2 mg/dL -  Alkaline Phos 38 - 126 U/L -  AST 15 - 41 U/L -  ALT 14 - 54 U/L -   Edinburgh Score:  Edinburgh Postnatal Depression Scale Screening Tool 08/10/2017  I have been able to laugh and see the funny side of things. 0  I have looked forward with enjoyment to things. 0  I have blamed myself unnecessarily when things went wrong. 0  I have been anxious or worried for no good reason. 0  I have felt scared or panicky for no good reason. 1  Things have been getting on top of me. 0  I have been so unhappy that I have had difficulty sleeping. 0  I have felt sad or miserable. 0  I have been so unhappy that I have been crying. 0  The thought of harming myself has occurred to me. 0  Edinburgh Postnatal Depression Scale Total 1     After visit meds:  Allergies as of 12/10/2020   No Known Allergies      Medication List      STOP taking these medications    Blood Pressure Kit Devi   famotidine 20 MG tablet Commonly known as: Pepcid   Gojji Weight Scale Misc       TAKE these medications    ferrous sulfate 325 (65 FE) MG tablet Take 1 tablet (325 mg total) by mouth every other day. Start taking on: December 11, 2020   ibuprofen 600 MG tablet Commonly known as: ADVIL Take 1 tablet (600 mg total) by mouth every 6 (six) hours as needed.   oxyCODONE 5 MG immediate release tablet Commonly known as: Oxy IR/ROXICODONE Take 1-2 tablets (5-10 mg total) by mouth every 4 (four) hours as needed for moderate pain.   PrePLUS 27-1 MG Tabs Take 1 tablet by mouth daily.         Discharge home in stable condition Infant Feeding: Bottle Infant Disposition:home with mother Discharge instruction: per After Visit Summary and Postpartum booklet. Activity: Advance as tolerated. Pelvic rest for 6 weeks.  Diet: routine diet Future Appointments: Future Appointments  Date Time Provider Department Center  12/10/2020  9:35 AM Harper, Charles A, MD [facility] None   Follow up Visit:   Please schedule this patient for a Virtual postpartum visit in 4 weeks with the following provider: Any provider. Additional Postpartum F/U:Incision check 1 week  Low risk pregnancy complicated by:  N/A Delivery mode:  Primary C/Viola-Section, Low Transverse  Anticipated Birth Control:  Nexplanon   12/10/2020 Kimberly D Shaw, CNM 8:08 AM     

## 2020-12-08 NOTE — H&P (Signed)
Tracy [name] is a 25 y.o. female, G3P1011 at 40.1 weeks, presenting for contractions. Patient receives care at [facility] and was supervised for a low-risk pregnancy. Pregnancy and medical history significant for problems as listed below. She is GBS Negative and expresses a desire for epidural for pain management.  She is anticipating a female infant and requests Nexplanon for PP birth control method.     Patient Active Problem List   Diagnosis Date Noted   Encounter for elective induction of labor 12/08/2020   Trichomonal vaginitis during pregnancy, antepartum, second trimester 08/14/2020   ASCUS with positive high risk HPV cervical 05/25/2020   Gonorrhea affecting pregnancy 05/21/2020   Chlamydia infection affecting pregnancy 05/21/2020   Supervision of other normal pregnancy, antepartum 04/06/2020   Maternal varicella, non-immune 12/20/2016   Heart murmur previously undiagnosed 10/07/2014    History of present pregnancy:  Last evaluation:  12/03/2020 in office by Dr. Aron Baba BP: 112/67  Pulse: 75  Weight: 162 lb 11.2 oz (73.8 kg)  Cervix: 2/50/-1 Vertex   Nursing Staff Provider  Office Location  [facility] Dating  10wk sono  Language  English Anatomy US  normal  Flu Vaccine  Declined 04/16/20 Genetic Screen  NIPS: low risks   AFP: neg  TDaP Vaccine   09/10/20 Hgb A1C or  GTT Early  Third trimester 80/101/78  COVID Vaccine Vaccinated 07/2019   LAB RESULTS   Rhogam  A+ Blood Type A/Positive/-- (03/08 1448)   Feeding Plan Bottle Antibody Negative (03/08 1448)  Contraception Nexplanon? Rubella 1.41 (03/08 1448)  Circumcision Yes if aboy RPR Non Reactive (06/30 1325)   Pediatrician  [facility] HBsAg Negative (03/08 1448)   Support Person Aunt- Euressa HCVAb neg  Prenatal Classes  HIV Non Reactive (06/30 1325)     BTL Consent n/a GBS Negative/-- (09/01 1024)negative   VBAC Consent n/a Pap ASCUS + HPV 05/2020 - colpo ppartum    Hgb Electro  AA  BP Cuff Ordered CF neg     SMA 3 copies    Waterbirth  [ ]  Class [ ]  Consent [ ]  CNM visit    Induction  [ ]  Orders Entered [ ] Foley Y/N     OB History     Gravida  3   Para  1   Term  1   Preterm      AB  1   Living  1      SAB      IAB  1   Ectopic      Multiple  0   Live Births  1             Past Medical History:  Diagnosis Date   ASCUS with positive high risk HPV cervical 05/25/2020   Trichomonal vaginitis during pregnancy in second trimester 08/13/2020   Past Surgical History:  Procedure Laterality Date   WISDOM TOOTH EXTRACTION     Family History: family history includes Healthy in her father and mother; Hypertension in her maternal aunt; Thyroid disease in her maternal aunt, paternal grandmother, and paternal uncle. Social History:  reports that she has quit smoking. She has never used smokeless tobacco. She reports that she does not drink alcohol and does not use drugs.   Prenatal Transfer Tool  Maternal Diabetes: No Genetic Screening: Normal Maternal Ultrasounds/Referrals: Normal Fetal Ultrasounds or other Referrals:  None Maternal Substance Abuse:  No Significant Maternal Medications:  None Significant Maternal Lab Results: Group B Strep negative   Maternal Assessment:  ROS: +Contractions, -LOF, -Vaginal Bleeding, +Fetal Movement  All other systems reviewed and negative.    No Known Allergies   Dilation: 4.5 Effacement (%): 90 Station: -1 Exam by:: Alondra Blood pressure 113/69, pulse 72, temperature 97.9 F (36.6 C), temperature source Oral, resp. rate 18, height 5\' 5"  (1.651 m), weight 73.5 kg, last menstrual period 02/02/2020, SpO2 97 %.  Physical Exam Vitals reviewed.  Constitutional:      Appearance: Normal appearance.  HENT:     Head: Normocephalic and atraumatic.  Eyes:     Conjunctiva/sclera: Conjunctivae normal.  Cardiovascular:     Rate and Rhythm: Normal rate and regular rhythm.  Pulmonary:     Effort: No respiratory distress.      Breath sounds: Normal breath sounds.  Abdominal:     Palpations: Abdomen is soft.     Comments: Gravid, Appears AGA  Musculoskeletal:        General: Normal range of motion.     Cervical back: Normal range of motion.     Right lower leg: Edema present.     Left lower leg: Edema present.     Comments: Nonpitting edema  Skin:    General: Skin is warm and dry.  Neurological:     Mental Status: She is alert and oriented to person, place, and time.  Psychiatric:        Mood and Affect: Mood normal.        Behavior: Behavior normal.        Thought Content: Thought content normal.    Fetal Assessment: Leopolds: -Pelvis: Proven to 7lbs 3oz -EFW: 7lbs -Presentation: Vertex by Nurse Exam  FHR: 145 bpm, Mod Var, -Decels, -Accels UCs:  Palpates moderate    Assessment IUP at 40.1 weeks Cat I FT Active Labor GBS Negative   Plan: Admit to 02/04/2020  Routine Labor and Delivery Orders per Protocol In room to complete assessment and discuss POC: -Reviewed plan for expectant management. -Discussed will augment if necessary after 4 hours of monitoring. -Okay for epidural as desired. -Reviewed placement of Nexplanon during hospital stay in PPP. -Dr.U.Anyanwu informed of patient pending admission.   [employer], MSN 12/08/2020, 2:07 AM

## 2020-12-08 NOTE — Progress Notes (Addendum)
Tracy [name] MRN: [id number]  Subjective: -Strip reviewed. Minimal to no variability noted. Dr. Macon Large consulted and advises exam with scalp stimulation and possible FSE.  Provider to bedside and reports leaking of fluid for the past 10-15 minutes.  Endorses comfort with epidural.   Objective: BP 117/63   Pulse 69   Temp 97.9 F (36.6 C) (Oral)   Resp 18   Ht 5\' 6"  (1.676 m)   Wt 73.5 kg   LMP 02/02/2020   SpO2 99%   BMI 26.15 kg/m  No intake/output data recorded. No intake/output data recorded.  Fetal Monitoring: FHT: 145 bpm, Min to mod Var, -Decels, -Accels UC: Q2-61min    Vaginal Exam: SVE:   Dilation: 5 Effacement (%): 90 Station: -1 Exam by:: [email or web address], RN Membranes:Forebag noted. AROM Internal Monitors: FSE Placed  Augmentation/Induction: Pitocin:None Cytotec: None  Assessment:  IUP at 40.1 weeks Cat II FT  Early Active Labor NR FHT  Plan: -Discussed r/b of fetal scalp electrode including infection, trauma to fetal scalp, and ability to monitor fetal heart rate more accurately.   -Patient w/o q/c and agrees to placement.  -Upon placement, no response with scalp stimulation noted. -Dr. Mickle Mallory at bedside with recommendation for C/S. -R/B reviewed including, but not limited to, infection, bleeding, pain, damage to organs or fetus resulting in need for additional surgery or blood transfusion.  Patient understands and accepts these risks and wishes to proceed with c/s.   Macon Large, CNM Advanced Practice Provider, Center for [facility] 12/08/2020, 3:12 AM  Attestation of Attending Supervision of Advanced Practice Provider (PA/CNM/NP): Evaluation and management procedures were performed by the Advanced Practice Provider under my supervision and collaboration.  I have reviewed the Advanced Practice Provider's note and chart, and I agree with the management and plan. I have also made any necessary editorial changes.  Persistent  Category II FHR tracing, not reassured by lack of variability, accelerations or scalp stimulation. Discussed concerns with patient, recommended cesarean delivery.  The risks of surgery were discussed with the patient including but were not limited to: bleeding which may require transfusion or reoperation; infection which may require antibiotics; injury to bowel, bladder, ureters or other surrounding organs; injury to the fetus; need for additional procedures including hysterectomy in the event of a life-threatening hemorrhage; formation of adhesions; placental abnormalities with subsequent pregnancies; incisional problems; thromboembolic phenomenon and other postoperative/anesthesia complications.  The patient concurred with the proposed plan, giving informed written consent for the procedure.   Preoperative prophylactic antibiotics and SCDs ordered on call to the OR.  To OR when ready.    12/10/2020, MD, FACOG Attending Obstetrician & Gynecologist, [facility]

## 2020-12-08 NOTE — Transfer of Care (Signed)
Immediate Anesthesia Transfer of Care Note  Patient: Tracy [name]  Procedure(s) Performed: CESAREAN SECTION  Patient Location: PACU  Anesthesia Type:Epidural  Level of Consciousness: awake, alert  and oriented  Airway & Oxygen Therapy: Patient Spontanous Breathing  Post-op Assessment: Report given to RN and Post -op Vital signs reviewed and stable  Post vital signs: Reviewed and stable  Last Vitals:  Vitals Value Taken Time  BP 119/94 12/08/20 0526  Temp 36.6 C 12/08/20 0525  Pulse 88 12/08/20 0530  Resp 15 12/08/20 0530  SpO2 100 % 12/08/20 0530  Vitals shown include unvalidated device data.  Last Pain:  Vitals:   12/08/20 0525  TempSrc: Oral  PainSc: 0-No pain      Patients Stated Pain Goal: 0 (12/08/20 0058)  Complications: No notable events documented.

## 2020-12-08 NOTE — Anesthesia Procedure Notes (Signed)
Epidural Patient location during procedure: OB Start time: 12/08/2020 2:04 AM End time: 12/08/2020 2:14 AM  Staffing Anesthesiologist: [name], DO Performed: anesthesiologist   Preanesthetic Checklist Completed: patient identified, IV checked, risks and benefits discussed, monitors and equipment checked, pre-op evaluation and timeout performed  Epidural Patient position: sitting Prep: DuraPrep and site prepped and draped Patient monitoring: continuous pulse ox, blood pressure, heart rate and cardiac monitor Approach: midline Location: L3-L4 Injection technique: LOR air  Needle:  Needle type: Tuohy  Needle gauge: 17 G Needle length: 9 cm Needle insertion depth: 5.5 cm Catheter type: closed end flexible Catheter size: 19 Gauge Catheter at skin depth: 11 cm Test dose: negative  Assessment Sensory level: T8 Events: blood not aspirated, injection not painful, no injection resistance, no paresthesia and negative IV test  Additional Notes Patient identified. Risks/Benefits/Options discussed with patient including but not limited to bleeding, infection, nerve damage, paralysis, failed block, incomplete pain control, headache, blood pressure changes, nausea, vomiting, reactions to medication both or allergic, itching and postpartum back pain. Confirmed with bedside nurse the patient's most recent platelet count. Confirmed with patient that they are not currently taking any anticoagulation, have any bleeding history or any family history of bleeding disorders. Patient expressed understanding and wished to proceed. All questions were answered. Sterile technique was used throughout the entire procedure. Please see nursing notes for vital signs. Test dose was given through epidural catheter and negative prior to continuing to dose epidural or start infusion. Warning signs of high block given to the patient including shortness of breath, tingling/numbness in hands, complete motor  block, or any concerning symptoms with instructions to call for help. Patient was given instructions on fall risk and not to get out of bed. All questions and concerns addressed with instructions to call with any issues or inadequate analgesia.  Reason for block:procedure for pain

## 2020-12-08 NOTE — Op Note (Signed)
Tracy [name] PROCEDURE DATE: 12/08/2020  PREOPERATIVE DIAGNOSES: Intrauterine pregnancy at [redacted]w[redacted]d weeks gestation; non-reassuring fetal status  POSTOPERATIVE DIAGNOSES: The same  PROCEDURE: Low Transverse Cesarean Section  SURGEON:  Dr. Jaynie Collins  ANESTHESIOLOGY TEAM: Anesthesiologist: [name], DO  INDICATIONS: Tracy [name] is a 25 y.o. G3P1011 at [redacted]w[redacted]d here for cesarean section secondary to the indications listed under preoperative diagnoses; please see preoperative note for further details.  The risks of surgery were discussed with the patient including but were not limited to: bleeding which may require transfusion or reoperation; infection which may require antibiotics; injury to bowel, bladder, ureters or other surrounding organs; injury to the fetus; need for additional procedures including hysterectomy in the event of a life-threatening hemorrhage; formation of adhesions; placental abnormalities wth subsequent pregnancies; incisional problems; thromboembolic phenomenon and other postoperative/anesthesia complications.  The patient concurred with the proposed plan, giving informed written consent for the procedure.    FINDINGS:  Viable female infant in cephalic presentation.  Apgars 8 and 9.  Moderate meconium noted in amniotic fluid.  Intact placenta, three vessel cord; cord noted to very thin with very little Wharton's jelly.  Normal uterus, fallopian tubes and ovaries bilaterally.  ANESTHESIA: Epidural  ESTIMATED BLOOD LOSS: 971 ml SPECIMENS: Placenta sent to pathology COMPLICATIONS: None immediate  PROCEDURE IN DETAIL:  The patient preoperatively received intravenous antibiotics and had sequential compression devices applied to her lower extremities.  She was then taken to the operating room where spinal anesthesia was administered and was found to be adequate. She was then placed in a dorsal supine position with a leftward tilt, and prepped and draped in  a sterile manner.  A foley catheter was placed into her bladder and attached to constant gravity.  After an adequate timeout was performed, a Pfannenstiel skin incision was made with scalpel and carried through to the underlying layer of fascia. The fascia was incised in the midline, and this incision was extended bluntly. The underlying rectus muscles were dissected off the fascia bluntly.   The rectus muscles were separated in the midline and the peritoneum was entered bluntly. The Alexis self-retaining retractor was introduced into the abdominal cavity.  Attention was turned to the lower uterine segment where a low transverse hysterotomy was made with a scalpel and extended bilaterally bluntly.  The infant was successfully delivered, the cord was clamped and cut after one minute, and the infant was handed over to the awaiting neonatology team. Uterine massage was then administered, and the placenta delivered intact with a three-vessel cord. The uterus was then cleared of clots and debris.  The hysterotomy was closed with 0 Vicryl in a running locked fashion, and an imbricating layer was also placed with 0 Vicryl.  The pelvis was cleared of all clot and debris. Hemostasis was confirmed on all surfaces.  The retractor was removed.  The peritoneum was closed with a 0 Vicryl running stitch. The fascia was then closed using 0 Vicryl in a running fashion.  The subcutaneous layer was irrigated, reapproximated with 2-0 plain gut interrupted stitches, and the skin was closed with a 4-0 Vicryl subcuticular stitch. The patient tolerated the procedure well. Sponge, instrument and needle counts were correct x 3.  She was taken to the recovery room in stable condition.    Jaynie Collins, MD, FACOG Obstetrician & Gynecologist, [facility] for [employer], [facility]

## 2020-12-09 LAB — CBC
HCT: 20.4 % — ABNORMAL LOW (ref 36.0–46.0)
Hemoglobin: 6.7 g/dL — CL (ref 12.0–15.0)
MCH: 31.2 pg (ref 26.0–34.0)
MCHC: 32.8 g/dL (ref 30.0–36.0)
MCV: 94.9 fL (ref 80.0–100.0)
Platelets: 137 10*3/uL — ABNORMAL LOW (ref 150–400)
RBC: 2.15 MIL/uL — ABNORMAL LOW (ref 3.87–5.11)
RDW: 13.4 % (ref 11.5–15.5)
WBC: 11.7 10*3/uL — ABNORMAL HIGH (ref 4.0–10.5)
nRBC: 0 % (ref 0.0–0.2)

## 2020-12-09 LAB — HEMOGLOBIN AND HEMATOCRIT, BLOOD
HCT: 26.9 % — ABNORMAL LOW (ref 36.0–46.0)
Hemoglobin: 8.8 g/dL — ABNORMAL LOW (ref 12.0–15.0)

## 2020-12-09 LAB — CREATININE, SERUM
Creatinine, Ser: 0.76 mg/dL (ref 0.44–1.00)
GFR, Estimated: >60 mL/min (ref >60)

## 2020-12-09 LAB — SURGICAL PATHOLOGY

## 2020-12-09 LAB — PREPARE RBC (CROSSMATCH)

## 2020-12-09 MED ORDER — SODIUM CHLORIDE 0.9% IV SOLUTION: Freq: Once | INTRAVENOUS | Status: AC

## 2020-12-09 NOTE — Progress Notes (Addendum)
POSTPARTUM PROGRESS NOTE  Post Partum Day 1  Subjective:  Tracy [name] is a 25 y.o. [id number] s/p LTCS at [redacted]w[redacted]d.  No acute events overnight.  Pt denies problems with ambulating, voiding or po intake.  She denies headaches, N/V.  Pain is being controlled by Percocet PRN.  She has had flatus. She has not had bowel movement.  Lochia Small.   Objective: Blood pressure (!) 103/43, pulse 63, temperature 98 F (36.7 C), temperature source Oral, resp. rate 16, height 5\' 6"  (1.676 m), weight 73.5 kg, last menstrual period 02/02/2020, SpO2 100 %, unknown if currently breastfeeding.  Physical Exam:  General: alert, cooperative and no distress Chest: no respiratory distress Abdomen: soft, tender on palpation at LLQ  Uterine Fundus: firm, appropriately tender DVT Evaluation: No calf swelling or tenderness Extremities: no edema Skin: warm, dry;  Incision clean/dry/intact  Recent Labs    12/08/20 0117 12/09/20 0420  HGB 10.1* 6.7*  HCT 30.8* 20.4*    Assessment/Plan: Tracy [name] is a 25 y.o. 22 s/p LTCS at [redacted]w[redacted]d   PPD#1 - Doing well Contraception: desires Nexplanon Circumcision: yes   Feeding: bottle Dispo: Plan to stay for 1-2 days   #Acute blood loss anemia  Pt is asymptomatic and well-appearing but had a Hb < 7.0 this morning.   -Order 1 unit of RBC. Recheck Hb level   LOS: 1 day   [redacted]w[redacted]d, Medical Student 12/09/2020, 7:05 AM    Midwife attestation Post Partum/Postop Day 1 I have seen and examined this patient and agree with above documentation in the medical student's note.   Tracy [name] is a 25 y.o. 22 s/p LTCS.  Pt denies problems with ambulating, voiding or po intake. Pain is well controlled.  Plan for birth control is  Nexplanon prior to hospital discharge .  Method of Feeding: bottle  PE:  BP (!) 103/43 (BP Location: Right Arm)   Pulse 63   Temp 98 F (36.7 C) (Oral)   Resp 16   Ht 5\' 6"  (1.676 m)   Wt 162 lb (73.5  kg)   LMP 02/02/2020   SpO2 100%   Breastfeeding Unknown   BMI 26.15 kg/m  Gen: well appearing Heart: reg rate Lungs: normal WOB Fundus firm Ext: soft, no pain, no edema  Plan for discharge: 9/29 or 9/30  10/29, CNM 9:14 AM

## 2020-12-09 NOTE — Plan of Care (Signed)
Care Plan progressing  Velmer Woelfel T RN  

## 2020-12-10 ENCOUNTER — Inpatient Hospital Stay (HOSPITAL_COMMUNITY)
Admission: AD | Admit: 2020-12-10 | Payer: Medicaid Other | Source: Ambulatory Visit | Admitting: Obstetrics and Gynecology

## 2020-12-10 ENCOUNTER — Encounter: Payer: Medicaid Other | Admitting: Obstetrics

## 2020-12-10 DIAGNOSIS — Z30017 Encounter for initial prescription of implantable subdermal contraceptive: Secondary | ICD-10-CM | POA: Diagnosis not present

## 2020-12-10 DIAGNOSIS — D62 Acute posthemorrhagic anemia: Secondary | ICD-10-CM | POA: Diagnosis not present

## 2020-12-10 LAB — TYPE AND SCREEN
ABO/RH(D): A POS
Antibody Screen: NEGATIVE
Unit division: 0

## 2020-12-10 LAB — BPAM RBC
Blood Product Expiration Date: 202210272359
ISSUE DATE / TIME: 202209281308
Unit Type and Rh: 6200

## 2020-12-10 MED ORDER — ETONOGESTREL 68 MG ~~LOC~~ IMPL
68.0000 mg | DRUG_IMPLANT | Freq: Once | SUBCUTANEOUS | Status: AC
  Administered 2020-12-10: 68 mg via SUBCUTANEOUS
  Filled 2020-12-10: qty 1

## 2020-12-10 MED ORDER — IBUPROFEN 600 MG PO TABS: 600.0000 mg | ORAL_TABLET | Freq: Four times a day (QID) | ORAL | 0 refills | Status: DC | PRN

## 2020-12-10 MED ORDER — FERROUS SULFATE 325 (65 FE) MG PO TABS: 325.0000 mg | ORAL_TABLET | ORAL | 3 refills | Status: AC

## 2020-12-10 MED ORDER — LIDOCAINE HCL 1 % IJ SOLN
0.0000 mL | Freq: Once | INTRAMUSCULAR | Status: AC | PRN
  Administered 2020-12-10: 3 mL via INTRADERMAL
  Filled 2020-12-10: qty 20

## 2020-12-10 MED ORDER — OXYCODONE HCL 5 MG PO TABS: 5.0000 mg | ORAL_TABLET | ORAL | 0 refills | Status: DC | PRN

## 2020-12-10 NOTE — Plan of Care (Signed)
Care plan progressing  Nahum Sherrer RN  

## 2020-12-10 NOTE — Progress Notes (Signed)
POSTPARTUM PROGRESS NOTE  Post Partum Day 2  Subjective:  Tracy [name] is a 25 y.o. [id number] s/p LTCS at [redacted]w[redacted]d.  No acute events overnight.  Pt denies problems with ambulating, voiding or po intake.  She denies headaches, N/V.  Pain is being controlled by Percocet PRN.  She has had flatus. She has not had bowel movement.  Lochia Small.   Objective: Blood pressure (!) 97/58, pulse (!) 50, temperature 98.2 F (36.8 C), temperature source Oral, resp. rate 18, height 5\' 6"  (1.676 m), weight 73.5 kg, last menstrual period 02/02/2020, SpO2 100 %, unknown if currently breastfeeding.  Physical Exam:  General: alert, cooperative and no distress Chest: no respiratory distress Abdomen: soft, tender on palpation at LLQ  Uterine Fundus: firm, appropriately tender DVT Evaluation: No calf swelling or tenderness Extremities: no edema Skin: warm, dry  Incision: clean/dry/intact  Recent Labs    12/09/20 0420 12/09/20 1759  HGB 6.7* 8.8*  HCT 20.4* 26.9*   Assessment/Plan: HIILANI JETTER is a 25 y.o. 22 s/p LTCS at [redacted]w[redacted]d   PPD#1 - Doing well Contraception: desires IP Nexplanon, pt has Medicaid Circumcision: yes, needs consented   Feeding: bottle Dispo: Plan for discharge    #Acute blood loss anemia  Pt is fatigued but well-appearing. Hb was 6.7 yesterday and received a 1 unit of pRBC. Hb now at 8.8.   -Cont. scheduled oral Fe every other day    LOS: 2 days   [redacted]w[redacted]d, Medical Student 12/10/2020, 7:30 AM

## 2020-12-10 NOTE — Procedures (Signed)
Post-Placental Nexplanon Insertion Procedure Note  Patient was identified. Informed consent was signed, signed copy in chart. A time-out was performed.    The insertion site was identified 8-10 cm (3-4 inches) from the medial epicondyle of the humerus and 3-5 cm (1.25-2 inches) posterior to (below) the sulcus (groove) between the biceps and triceps muscles of the patient's left arm and marked. The site was prepped and draped in the usual sterile fashion. Pt was prepped with alcohol swab and then injected with 3 cc of 1% lidocaine.  The site was prepped with betadine. Nexplanon removed form packaging.  Device confirmed in needle, then inserted full length of needle and withdrawn per handbook instructions. Provider and patient verified presence of the implant in the woman's arm by palpation. Pt insertion site was covered with steristrips/adhesive bandage and pressure bandage. There was minimal blood loss. Patient tolerated procedure well.  Patient was given post procedure instructions and Nexplanon user card with expiration date. Condoms were recommended for STI prevention. Patient was asked to keep the pressure dressing on for 24 hours to minimize bruising and keep the adhesive bandage on for 3-5 days. The patient verbalized understanding of the plan of care and agrees.   Lot # [id number] Expiration Date 10/24/2022  Evalina Field, MD

## 2020-12-15 ENCOUNTER — Ambulatory Visit (INDEPENDENT_AMBULATORY_CARE_PROVIDER_SITE_OTHER): Payer: Medicaid Other

## 2020-12-15 ENCOUNTER — Other Ambulatory Visit: Payer: Self-pay

## 2020-12-15 VITALS — BP 113/69 | HR 62 | Wt 140.6 lb

## 2020-12-15 DIAGNOSIS — Z5189 Encounter for other specified aftercare: Secondary | ICD-10-CM

## 2020-12-15 NOTE — Progress Notes (Signed)
Pt is in the office for incision check following a c section on 12-08-2020. Pt denies any abnormal symptoms. Honeycomb dressing was removed today and incision appears to be dry, clean and intact with no signs of infection.  Advised pt to keep incision clean and dry at home and report any abnormal symptoms, pt agreed.  PP visit is scheduled for 01/13/21

## 2020-12-15 NOTE — Progress Notes (Signed)
Patient was assessed and managed by nursing staff during this encounter. I have reviewed the chart and agree with the documentation and plan. I have also made any necessary editorial changes.  Warden Fillers, MD 12/15/2020 12:21 PM

## 2020-12-16 ENCOUNTER — Telehealth (HOSPITAL_COMMUNITY): Payer: Self-pay | Admitting: *Deleted

## 2020-12-16 NOTE — Telephone Encounter (Signed)
Patient voiced no questions or concerns regarding her own health. EPDS = 2. Patient asked about a red area in infant's eye. Stated that it was present at birth. Has not gotten any bigger, but does not appear to be resolving, per Patient report. Stated that infant has a pediatrician appointment scheduled for Monday, 10/10. RN instructed patient to have MD evaluated infant's eye at that time. Patient  voiced no other questions or concerns regarding baby at this time. Patient reported infant sleeps in a bassinet on his back. RN reviewed ABCs of safe sleep - patient verbalized understanding. Patient requested RN email information on hospital's virtual Baby and Me class. Email sent. Deforest Hoyles, RN 12/16/20, [phone].

## 2021-01-12 NOTE — Progress Notes (Signed)
    Post Partum MyChart Virtual Visit Note  Tracy [name] is a 25 y.o. G24P2012 female who presents for a virtual postpartum visit. She is 4 weeks postpartum following a cesarean section for non reassuring FHR.  I have fully reviewed the prenatal and intrapartum course. The delivery was at 40 gestational weeks.  Anesthesia: epidural Postpartum course has been normal. Baby is doing well. Baby is feeding by  bottle . Bleeding no bleeding. Bowel function is normal. Bladder function is normal. Patient is not sexually active. Contraception method is Nexplanon. Postpartum depression screening: negative.   The pregnancy intention screening data noted above was reviewed. Potential methods of contraception were discussed. The patient elected to proceed with No data recorded.    Health Maintenance Due  Topic Date Due   HPV VACCINES (1 - 2-dose series) Never done   COVID-19 Vaccine (2 - Mixed Product series) 08/10/2019   INFLUENZA VACCINE  10/12/2020    The following portions of the patient's history were reviewed and updated as appropriate: allergies, current medications, past family history, past medical history, past social history, past surgical history, and problem list.  Review of Systems A comprehensive review of systems was negative.  Objective:  There were no vitals taken for this visit.   General:  alert and no distress   Breasts:  normal  Lungs:  Normal respiratory effort  Heart:   RRR  The remainder of the physical exam was deferred due to the type of encounter   Assessment:   1. Postpartum care following cesarean delivery - doing well  2. S/P cesarean section  3. Encounter for surveillance of implantable subdermal contraceptive - pleased with Nexplanon   Plan:   Essential components of care per ACOG recommendations:  1.  Mood and well being: Patient with negative depression screening today. Reviewed local resources for support.  - Patient tobacco use? No.   -  hx of drug use? No.    2. Infant care and feeding:  -Patient currently breastmilk feeding? No.  -Social determinants of health (SDOH) reviewed in EPIC. No concern  3. Sexuality, contraception and birth spacing - Patient does not want a pregnancy in the next year.  Desired family size is 2 children.  - Reviewed forms of contraception in tiered fashion. Patient desired  Nexplanon  today.   - Discussed birth spacing of 18 months  4. Sleep and fatigue -Encouraged family/partner/community support of 4 hrs of uninterrupted sleep to help with mood and fatigue  5. Physical Recovery  - Discussed patients delivery and complications. She describes her labor as mixed. - Patient had a C-section, no problems at delivery.  - Patient has urinary incontinence? No. - Patient is not safe to resume physical and sexual activity  6.  Health Maintenance - HM due items addressed Yes - Last pap smear  Diagnosis  Date Value Ref Range Status  05/19/2020 (A)  Final   - Atypical squamous cells of undetermined significance (ASC-US)   Pap smear not done at today's visit.  Will need colposcopy at 3-4 months postpartum -Breast Cancer screening indicated? No.   7. Chronic Disease/Pregnancy Condition follow up: None   Marcellino Fidalgo A Cheree Ditto, [facility] for [employer], [facility]  01/13/21

## 2021-01-13 ENCOUNTER — Encounter: Payer: Self-pay | Admitting: Obstetrics

## 2021-01-13 ENCOUNTER — Telehealth (INDEPENDENT_AMBULATORY_CARE_PROVIDER_SITE_OTHER): Payer: Medicaid Other | Admitting: Obstetrics

## 2021-01-13 DIAGNOSIS — Z3046 Encounter for surveillance of implantable subdermal contraceptive: Secondary | ICD-10-CM | POA: Diagnosis not present

## 2021-01-13 DIAGNOSIS — Z98891 History of uterine scar from previous surgery: Secondary | ICD-10-CM

## 2021-12-02 ENCOUNTER — Ambulatory Visit: Payer: Medicaid Other | Admitting: Obstetrics and Gynecology

## 2022-01-17 ENCOUNTER — Encounter (HOSPITAL_COMMUNITY): Payer: Self-pay

## 2022-01-17 ENCOUNTER — Ambulatory Visit (HOSPITAL_COMMUNITY)
Admission: EM | Admit: 2022-01-17 | Discharge: 2022-01-17 | Disposition: A | Discharge status: Home / Self Care | Payer: Medicaid Other | Attending: Physician Assistant | Admitting: Physician Assistant

## 2022-01-17 DIAGNOSIS — R110 Nausea: Secondary | ICD-10-CM | POA: Insufficient documentation

## 2022-01-17 DIAGNOSIS — J069 Acute upper respiratory infection, unspecified: Secondary | ICD-10-CM | POA: Insufficient documentation

## 2022-01-17 DIAGNOSIS — Z1152 Encounter for screening for COVID-19: Secondary | ICD-10-CM | POA: Insufficient documentation

## 2022-01-17 LAB — RESP PANEL BY RT-PCR (FLU A&B, COVID) ARPGX2
Influenza A by PCR: POSITIVE — AB
Influenza B by PCR: NEGATIVE
SARS Coronavirus 2 by RT PCR: NEGATIVE

## 2022-01-17 MED ORDER — ONDANSETRON HCL 4 MG PO TABS: 4.0000 mg | ORAL_TABLET | Freq: Three times a day (TID) | ORAL | 0 refills | Status: AC | PRN

## 2022-01-17 MED ORDER — ONDANSETRON 4 MG PO TBDP
ORAL_TABLET | ORAL | Status: AC
  Filled 2022-01-17: qty 1

## 2022-01-17 MED ORDER — ONDANSETRON 4 MG PO TBDP
4.0000 mg | ORAL_TABLET | Freq: Once | ORAL | Status: AC
  Administered 2022-01-17: 4 mg via ORAL

## 2022-01-17 MED ORDER — PSEUDOEPH-BROMPHEN-DM 30-2-10 MG/5ML PO SYRP: 10.0000 mL | ORAL_SOLUTION | Freq: Four times a day (QID) | ORAL | 0 refills | Status: DC | PRN

## 2022-01-17 NOTE — ED Triage Notes (Signed)
Onset last night. Pain in the chest, SOB, chills, weakness, cough, headache, and emesis.   No known sick exposure. Patient does work at a nursing home.

## 2022-01-17 NOTE — ED Provider Notes (Signed)
[facility]    CSN: [id number] Arrival date & time: 01/17/22  1528      History   Chief Complaint Chief Complaint  Patient presents with   URI    HPI Tracy [name] is a 26 y.o. female.   Pt complains of cough, headache, n/v, body aches, and subjective fever that started last night.  No known sick exposures, but she does work in a nursing facility.  She has taken nothing for the sx.  She denies wheezing, shortness of breath.  Reports one episode of vomiting last night.     Past Medical History:  Diagnosis Date   ASCUS with positive high risk HPV cervical 05/25/2020   Trichomonal vaginitis during pregnancy in second trimester 08/13/2020    Patient Active Problem List   Diagnosis Date Noted   Acute blood loss anemia 12/10/2020   Labor and delivery indication for care or intervention 12/10/2020   Encounter for initial prescription of Nexplanon    Encounter for elective induction of labor 12/08/2020   S/P cesarean section 12/08/2020   Trichomonal vaginitis during pregnancy, antepartum, second trimester 08/14/2020   ASCUS with positive high risk HPV cervical 05/25/2020   Gonorrhea affecting pregnancy 05/21/2020   Chlamydia infection affecting pregnancy 05/21/2020   Supervision of other normal pregnancy, antepartum 04/06/2020   Maternal varicella, non-immune 12/20/2016   Heart murmur previously undiagnosed 10/07/2014    Past Surgical History:  Procedure Laterality Date   CESAREAN SECTION N/A 12/08/2020   Procedure: CESAREAN SECTION;  Surgeon: Tereso Newcomer, MD;  Location: [facility] LD [facility]S;  Service: Obstetrics;  Laterality: N/A;   WISDOM TOOTH EXTRACTION      OB History     Gravida  3   Para  2   Term  2   Preterm      AB  1   Living  2      SAB      IAB  1   Ectopic      Multiple  0   Live Births  2            Home Medications    Prior to Admission medications   Medication Sig Start Date End Date Taking? Authorizing  Provider  brompheniramine-pseudoephedrine-DM 30-2-10 MG/5ML syrup Take 10 mLs by mouth 4 (four) times daily as needed. 01/17/22  Yes Ward, Tylene Fantasia, PA-C  ondansetron (ZOFRAN) 4 MG tablet Take 1 tablet (4 mg total) by mouth every 8 (eight) hours as needed for nausea or vomiting. 01/17/22  Yes Ward, Tylene Fantasia, PA-C  ferrous sulfate 325 (65 FE) MG tablet Take 1 tablet (325 mg total) by mouth every other day. 12/11/20   Arabella Merles, CNM  ibuprofen (ADVIL) 600 MG tablet Take 1 tablet (600 mg total) by mouth every 6 (six) hours as needed. Patient not taking: Reported on 01/13/2021 12/10/20   Cam Hai D, CNM  oxyCODONE (OXY IR/ROXICODONE) 5 MG immediate release tablet Take 1-2 tablets (5-10 mg total) by mouth every 4 (four) hours as needed for moderate pain. Patient not taking: No sig reported 12/10/20   Arabella Merles, CNM  Prenatal Vit-Fe Fumarate-FA (PREPLUS) 27-1 MG TABS Take 1 tablet by mouth daily. Patient not taking: No sig reported 04/06/20   Constant, Peggy, MD    Family History Family History  Problem Relation Age of Onset   Thyroid disease Maternal Aunt    Hypertension Maternal Aunt    Thyroid disease Paternal Uncle    Thyroid disease Paternal  Grandmother    Healthy Mother    Healthy Father     Social History Social History   Tobacco Use   Smoking status: Former   Smokeless tobacco: Never   Tobacco comments:    last used when confirmed pregnancy  Vaping Use   Vaping Use: Never used  Substance Use Topics   Alcohol use: No    Alcohol/week: 0.0 standard drinks of alcohol   Drug use: No    Types: Marijuana    Comment: last used a year ago     Allergies   Patient has no known allergies.   Review of Systems Review of Systems  Constitutional:  Positive for chills and fever.  HENT:  Positive for congestion. Negative for ear pain and sore throat.   Eyes:  Negative for pain and visual disturbance.  Respiratory:  Positive for cough. Negative for shortness of  breath.   Cardiovascular:  Negative for chest pain and palpitations.  Gastrointestinal:  Negative for abdominal pain and vomiting.  Genitourinary:  Negative for dysuria and hematuria.  Musculoskeletal:  Positive for myalgias. Negative for arthralgias and back pain.  Skin:  Negative for color change and rash.  Neurological:  Negative for seizures and syncope.  All other systems reviewed and are negative.    Physical Exam Triage Vital Signs ED Triage Vitals  Enc Vitals Group     BP 01/17/22 1726 97/66     Pulse Rate 01/17/22 1726 88     Resp 01/17/22 1726 16     Temp 01/17/22 1726 98.4 F (36.9 C)     Temp Source 01/17/22 1726 Oral     SpO2 01/17/22 1726 97 %     Weight --      Height --      Head Circumference --      Peak Flow --      Pain Score 01/17/22 1725 9     Pain Loc --      Pain Edu? --      Excl. in GC? --    No data found.  Updated Vital Signs BP 97/66 (BP Location: Left Arm)   Pulse 88   Temp 98.4 F (36.9 C) (Oral)   Resp 16   LMP 01/01/2022 (Exact Date)   SpO2 97%   Breastfeeding No   Visual Acuity Right Eye Distance:   Left Eye Distance:   Bilateral Distance:    Right Eye Near:   Left Eye Near:    Bilateral Near:     Physical Exam Vitals and nursing note reviewed.  Constitutional:      General: She is not in acute distress.    Appearance: She is well-developed.  HENT:     Head: Normocephalic and atraumatic.  Eyes:     Conjunctiva/sclera: Conjunctivae normal.  Cardiovascular:     Rate and Rhythm: Normal rate and regular rhythm.     Heart sounds: No murmur heard. Pulmonary:     Effort: Pulmonary effort is normal. No respiratory distress.     Breath sounds: Normal breath sounds.  Abdominal:     Palpations: Abdomen is soft.     Tenderness: There is no abdominal tenderness.  Musculoskeletal:        General: No swelling.     Cervical back: Neck supple.  Skin:    General: Skin is warm and dry.     Capillary Refill: Capillary refill  takes less than 2 seconds.  Neurological:     Mental Status: She is alert.  Psychiatric:        Mood and Affect: Mood normal.      UC Treatments / Results  Labs (all labs ordered are listed, but only abnormal results are displayed) Labs Reviewed  RESP PANEL BY RT-PCR (FLU A&B, COVID) [id number]    EKG   Radiology No results found.  Procedures Procedures (including critical care time)  Medications Ordered in UC Medications  ondansetron (ZOFRAN-ODT) disintegrating tablet 4 mg (4 mg Oral Given 01/17/22 1750)    Initial Impression / Assessment and Plan / UC Course  I have reviewed the triage vital signs and the nursing notes.  Pertinent labs & imaging results that were available during my care of the patient were reviewed by me and considered in my medical decision making (see chart for details).     URI, COVID/Flu test pending.  Consider tamiflu if flu positive.  Supportive care discussed.  Prescribed zofran for nausea, zofran given in clinic today with some relief.  Cough syrup prescribed.  Lungs clear, pt overall well appearing, non toxic.  Stable for discharge.  Return precautions dicussed.  Final Clinical Impressions(s) / UC Diagnoses   Final diagnoses:  Acute upper respiratory infection  Nausea without vomiting     Discharge Instructions      Take zofran as needed for nausea Drink plenty of fluids, rest Recommend mucinex and flonase Can take cough syrup as needed for cough.  Recommend Tylenol or Ibuprofen as needed for body aches and headache.  COVID and Flu test pending, will call if positive results.  If COVID positive recommend staying home away from others for 5 days then wear a mask around others for another 5 days.    ED Prescriptions     Medication Sig Dispense Auth. Provider   ondansetron (ZOFRAN) 4 MG tablet Take 1 tablet (4 mg total) by mouth every 8 (eight) hours as needed for nausea or vomiting. 20 tablet Ward, Lenise Arena, PA-C    brompheniramine-pseudoephedrine-DM 30-2-10 MG/5ML syrup Take 10 mLs by mouth 4 (four) times daily as needed. 120 mL Ward, Lenise Arena, PA-C      PDMP not reviewed this encounter.   Ward, Lenise Arena, PA-C 01/17/22 1805

## 2022-01-17 NOTE — Discharge Instructions (Addendum)
Take zofran as needed for nausea Drink plenty of fluids, rest Recommend mucinex and flonase Can take cough syrup as needed for cough.  Recommend Tylenol or Ibuprofen as needed for body aches and headache.  COVID and Flu test pending, will call if positive results.  If COVID positive recommend staying home away from others for 5 days then wear a mask around others for another 5 days.

## 2022-01-18 ENCOUNTER — Telehealth (HOSPITAL_COMMUNITY): Payer: Self-pay | Admitting: Emergency Medicine

## 2022-01-18 MED ORDER — OSELTAMIVIR PHOSPHATE 75 MG PO CAPS: 75.0000 mg | ORAL_CAPSULE | Freq: Two times a day (BID) | ORAL | 0 refills | Status: DC

## 2022-01-21 ENCOUNTER — Encounter (HOSPITAL_COMMUNITY): Payer: Self-pay

## 2022-01-21 ENCOUNTER — Ambulatory Visit (HOSPITAL_COMMUNITY)
Admission: RE | Admit: 2022-01-21 | Discharge: 2022-01-21 | Disposition: A | Discharge status: Home / Self Care | Payer: Medicaid Other | Source: Ambulatory Visit | Attending: Family Medicine | Admitting: Family Medicine

## 2022-01-21 VITALS — BP 107/62 | HR 75 | Temp 99.2°F | Resp 18

## 2022-01-21 DIAGNOSIS — R490 Dysphonia: Secondary | ICD-10-CM | POA: Diagnosis not present

## 2022-01-21 DIAGNOSIS — Z8709 Personal history of other diseases of the respiratory system: Secondary | ICD-10-CM

## 2022-01-21 NOTE — ED Provider Notes (Signed)
[facility]    CSN: [id number] Arrival date & time: 01/21/22  1350      History   Chief Complaint Chief Complaint  Patient presents with   Letter for School/Work    HPI Tracy [name] is a 26 y.o. female.   Patient is here for note for work.  She was dx with flu early this week, and ready to go back tomorrow.  Her cough is better, but still with hoarse voice.  Wonders what she can do about that.  No fevers.   Past Medical History:  Diagnosis Date   ASCUS with positive high risk HPV cervical 05/25/2020   Trichomonal vaginitis during pregnancy in second trimester 08/13/2020    Patient Active Problem List   Diagnosis Date Noted   Acute blood loss anemia 12/10/2020   Labor and delivery indication for care or intervention 12/10/2020   Encounter for initial prescription of Nexplanon    Encounter for elective induction of labor 12/08/2020   S/P cesarean section 12/08/2020   Trichomonal vaginitis during pregnancy, antepartum, second trimester 08/14/2020   ASCUS with positive high risk HPV cervical 05/25/2020   Gonorrhea affecting pregnancy 05/21/2020   Chlamydia infection affecting pregnancy 05/21/2020   Supervision of other normal pregnancy, antepartum 04/06/2020   Maternal varicella, non-immune 12/20/2016   Heart murmur previously undiagnosed 10/07/2014    Past Surgical History:  Procedure Laterality Date   CESAREAN SECTION N/A 12/08/2020   Procedure: CESAREAN SECTION;  Surgeon: Tereso Newcomer, MD;  Location: [facility];  Service: Obstetrics;  Laterality: N/A;   WISDOM TOOTH EXTRACTION      OB History     Gravida  3   Para  2   Term  2   Preterm      AB  1   Living  2      SAB      IAB  1   Ectopic      Multiple  0   Live Births  2            Home Medications    Prior to Admission medications   Medication Sig Start Date End Date Taking? Authorizing Provider  oseltamivir (TAMIFLU) 75 MG capsule Take 1 capsule (75 mg  total) by mouth every 12 (twelve) hours. 01/18/22  Yes Lamptey, Britta Mccreedy, MD  brompheniramine-pseudoephedrine-DM 30-2-10 MG/5ML syrup Take 10 mLs by mouth 4 (four) times daily as needed. 01/17/22   Ward, Tylene Fantasia, PA-C  ferrous sulfate 325 (65 FE) MG tablet Take 1 tablet (325 mg total) by mouth every other day. 12/11/20   Arabella Merles, CNM  ibuprofen (ADVIL) 600 MG tablet Take 1 tablet (600 mg total) by mouth every 6 (six) hours as needed. Patient not taking: Reported on 01/13/2021 12/10/20   Arabella Merles, CNM  ondansetron (ZOFRAN) 4 MG tablet Take 1 tablet (4 mg total) by mouth every 8 (eight) hours as needed for nausea or vomiting. 01/17/22   Ward, Tylene Fantasia, PA-C  oxyCODONE (OXY IR/ROXICODONE) 5 MG immediate release tablet Take 1-2 tablets (5-10 mg total) by mouth every 4 (four) hours as needed for moderate pain. Patient not taking: Reported on 12/15/2020 12/10/20   Arabella Merles, CNM  Prenatal Vit-Fe Fumarate-FA (PREPLUS) 27-1 MG TABS Take 1 tablet by mouth daily. Patient not taking: Reported on 12/15/2020 04/06/20   Constant, Peggy, MD    Family History Family History  Problem Relation Age of Onset   Thyroid disease Maternal Aunt  Hypertension Maternal Aunt    Thyroid disease Paternal Uncle    Thyroid disease Paternal Grandmother    Healthy Mother    Healthy Father     Social History Social History   Tobacco Use   Smoking status: Former   Smokeless tobacco: Never   Tobacco comments:    last used when confirmed pregnancy  Vaping Use   Vaping Use: Never used  Substance Use Topics   Alcohol use: No    Alcohol/week: 0.0 standard drinks of alcohol   Drug use: No    Types: Marijuana    Comment: last used a year ago     Allergies   Patient has no known allergies.   Review of Systems Review of Systems  Constitutional: Negative.   HENT:  Positive for voice change.   Respiratory:  Positive for cough.   Cardiovascular: Negative.   Gastrointestinal: Negative.    Genitourinary: Negative.      Physical Exam Triage Vital Signs ED Triage Vitals  Enc Vitals Group     BP 01/21/22 1410 107/62     Pulse Rate 01/21/22 1410 75     Resp 01/21/22 1410 18     Temp 01/21/22 1410 99.2 F (37.3 C)     Temp Source 01/21/22 1410 Oral     SpO2 01/21/22 1410 98 %     Weight --      Height --      Head Circumference --      Peak Flow --      Pain Score 01/21/22 1408 0     Pain Loc --      Pain Edu? --      Excl. in [place]? --    No data found.  Updated Vital Signs BP 107/62 (BP Location: Right Arm)   Pulse 75   Temp 99.2 F (37.3 C) (Oral)   Resp 18   LMP 01/01/2022 (Exact Date)   SpO2 98%   Visual Acuity Right Eye Distance:   Left Eye Distance:   Bilateral Distance:    Right Eye Near:   Left Eye Near:    Bilateral Near:     Physical Exam Constitutional:      Appearance: Normal appearance.  Cardiovascular:     Rate and Rhythm: Normal rate.  Pulmonary:     Effort: Pulmonary effort is normal.  Neurological:     Mental Status: She is alert.  Psychiatric:        Mood and Affect: Mood normal.      UC Treatments / Results  Labs (all labs ordered are listed, but only abnormal results are displayed) Labs Reviewed - No data to display  EKG   Radiology No results found.  Procedures Procedures (including critical care time)  Medications Ordered in UC Medications - No data to display  Initial Impression / Assessment and Plan / UC Course  I have reviewed the triage vital signs and the nursing notes.  Pertinent labs & imaging results that were available during my care of the patient were reviewed by me and considered in my medical decision making (see chart for details).   Final Clinical Impressions(s) / UC Diagnoses   Final diagnoses:  History of influenza  Hoarseness of voice     Discharge Instructions      You were seen today for follow up of flu.  You may return to work tomorrow.  I recommend over the counter  medications to help with your cough, and your voice should improve  as well.     ED Prescriptions   None    PDMP not reviewed this encounter.   Jannifer Franklin, MD 01/21/22 1426

## 2022-01-21 NOTE — Discharge Instructions (Signed)
You were seen today for follow up of flu.  You may return to work tomorrow.  I recommend over the counter medications to help with your cough, and your voice should improve as well.

## 2022-01-21 NOTE — ED Triage Notes (Signed)
Pt came in for flu on Monday and she started tamiflu on Wednesday she is feeling better now but needs a work note saying she can go back on Friday. She states she is fever free and fine now but can't talk much.

## 2022-05-23 ENCOUNTER — Encounter (HOSPITAL_COMMUNITY): Payer: Self-pay

## 2022-05-23 ENCOUNTER — Ambulatory Visit (HOSPITAL_COMMUNITY)
Admission: EM | Admit: 2022-05-23 | Discharge: 2022-05-23 | Disposition: A | Discharge status: Home / Self Care | Payer: Medicaid Other | Attending: Internal Medicine | Admitting: Internal Medicine

## 2022-05-23 DIAGNOSIS — H1011 Acute atopic conjunctivitis, right eye: Secondary | ICD-10-CM

## 2022-05-23 MED ORDER — OLOPATADINE HCL 0.1 % OP SOLN: 1.0000 [drp] | Freq: Two times a day (BID) | OPHTHALMIC | 12 refills | Status: AC

## 2022-05-23 NOTE — ED Provider Notes (Signed)
[facility]    CSN: [id number] Arrival date & time: 05/23/22  1410      History   Chief Complaint Chief Complaint  Patient presents with   Eye Pain    HPI Tracy [name] is a 27 y.o. female with a history of seasonal allergies comes to the urgent care with 1 day history of itching of the right eyelid.  Patient's symptoms started last night and has been persistent.  She denies any nausea or vomiting.  She also complains of generalized bodyaches.  No fever or chills.  No sick contacts.  No itchy ears, no sore throat.  Patient denies any double vision or blurry vision.  No tearing of the eye.  No sensation of foreign body in the eye. HPI  Past Medical History:  Diagnosis Date   ASCUS with positive high risk HPV cervical 05/25/2020   Trichomonal vaginitis during pregnancy in second trimester 08/13/2020    Patient Active Problem List   Diagnosis Date Noted   Acute blood loss anemia 12/10/2020   Labor and delivery indication for care or intervention 12/10/2020   Encounter for initial prescription of Nexplanon    Encounter for elective induction of labor 12/08/2020   S/P cesarean section 12/08/2020   Trichomonal vaginitis during pregnancy, antepartum, second trimester 08/14/2020   ASCUS with positive high risk HPV cervical 05/25/2020   Gonorrhea affecting pregnancy 05/21/2020   Chlamydia infection affecting pregnancy 05/21/2020   Supervision of other normal pregnancy, antepartum 04/06/2020   Maternal varicella, non-immune 12/20/2016   Heart murmur previously undiagnosed 10/07/2014    Past Surgical History:  Procedure Laterality Date   CESAREAN SECTION N/A 12/08/2020   Procedure: CESAREAN SECTION;  Surgeon: Osborne Oman, MD;  Location: [facility];  Service: Obstetrics;  Laterality: N/A;   WISDOM TOOTH EXTRACTION      OB History     Gravida  3   Para  2   Term  2   Preterm      AB  1   Living  2      SAB      IAB  1   Ectopic       Multiple  0   Live Births  2            Home Medications    Prior to Admission medications   Medication Sig Start Date End Date Taking? Authorizing Provider  ferrous sulfate 325 (65 FE) MG tablet Take 1 tablet (325 mg total) by mouth every other day. 12/11/20  Yes Serita Grammes D, CNM  olopatadine (PATANOL) 0.1 % ophthalmic solution Place 1 drop into both eyes 2 (two) times daily. 05/23/22  Yes Markise Haymer, Myrene Galas, MD  ondansetron (ZOFRAN) 4 MG tablet Take 1 tablet (4 mg total) by mouth every 8 (eight) hours as needed for nausea or vomiting. 01/17/22  Yes Ward, Lenise Arena, PA-C    Family History Family History  Problem Relation Age of Onset   Thyroid disease Maternal Aunt    Hypertension Maternal Aunt    Thyroid disease Paternal Uncle    Thyroid disease Paternal Grandmother    Healthy Mother    Healthy Father     Social History Social History   Tobacco Use   Smoking status: Former   Smokeless tobacco: Never   Tobacco comments:    last used when confirmed pregnancy  Vaping Use   Vaping Use: Never used  Substance Use Topics   Alcohol use: No  Alcohol/week: 0.0 standard drinks of alcohol   Drug use: No    Types: Marijuana    Comment: last used a year ago     Allergies   Patient has no known allergies.   Review of Systems Review of Systems As per HPI  Physical Exam Triage Vital Signs ED Triage Vitals  Enc Vitals Group     BP 05/23/22 1539 117/73     Pulse Rate 05/23/22 1539 76     Resp 05/23/22 1539 12     Temp 05/23/22 1539 98.2 F (36.8 C)     Temp Source 05/23/22 1539 Oral     SpO2 05/23/22 1539 98 %     Weight --      Height --      Head Circumference --      Peak Flow --      Pain Score 05/23/22 1536 7     Pain Loc --      Pain Edu? --      Excl. in [place]? --    No data found.  Updated Vital Signs BP 117/73 (BP Location: Right Arm)   Pulse 76   Temp 98.2 F (36.8 C) (Oral)   Resp 12   SpO2 98%   Visual Acuity Right Eye Distance:    Left Eye Distance:   Bilateral Distance:    Right Eye Near:   Left Eye Near:    Bilateral Near:     Physical Exam Vitals and nursing note reviewed.  Constitutional:      General: She is not in acute distress.    Appearance: Normal appearance. She is not ill-appearing or diaphoretic.  HENT:     Right Ear: Tympanic membrane normal.     Left Ear: Tympanic membrane normal.     Mouth/Throat:     Mouth: Mucous membranes are moist.  Eyes:     General: No scleral icterus.       Right eye: No discharge.        Left eye: No discharge.     Extraocular Movements: Extraocular movements intact.     Pupils: Pupils are equal, round, and reactive to light.  Cardiovascular:     Rate and Rhythm: Normal rate and regular rhythm.     Pulses: Normal pulses.     Heart sounds: Normal heart sounds.  Pulmonary:     Effort: Pulmonary effort is normal.     Breath sounds: Normal breath sounds.  Abdominal:     General: Bowel sounds are normal.     Palpations: Abdomen is soft.  Musculoskeletal:        General: Normal range of motion.  Neurological:     Mental Status: She is alert.      UC Treatments / Results  Labs (all labs ordered are listed, but only abnormal results are displayed) Labs Reviewed - No data to display  EKG   Radiology No results found.  Procedures Procedures (including critical care time)  Medications Ordered in UC Medications - No data to display  Initial Impression / Assessment and Plan / UC Course  I have reviewed the triage vital signs and the nursing notes.  Pertinent labs & imaging results that were available during my care of the patient were reviewed by me and considered in my medical decision making (see chart for details).     1.  Allergic conjunctivitis: Patanol eyedrops twice daily as needed for itching Patient is advised to start taking over-the-counter allergy medications Return to urgent  care if symptoms worsen  Final Clinical Impressions(s) /  UC Diagnoses   Final diagnoses:  Allergic conjunctivitis of right eye     Discharge Instructions      Please use eyedrops as recommended Please start taking over-the-counter allergy medication to help with other allergy related symptoms If you have worsening symptoms please return to urgent care to be reevaluated   ED Prescriptions     Medication Sig Dispense Auth. Provider   olopatadine (PATANOL) 0.1 % ophthalmic solution Place 1 drop into both eyes 2 (two) times daily. 5 mL Ifeoluwa Beller, Myrene Galas, MD      PDMP not reviewed this encounter.   Chase Picket, MD 05/23/22 [id number]

## 2022-05-23 NOTE — Discharge Instructions (Addendum)
Please use eyedrops as recommended Please start taking over-the-counter allergy medication to help with other allergy related symptoms If you have worsening symptoms please return to urgent care to be reevaluated

## 2022-05-23 NOTE — ED Triage Notes (Signed)
Pt is here for bodyache x 3 days and right eye pain since lat night

## 2022-07-10 IMAGING — US US MFM OB FOLLOW-UP
1 series · 14 of 28 positions shown · non-contrast
Comparison: none

[Series 1: us mfm ob follow-up · 92 acquisitions, 14 frames shown]
[im 4/92]
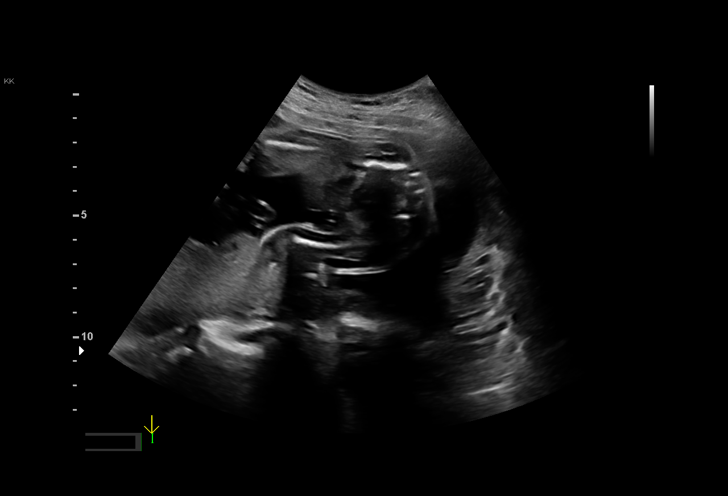
[im 11/92]
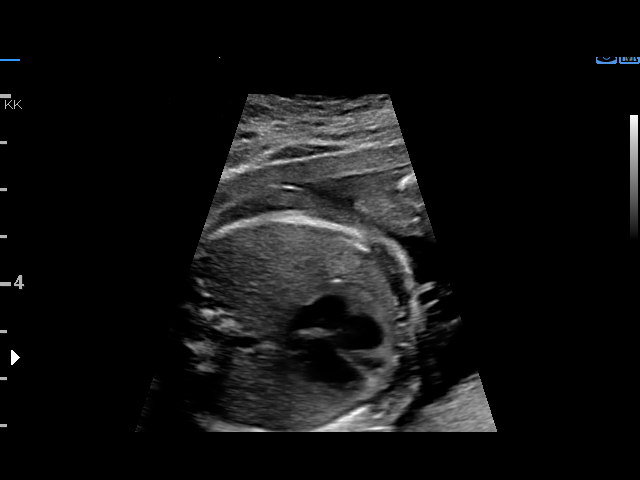
[im 17/92]
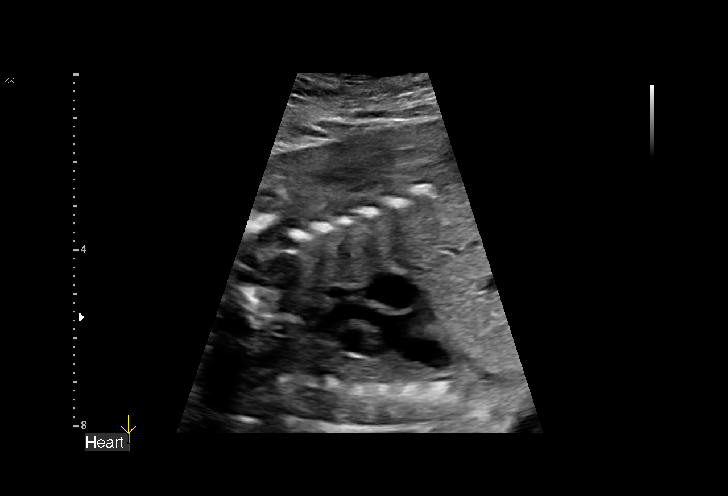
[im 24/92]
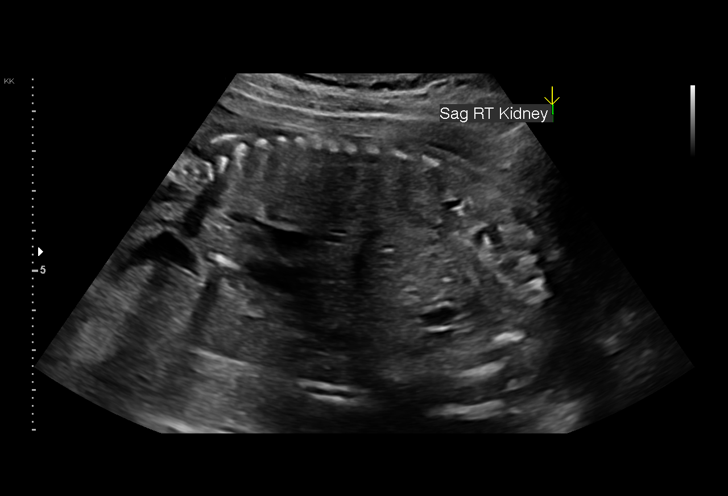
[im 31/92]
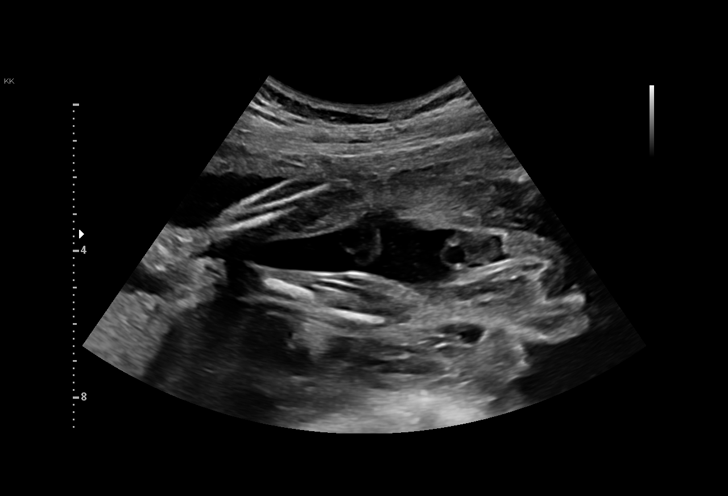
[im 38/92]
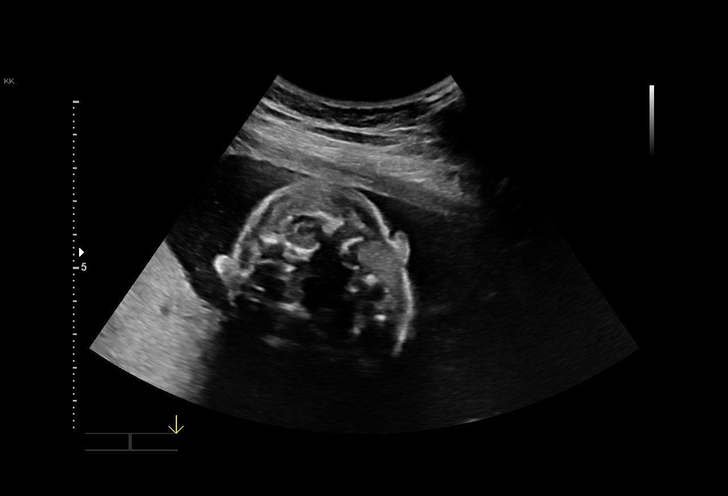
[im 44/92]
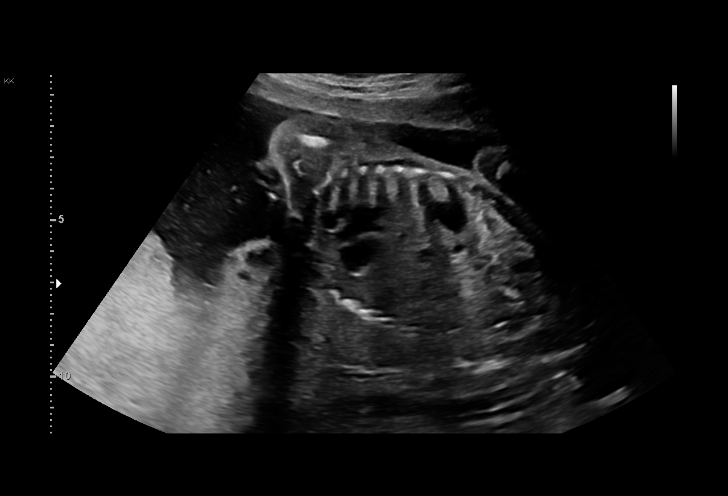
[im 51/92]
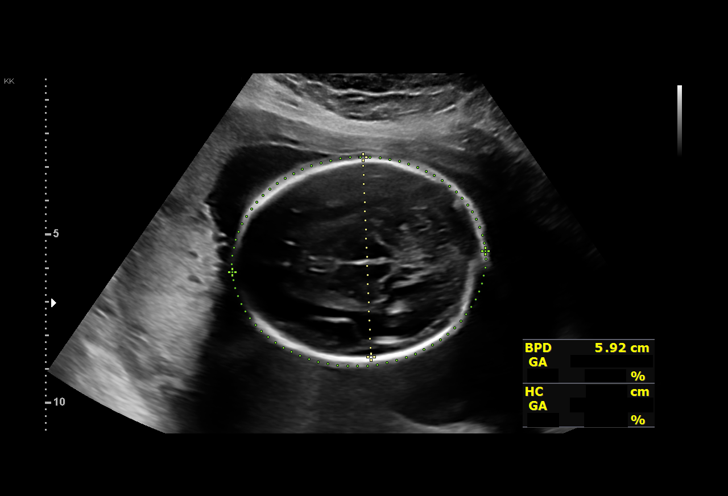
[im 58/92]
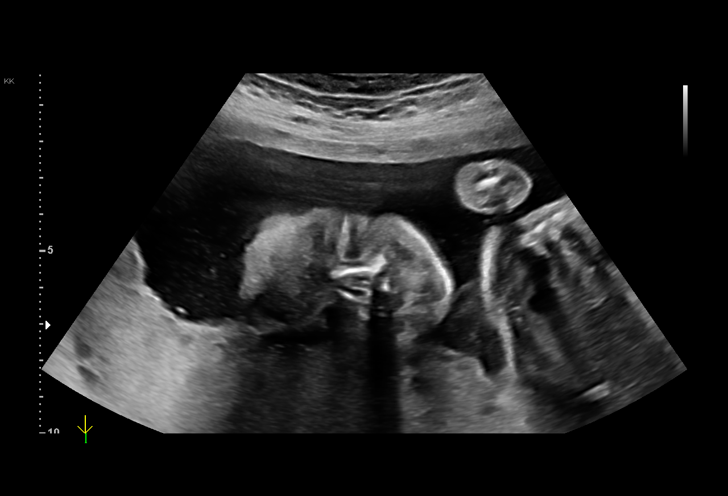
[im 65/92]
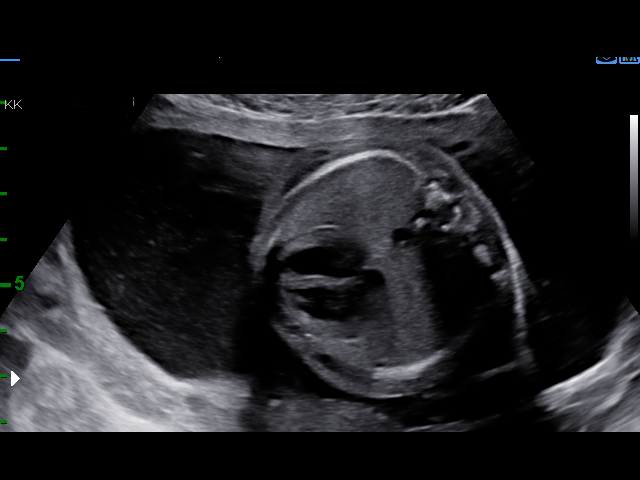
[im 71/92]
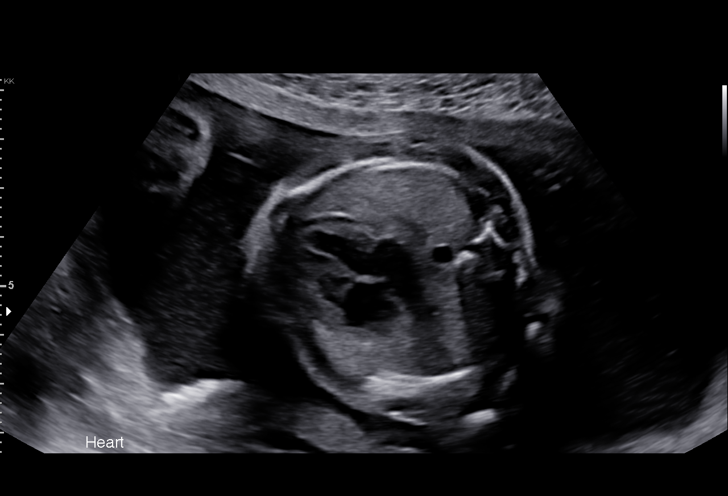
[im 78/92]
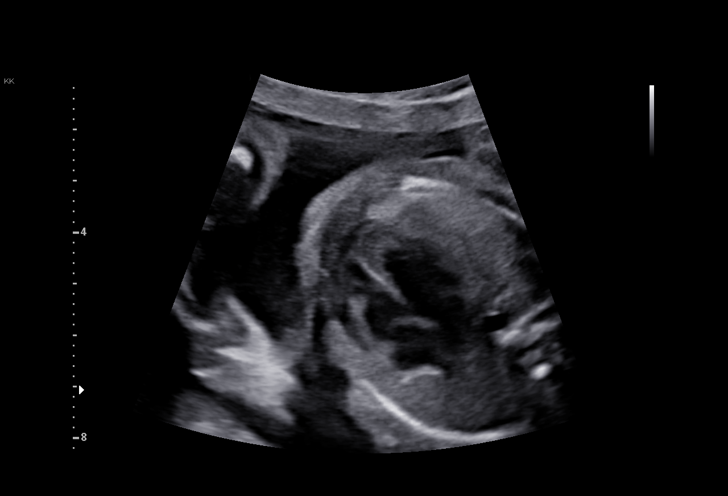
[im 85/92]
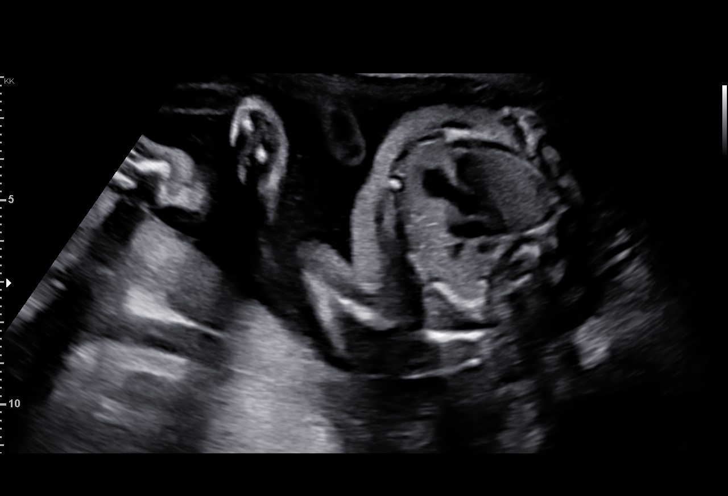
[im 92/92]
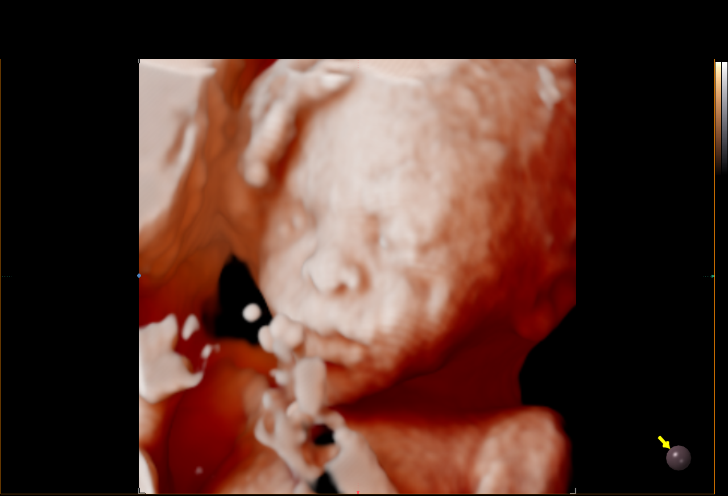

[14 of 28 positions shown; findings below may reference images not displayed]

Indications

 Antenatal follow-up for nonvisualized fetal
 anatomy
 23 weeks gestation of pregnancy
 Medical complication of pregnancy (Trich,
 HPV)
 Low Risk NIPS(Negative Horizon)
Fetal Evaluation

 Num Of Fetuses:          1
 Fetal Heart              160
 Rate(bpm):
 Cardiac Activity:        Observed
 Presentation:            Breech
 Placenta:                Posterior Fundal
 P. Cord Insertion:       Previously Visualized

 Amniotic Fluid
 AFI FV:      Within normal limits

                             Largest Pocket(cm)

Biometry
 BPD:      59.1  mm     G. Age:  24w 1d         80  %    CI:        75.86   %    70 - 86
                                                         FL/HC:      19.3   %    19.2 -
 HC:     215.1   mm     G. Age:  23w 4d         52  %    HC/AC:      1.12        1.05 -
 AC:     192.1   mm     G. Age:  24w 0d         67  %    FL/BPD:     70.2   %    71 - 87
 FL:       41.5  mm     G. Age:  23w 3d         50  %    FL/AC:      21.6   %    20 - 24

 Est. FW:     624   g      1 lb 6 oz     72  %
                    m
OB History

 Gravidity:    3
 Living:       1
Gestational Age

 LMP:           27w 2d        Date:  02/02/20                 EDD:   11/08/20
 U/S Today:     23w 6d                                        EDD:   12/02/20
 Best:          23w 1d     Det. By:  U/S C R L  (04/16/20)     EDD:  12/07/20
Anatomy

 Cranium:               Appears normal         Aortic Arch:            Appears normal
 Cavum:                 Appears normal         Ductal Arch:            Previously seen
 Ventricles:            Appears normal         Diaphragm:              Appears normal
 Choroid Plexus:        Previously seen        Stomach:                Appears normal,
                                                                       left sided
 Cerebellum:            Appears normal         Abdomen:                Appears normal
 Posterior Fossa:       Appears normal         Abdominal Wall:         Previously seen
 Nuchal Fold:           Previously seen        Cord Vessels:           Previously seen
 Face:                  Orbits and profile     Kidneys:                Appear normal
                        previously seen
 Lips:                  Appears normal         Bladder:                Appears normal
 Thoracic:              Appears normal         Spine:                  Previously seen
 Heart:                 Appears normal         Upper Extremities:      Previously seen
                        (4CH, axis, and
                        situs)
 RVOT:                  Previously seen        Lower Extremities:      Previously seen
 LVOT:                  Previously seen

 Other:  Heels/feet and open hands/5th digits previously visualized. Fetus
         appears to be a male.
Cervix Uterus Adnexa

 Cervix
 Length:           3.06  cm.
 Normal appearance by transabdominal scan.
Comments

 This patient was seen for a follow up exam to complete the
 views of the fetal anatomy.  She denies any problems since
 her last exam.
 She was informed that the fetal growth and amniotic fluid
 level appears appropriate for her gestational age.
 The views of the fetal anatomy were visualized today.  There
 were no obvious anomalies noted.  The limitations of
 ultrasound in the detection of all anomalies was discussed.
 Follow-up as indicated.

## 2023-03-13 ENCOUNTER — Ambulatory Visit: Payer: 59 | Admitting: Obstetrics and Gynecology

## 2023-10-02 ENCOUNTER — Emergency Department (HOSPITAL_COMMUNITY)
Admission: EM | Admit: 2023-10-02 | Discharge: 2023-10-02 | Disposition: A | Discharge status: Home / Self Care | Attending: Emergency Medicine | Admitting: Emergency Medicine

## 2023-10-02 ENCOUNTER — Other Ambulatory Visit: Payer: Self-pay

## 2023-10-02 ENCOUNTER — Encounter (HOSPITAL_COMMUNITY): Payer: Self-pay

## 2023-10-02 DIAGNOSIS — J029 Acute pharyngitis, unspecified: Secondary | ICD-10-CM | POA: Diagnosis present

## 2023-10-02 LAB — RESP PANEL BY RT-PCR (RSV, FLU A&B, COVID)  RVPGX2
Influenza A by PCR: NEGATIVE
Influenza B by PCR: NEGATIVE
Resp Syncytial Virus by PCR: NEGATIVE
SARS Coronavirus 2 by RT PCR: NEGATIVE

## 2023-10-02 LAB — GROUP A STREP BY PCR: Group A Strep by PCR: NOT DETECTED

## 2023-10-02 NOTE — Discharge Instructions (Signed)
 You can continue to use over-the-counter medications for cough and cold, encourage you to increase your fluid intake and you can use cough lozenges as needed to help with your symptoms.  Typically these resolve in about 7 to 10 days, please follow-up with the primary care provider should you have any further concerns.

## 2023-10-02 NOTE — ED Provider Notes (Signed)
 [facility] Provider Note   CSN: [id number] Arrival date & time: 10/02/23  9279     Patient presents with: Sore Throat   Tracy [name] is a 28 y.o. female presents with a 1 day history of sore throat.  Does not have any associated cough, denies any fever, chills, body aches.  Does not have any difficulty swallowing though states that is occasionally painful to swallow.  Has not taken any over-the-counter medications, does not have any headaches, ear pain, no dizziness or vertigo.    Sore Throat       Prior to Admission medications   Medication Sig Start Date End Date Taking? Authorizing Provider  ferrous sulfate  325 (65 FE) MG tablet Take 1 tablet (325 mg total) by mouth every other day. 12/11/20   Loreli Suzen BIRCH, CNM  olopatadine  (PATANOL) 0.1 % ophthalmic solution Place 1 drop into both eyes 2 (two) times daily. 05/23/22   Lamptey, Aleene KIDD, MD  ondansetron  (ZOFRAN ) 4 MG tablet Take 1 tablet (4 mg total) by mouth every 8 (eight) hours as needed for nausea or vomiting. 01/17/22   Ward, Jessica Z, PA-C    Allergies: Patient has no known allergies.    Review of Systems  HENT:  Positive for sore throat.   All other systems reviewed and are negative.   Updated Vital Signs BP 126/67 (BP Location: Right Arm)   Pulse 87   Temp 98.3 F (36.8 C)   Resp 15   Ht 5' 4 (1.626 m)   Wt 76.2 kg   LMP 09/05/2023 (Approximate)   SpO2 98%   BMI 28.84 kg/m   Physical Exam Vitals and nursing note reviewed.  Constitutional:      General: She is not in acute distress.    Appearance: She is well-developed.  HENT:     Head: Normocephalic and atraumatic.     Mouth/Throat:     Mouth: Mucous membranes are moist.     Pharynx: Oropharynx is clear. Uvula midline. Posterior oropharyngeal erythema present.     Tonsils: No tonsillar exudate or tonsillar abscesses.  Eyes:     Conjunctiva/sclera: Conjunctivae normal.  Cardiovascular:      Rate and Rhythm: Normal rate and regular rhythm.     Heart sounds: No murmur heard. Pulmonary:     Effort: Pulmonary effort is normal. No respiratory distress.     Breath sounds: Normal breath sounds.  Abdominal:     Palpations: Abdomen is soft.     Tenderness: There is no abdominal tenderness.  Musculoskeletal:        General: No swelling.     Cervical back: Neck supple.  Skin:    General: Skin is warm and dry.     Capillary Refill: Capillary refill takes less than 2 seconds.  Neurological:     Mental Status: She is alert.  Psychiatric:        Mood and Affect: Mood normal.     (all labs ordered are listed, but only abnormal results are displayed) Labs Reviewed  GROUP A STREP BY PCR  RESP PANEL BY RT-PCR (RSV, FLU A&B, COVID)  [id number]    EKG: None  Radiology: No results found.   Procedures   Medications Ordered in the ED - No data to display  Medical Decision Making  Medical Decision Making:   Tracy [name] is a 28 y.o. female who presented to the ED today with 1 day history of sore throat detailed above.     Complete initial physical exam performed, notably the patient  was alert and oriented no apparent distress.  Only notable exam findings of erythema in the posterior oropharynx, no tonsillar exudate is appreciated..    Reviewed and confirmed nursing documentation for past medical history, family history, social history.    Initial Assessment:   With the patient's presentation of sore throat, most likely diagnosis is viral pharyngitis/URI. Other diagnoses were considered including (but not limited to) streptococcal pharyngitis. These are considered less likely due to history of present illness and physical exam findings.     Initial Plan:  Obtain respiratory panel swab along with strep swab Objective evaluation as below reviewed   Initial Study Results:   Laboratory  All laboratory results reviewed without evidence of  clinically relevant pathology.   Exceptions include: None   Reassessment and Plan:   Physical exam findings do not suggest acute streptococcal pharyngitis and nor does the rapid strep swab that was tested.  Nasopharyngeal swab for respiratory panel did not demonstrate any acute abnormalities.  As such, plan is to discharge patient with symptomatic care, follow-up with outpatient for any concerns further.  Thoroughly discussed with patient, discussed return precautions, she will continue to use over-the-counter medications for symptomatic treatment going forward.       Final diagnoses:  None    ED Discharge Orders     None          Tracy [name], [place] 10/02/23 1402    Tracy Lamar BROCKS, MD 10/07/23 [phone]

## 2023-10-02 NOTE — ED Notes (Signed)
 Pt verbalized understanding of discharge instructions. Discussed increasing fluid intake with pt. Pt ambulated from Ed with steady gait.

## 2023-10-02 NOTE — ED Triage Notes (Signed)
 C/o sorethroat onset yest c/o cough dry  denies anyother complaints. Denies fever

## 2024-01-23 ENCOUNTER — Emergency Department (HOSPITAL_COMMUNITY)

## 2024-01-23 ENCOUNTER — Emergency Department (HOSPITAL_COMMUNITY)
Admission: EM | Admit: 2024-01-23 | Discharge: 2024-01-23 | Discharge status: Against Medical Advice | Attending: Emergency Medicine | Admitting: Emergency Medicine

## 2024-01-23 ENCOUNTER — Other Ambulatory Visit: Payer: Self-pay

## 2024-01-23 ENCOUNTER — Encounter (HOSPITAL_COMMUNITY): Payer: Self-pay

## 2024-01-23 DIAGNOSIS — R079 Chest pain, unspecified: Secondary | ICD-10-CM | POA: Insufficient documentation

## 2024-01-23 DIAGNOSIS — Z5321 Procedure and treatment not carried out due to patient leaving prior to being seen by health care provider: Secondary | ICD-10-CM | POA: Insufficient documentation

## 2024-01-23 DIAGNOSIS — R0602 Shortness of breath: Secondary | ICD-10-CM | POA: Diagnosis not present

## 2024-01-23 DIAGNOSIS — R0789 Other chest pain: Secondary | ICD-10-CM | POA: Diagnosis not present

## 2024-01-23 DIAGNOSIS — R1013 Epigastric pain: Secondary | ICD-10-CM | POA: Diagnosis not present

## 2024-01-23 LAB — CBC WITH DIFFERENTIAL/PLATELET
Abs Immature Granulocytes: 0.07 K/uL (ref 0.00–0.07)
Basophils Absolute: 0 K/uL (ref 0.0–0.1)
Basophils Relative: 0 %
Eosinophils Absolute: 0.2 K/uL (ref 0.0–0.5)
Eosinophils Relative: 2 %
HCT: 39 % (ref 36.0–46.0)
Hemoglobin: 12.8 g/dL (ref 12.0–15.0)
Immature Granulocytes: 1 %
Lymphocytes Relative: 25 %
Lymphs Abs: 2.6 K/uL (ref 0.7–4.0)
MCH: 30.2 pg (ref 26.0–34.0)
MCHC: 32.8 g/dL (ref 30.0–36.0)
MCV: 92 fL (ref 80.0–100.0)
Monocytes Absolute: 0.6 K/uL (ref 0.1–1.0)
Monocytes Relative: 6 %
Neutro Abs: 7 K/uL (ref 1.7–7.7)
Neutrophils Relative %: 66 %
Platelets: 280 K/uL (ref 150–400)
RBC: 4.24 MIL/uL (ref 3.87–5.11)
RDW: 12.3 % (ref 11.5–15.5)
WBC: 10.5 K/uL (ref 4.0–10.5)
nRBC: 0 % (ref 0.0–0.2)

## 2024-01-23 LAB — COMPREHENSIVE METABOLIC PANEL WITH GFR
ALT: 13 U/L (ref 0–44)
AST: 19 U/L (ref 15–41)
Albumin: 4.2 g/dL (ref 3.5–5.0)
Alkaline Phosphatase: 43 U/L (ref 38–126)
Anion gap: 10 (ref 5–15)
BUN: 9 mg/dL (ref 6–20)
CO2: 23 mmol/L (ref 22–32)
Calcium: 9.3 mg/dL (ref 8.9–10.3)
Chloride: 104 mmol/L (ref 98–111)
Creatinine, Ser: 0.87 mg/dL (ref 0.44–1.00)
GFR, Estimated: >60 mL/min (ref >60)
Glucose, Bld: 109 mg/dL — ABNORMAL HIGH (ref 70–99)
Potassium: 4.2 mmol/L (ref 3.5–5.1)
Sodium: 137 mmol/L (ref 135–145)
Total Bilirubin: 0.8 mg/dL (ref 0.0–1.2)
Total Protein: 7.7 g/dL (ref 6.5–8.1)

## 2024-01-23 LAB — POC URINE PREG, ED: Preg Test, Ur: NEGATIVE

## 2024-01-23 LAB — TROPONIN I (HIGH SENSITIVITY): Troponin I (High Sensitivity): 3 ng/L (ref <18)

## 2024-01-23 NOTE — ED Provider Triage Note (Signed)
 Emergency Medicine Provider Triage Evaluation Note  Jock LITTIE Newness , a 28 y.o. female  was evaluated in triage.  Pt complains of wanting test results. Patient still having chest pain. Left earlier after being seen but has now returned. States that chest pain is slightly improved but still there.  Review of Systems  Positive: Chest pain Negative:   Physical Exam  BP 104/60 (BP Location: Right Arm)   Pulse 63   Temp 97.6 F (36.4 C) (Oral)   Resp 18   LMP 12/13/2023 (Approximate)   SpO2 99%  Gen:   Awake, no distress   Resp:  Normal effort  MSK:   Moves extremities without difficulty  Other:    Medical Decision Making  Medically screening exam initiated at 9:45 PM.  Appropriate orders placed.  Jock LITTIE Newness was informed that the remainder of the evaluation will be completed by another provider, this initial triage assessment does not replace that evaluation, and the importance of remaining in the ED until their evaluation is complete.  Discussed lab work with patient, explained that if chest pain was still occurring could not discharge patient from triage. Patient understanding of this and states that she would like to stay for further ED evaluation. Reassuring lab work based off of lab work from earlier. Explained that I am the triage provider and patient would have to wait and be seen once roomed, patient understanding.    Chelsa Stout F, PA-C 01/23/24 2148

## 2024-01-23 NOTE — ED Notes (Signed)
 Pt left ER, wait too long

## 2024-01-23 NOTE — ED Notes (Signed)
 Pt stats she is no longer going to wait and will go to he primary dr tomorrow

## 2024-01-23 NOTE — ED Provider Triage Note (Signed)
 Emergency Medicine Provider Triage Evaluation Note  Jock LITTIE Newness , a 28 y.o. female  was evaluated in triage.  Pt complains of chest pain that started yesterday. Patient states that she is also slightly short of breath. Denies fever, chills, abdominal pain, nausea, vomiting. Denies hx of blood clot. Denies cardiac hx.   Review of Systems  Positive: Chest pain, shortness of breath Negative: Nausea, vomiting, abdominal pain,   Physical Exam  BP 116/60   Pulse 78   Temp 98.2 F (36.8 C) (Oral)   Resp 18   Ht 5' 3 (1.6 m)   Wt 77.1 kg   LMP 12/13/2023 (Approximate)   SpO2 99%   BMI 30.11 kg/m  Gen:   Awake, no distress   Resp:  Normal effort  MSK:   Moves extremities without difficulty  Other:  No obvious abnormality with ausculation of heart or lungs  Medical Decision Making  Medically screening exam initiated at 2:51 PM.  Appropriate orders placed.  Jock LITTIE Newness was informed that the remainder of the evaluation will be completed by another provider, this initial triage assessment does not replace that evaluation, and the importance of remaining in the ED until their evaluation is complete.  Orders: CBC, CMP, Troponin, CXR, EKG   Janetta Terrall [place], [place] 01/23/24 1453

## 2024-01-23 NOTE — ED Triage Notes (Addendum)
 Patient requesting results of her blood tests done  this afternoon. No complaints / respirations unlabored.

## 2024-01-23 NOTE — ED Triage Notes (Signed)
 Here by POV from home for chest tightness and aching. States [employer] reported wheezing. Denies cough, sob, NVD, fever or other sx. Points to epigastric area. Rates 7/10. Alert, NAD, calm, interactive, resps e/u, speaking in clear complete sentences. Skin W&D. Steady gait.
